# Patient Record
Sex: Female | Born: 1970 | Race: White | Hispanic: No | Marital: Married | Smoking: Current every day smoker
Health system: Southern US, Community
[De-identification: ages and names within clinical notes are randomized; demographics above are authoritative.]

## PROBLEM LIST (undated history)

## (undated) DIAGNOSIS — R569 Unspecified convulsions: Secondary | ICD-10-CM

## (undated) DIAGNOSIS — B019 Varicella without complication: Secondary | ICD-10-CM

## (undated) DIAGNOSIS — F32A Depression, unspecified: Secondary | ICD-10-CM

## (undated) DIAGNOSIS — I2699 Other pulmonary embolism without acute cor pulmonale: Secondary | ICD-10-CM

## (undated) DIAGNOSIS — F419 Anxiety disorder, unspecified: Secondary | ICD-10-CM

## (undated) DIAGNOSIS — I209 Angina pectoris, unspecified: Secondary | ICD-10-CM

## (undated) DIAGNOSIS — I10 Essential (primary) hypertension: Secondary | ICD-10-CM

## (undated) DIAGNOSIS — R197 Diarrhea, unspecified: Secondary | ICD-10-CM

## (undated) DIAGNOSIS — R0600 Dyspnea, unspecified: Secondary | ICD-10-CM

## (undated) DIAGNOSIS — R519 Headache, unspecified: Secondary | ICD-10-CM

## (undated) DIAGNOSIS — K760 Fatty (change of) liver, not elsewhere classified: Secondary | ICD-10-CM

## (undated) DIAGNOSIS — C801 Malignant (primary) neoplasm, unspecified: Secondary | ICD-10-CM

## (undated) DIAGNOSIS — E042 Nontoxic multinodular goiter: Secondary | ICD-10-CM

## (undated) DIAGNOSIS — R51 Headache: Secondary | ICD-10-CM

## (undated) DIAGNOSIS — F329 Major depressive disorder, single episode, unspecified: Secondary | ICD-10-CM

## (undated) DIAGNOSIS — K8012 Calculus of gallbladder with acute and chronic cholecystitis without obstruction: Secondary | ICD-10-CM

## (undated) DIAGNOSIS — K589 Irritable bowel syndrome without diarrhea: Secondary | ICD-10-CM

## (undated) HISTORY — DX: Depression, unspecified: F32.A

## (undated) HISTORY — DX: Headache: R51

## (undated) HISTORY — DX: Varicella without complication: B01.9

## (undated) HISTORY — DX: Headache, unspecified: R51.9

## (undated) HISTORY — DX: Major depressive disorder, single episode, unspecified: F32.9

## (undated) HISTORY — DX: Essential (primary) hypertension: I10

## (undated) HISTORY — PX: WISDOM TOOTH EXTRACTION: SHX21

## (undated) HISTORY — PX: MASTECTOMY: SHX3

---

## 2010-12-11 DIAGNOSIS — R202 Paresthesia of skin: Secondary | ICD-10-CM | POA: Insufficient documentation

## 2011-04-24 DIAGNOSIS — F419 Anxiety disorder, unspecified: Secondary | ICD-10-CM | POA: Insufficient documentation

## 2013-05-10 DIAGNOSIS — F410 Panic disorder [episodic paroxysmal anxiety] without agoraphobia: Secondary | ICD-10-CM | POA: Insufficient documentation

## 2013-05-10 LAB — CBC AND DIFFERENTIAL
HCT: 39 % (ref 36–46)
HCT: 39 % (ref 36–46)
HEMATOCRIT: 39 % (ref 36–46)
Hemoglobin: 13.3 g/dL (ref 12.0–16.0)
Hemoglobin: 13.3 g/dL (ref 12.0–16.0)
Hemoglobin: 13.3 g/dL (ref 12.0–16.0)
PLATELETS: 213 10*3/uL (ref 150–399)
Platelets: 213 10*3/uL (ref 150–399)
Platelets: 213 10*3/uL (ref 150–399)

## 2013-05-10 LAB — BASIC METABOLIC PANEL
BUN: 14 mg/dL (ref 4–21)
Creatinine: 0.8 mg/dL (ref 0.5–1.1)
Glucose: 74 mg/dL
Potassium: 4.5 mmol/L (ref 3.4–5.3)
Sodium: 142 mmol/L (ref 137–147)

## 2013-05-10 LAB — LIPID PANEL
LDl/HDL Ratio: 8.3
LDl/HDL Ratio: 8.3

## 2013-05-10 LAB — TSH: TSH: 3.89 u[IU]/mL (ref 0.41–5.90)

## 2013-05-10 LAB — HEPATIC FUNCTION PANEL
ALK PHOS: 49 U/L (ref 25–125)
ALT: 9 U/L (ref 7–35)
AST: 12 U/L — AB (ref 13–35)
Bilirubin, Total: 0.2 mg/dL

## 2013-05-11 DIAGNOSIS — Z8742 Personal history of other diseases of the female genital tract: Secondary | ICD-10-CM | POA: Insufficient documentation

## 2013-05-11 DIAGNOSIS — Z87898 Personal history of other specified conditions: Secondary | ICD-10-CM | POA: Insufficient documentation

## 2013-05-31 LAB — HM PAP SMEAR: HM PAP: NORMAL

## 2013-05-31 LAB — HM MAMMOGRAPHY: HM Mammogram: NORMAL

## 2013-06-29 DIAGNOSIS — N939 Abnormal uterine and vaginal bleeding, unspecified: Secondary | ICD-10-CM | POA: Insufficient documentation

## 2013-08-03 LAB — CBC AND DIFFERENTIAL
HEMATOCRIT: 46 % (ref 36–46)
Hemoglobin: 14.9 g/dL (ref 12.0–16.0)
PLATELETS: 236 10*3/uL (ref 150–399)
WBC: 9.4 10^3/mL

## 2013-08-03 LAB — BASIC METABOLIC PANEL
BUN: 11 mg/dL (ref 4–21)
Glucose: 63 mg/dL
POTASSIUM: 4.5 mmol/L (ref 3.4–5.3)
Sodium: 140 mmol/L (ref 137–147)

## 2013-08-03 LAB — HEPATIC FUNCTION PANEL
ALT: 50 U/L — AB (ref 7–35)
AST: 32 U/L (ref 13–35)
Alkaline Phosphatase: 57 U/L (ref 25–125)
Bilirubin, Total: 0.6 mg/dL

## 2013-08-03 LAB — LIPID PANEL
CHOLESTEROL: 168 mg/dL (ref 0–200)
HDL: 45 mg/dL (ref 35–70)
LDL CALC: 110 mg/dL
TRIGLYCERIDES: 67 mg/dL (ref 40–160)

## 2013-10-16 ENCOUNTER — Emergency Department: Payer: Self-pay | Admitting: Emergency Medicine

## 2014-06-01 ENCOUNTER — Telehealth: Payer: Self-pay | Admitting: Nurse Practitioner

## 2014-06-01 ENCOUNTER — Ambulatory Visit (INDEPENDENT_AMBULATORY_CARE_PROVIDER_SITE_OTHER): Payer: BLUE CROSS/BLUE SHIELD | Admitting: Nurse Practitioner

## 2014-06-01 ENCOUNTER — Encounter: Payer: Self-pay | Admitting: Nurse Practitioner

## 2014-06-01 ENCOUNTER — Encounter (INDEPENDENT_AMBULATORY_CARE_PROVIDER_SITE_OTHER): Payer: Self-pay

## 2014-06-01 VITALS — BP 142/90 | HR 61 | Temp 97.4°F | Resp 14 | Ht 68.0 in | Wt 209.1 lb

## 2014-06-01 DIAGNOSIS — I10 Essential (primary) hypertension: Secondary | ICD-10-CM | POA: Diagnosis not present

## 2014-06-01 DIAGNOSIS — Z720 Tobacco use: Secondary | ICD-10-CM | POA: Diagnosis not present

## 2014-06-01 DIAGNOSIS — F329 Major depressive disorder, single episode, unspecified: Secondary | ICD-10-CM | POA: Diagnosis not present

## 2014-06-01 DIAGNOSIS — F32A Depression, unspecified: Secondary | ICD-10-CM

## 2014-06-01 DIAGNOSIS — F17200 Nicotine dependence, unspecified, uncomplicated: Secondary | ICD-10-CM

## 2014-06-01 LAB — TSH: TSH: 1.89 u[IU]/mL (ref 0.35–4.50)

## 2014-06-01 LAB — COMPREHENSIVE METABOLIC PANEL
ALBUMIN: 4.3 g/dL (ref 3.5–5.2)
ALT: 15 U/L (ref 0–35)
AST: 15 U/L (ref 0–37)
Alkaline Phosphatase: 47 U/L (ref 39–117)
BUN: 7 mg/dL (ref 6–23)
CO2: 26 mEq/L (ref 19–32)
Calcium: 9.6 mg/dL (ref 8.4–10.5)
Chloride: 105 mEq/L (ref 96–112)
Creatinine, Ser: 0.83 mg/dL (ref 0.40–1.20)
GFR: 79.8 mL/min (ref >60.00)
Glucose, Bld: 81 mg/dL (ref 70–99)
Potassium: 4.1 mEq/L (ref 3.5–5.1)
Sodium: 136 mEq/L (ref 135–145)
TOTAL PROTEIN: 7 g/dL (ref 6.0–8.3)
Total Bilirubin: 0.4 mg/dL (ref 0.2–1.2)

## 2014-06-01 LAB — CBC WITH DIFFERENTIAL/PLATELET
BASOS PCT: 0.3 % (ref 0.0–3.0)
Basophils Absolute: 0 10*3/uL (ref 0.0–0.1)
EOS ABS: 0.1 10*3/uL (ref 0.0–0.7)
EOS PCT: 1.3 % (ref 0.0–5.0)
HCT: 40.8 % (ref 36.0–46.0)
HEMOGLOBIN: 13.9 g/dL (ref 12.0–15.0)
LYMPHS PCT: 27.4 % (ref 12.0–46.0)
Lymphs Abs: 2.2 10*3/uL (ref 0.7–4.0)
MCHC: 34.1 g/dL (ref 30.0–36.0)
MCV: 86.7 fl (ref 78.0–100.0)
MONO ABS: 0.5 10*3/uL (ref 0.1–1.0)
Monocytes Relative: 5.9 % (ref 3.0–12.0)
NEUTROS ABS: 5.1 10*3/uL (ref 1.4–7.7)
NEUTROS PCT: 65.1 % (ref 43.0–77.0)
Platelets: 228 10*3/uL (ref 150.0–400.0)
RBC: 4.7 Mil/uL (ref 3.87–5.11)
RDW: 14.3 % (ref 11.5–15.5)
WBC: 7.9 10*3/uL (ref 4.0–10.5)

## 2014-06-01 LAB — HEMOGLOBIN A1C: Hgb A1c MFr Bld: 5.3 % (ref 4.6–6.5)

## 2014-06-01 MED ORDER — NICOTINE POLACRILEX 4 MG MT GUM: 4.0000 mg | CHEWING_GUM | OROMUCOSAL | Status: DC | PRN

## 2014-06-01 MED ORDER — HYDROCHLOROTHIAZIDE 12.5 MG PO TABS: 12.5000 mg | ORAL_TABLET | Freq: Every day | ORAL | Status: DC

## 2014-06-01 NOTE — Progress Notes (Signed)
Pre visit review using our clinic review tool, if applicable. No additional management support is needed unless otherwise documented below in the visit note. 

## 2014-06-01 NOTE — Patient Instructions (Signed)
Nice to meet you. Welcome to [facility]!   We will follow up in 1 month.  Let me know if they give you a hard time about the gum to stop smoking.   Please visit the lab before leaving today.

## 2014-06-01 NOTE — Progress Notes (Signed)
Subjective:    Patient ID: Melissa Shepherd, female    DOB: May 30, 1970, 44 y.o.   MRN: [id number]  HPI  Melissa Shepherd is a 44 yo female establishing care and CC of HTN.   1) Health Maintenance-   Diet- Eats at home and out, drinking more water   Exercise- No formal   Immunizations- UTD  Mammogram- Last year pt reports, Keeping an eye on a cyst    MGM- Breast cancer  Paternal Aunt- breast cancer   Sister- Cervical cancer   Pap- 2015   Eye Exam- Not UTD  Dental Exam- UTD  2) Chronic Problems-  Depression- Psychiatrist- Latuda, Zoloft- depression, Clonazepam. Sees psych in [place], every 3 months.   Tobacco Use- Stopped during pregnancy, tried slowing down, not a candidate for   3) Acute Problems-  HTN- [facility]- 158/98, never had problems previously or been on medications.   Review of Systems  Constitutional: Negative for fever, chills, diaphoresis and fatigue.  HENT: Negative for tinnitus and trouble swallowing.   Eyes: Negative for visual disturbance.  Respiratory: Negative for chest tightness, shortness of breath and wheezing.   Cardiovascular: Negative for chest pain, palpitations and leg swelling.  Gastrointestinal: Negative for nausea, vomiting and diarrhea.  Genitourinary: Negative for difficulty urinating.  Musculoskeletal: Negative for back pain and neck pain.  Skin: Negative for rash.  Neurological: Negative for dizziness, weakness, numbness and headaches.  Psychiatric/Behavioral: Negative for suicidal ideas and sleep disturbance. The patient is not nervous/anxious.    Past Medical History  Diagnosis Date  . Depression   . Chicken pox   . Frequent headaches   . Hypertension   . Migraine     History   Social History  . Marital Status: Single    Spouse Name: N/A  . Number of Children: N/A  . Years of Education: N/A   Occupational History  . Not on file.   Social History Main Topics  . Smoking status: Current Every Day Smoker -- 1.00 packs/day    Types: Cigarettes  . Smokeless tobacco: Never Used  . Alcohol Use: No  . Drug Use: No  . Sexual Activity: Not Currently   Other Topics Concern  . 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 Cesarean section  1991. 2003.    2    Family History  Problem Relation Age of Onset  . Hypertension Mother   . Diabetes Son   . Cancer Maternal Grandmother     Breast  . Mental retardation Maternal Grandmother     No Known Allergies  No current outpatient prescriptions on file prior to visit.   No current facility-administered medications on file prior to visit.      Objective:   Physical Exam  Constitutional: She is oriented to person, place, and time. She appears well-developed and well-nourished. No distress.  BP 142/90 mmHg  Pulse 61  Temp(Src) 97.4 F (36.3 C) (Oral)  Resp 14  Ht 5\' 8"  (1.727 m)  Wt 209 lb 1.9 oz (94.856 kg)  BMI 31.80 kg/m2  SpO2 97%  LMP 05/27/2014 (Approximate)   HENT:  Head: Normocephalic and atraumatic.  Right Ear: External ear normal.  Left Ear: External ear normal.  Cardiovascular: Normal rate, regular rhythm, normal heart sounds and intact distal pulses.  Exam reveals no gallop and no friction rub.   No murmur heard. Pulmonary/Chest: Effort normal and breath sounds normal. No respiratory distress. She has no wheezes. She has no rales. She exhibits no tenderness.  Neurological: She is alert and oriented to person, place, and time. No cranial nerve deficit. She exhibits normal muscle tone. Coordination normal.  Skin: Skin is warm and dry. No rash noted. She is not diaphoretic.  Psychiatric: She has a normal mood and affect. Her behavior is normal. Judgment and thought content normal.        Assessment & Plan:

## 2014-06-01 NOTE — Telephone Encounter (Signed)
emmi mailed  °

## 2014-06-02 ENCOUNTER — Telehealth: Payer: Self-pay | Admitting: Nurse Practitioner

## 2014-06-02 NOTE — Telephone Encounter (Signed)
emmi emailed °

## 2014-06-11 DIAGNOSIS — I10 Essential (primary) hypertension: Secondary | ICD-10-CM | POA: Insufficient documentation

## 2014-06-11 DIAGNOSIS — F17200 Nicotine dependence, unspecified, uncomplicated: Secondary | ICD-10-CM | POA: Insufficient documentation

## 2014-06-11 DIAGNOSIS — F329 Major depressive disorder, single episode, unspecified: Secondary | ICD-10-CM | POA: Insufficient documentation

## 2014-06-11 DIAGNOSIS — F32A Depression, unspecified: Secondary | ICD-10-CM | POA: Insufficient documentation

## 2014-06-11 NOTE — Assessment & Plan Note (Addendum)
Will start on hctz 12.5 mg daily. FU in 1 month. Will obtain CMET baseline. Since she is a new pt will obtain cbc w/ diff, a1c and tsh.

## 2014-06-11 NOTE — Assessment & Plan Note (Signed)
Continue on Latuda, Klonopin, and zoloft. Stable and seeing psychiatry in [facility]

## 2014-06-11 NOTE — Assessment & Plan Note (Signed)
Pt wants to try the gum. I will send it in and asked her if she has any issues to call. Unsure if insurance will cover.

## 2014-06-14 ENCOUNTER — Encounter: Payer: Self-pay | Admitting: Nurse Practitioner

## 2014-06-29 ENCOUNTER — Telehealth: Payer: Self-pay | Admitting: *Deleted

## 2014-06-29 ENCOUNTER — Ambulatory Visit: Payer: BLUE CROSS/BLUE SHIELD | Admitting: Nurse Practitioner

## 2014-06-29 DIAGNOSIS — Z0289 Encounter for other administrative examinations: Secondary | ICD-10-CM

## 2014-06-29 NOTE — Telephone Encounter (Signed)
Okay to schedule pt tomorrow

## 2014-06-29 NOTE — Telephone Encounter (Signed)
Pt scheduled and notified

## 2014-06-30 ENCOUNTER — Ambulatory Visit (INDEPENDENT_AMBULATORY_CARE_PROVIDER_SITE_OTHER): Payer: BLUE CROSS/BLUE SHIELD | Admitting: Nurse Practitioner

## 2014-06-30 ENCOUNTER — Encounter: Payer: Self-pay | Admitting: Nurse Practitioner

## 2014-06-30 VITALS — BP 122/78 | HR 67 | Temp 97.7°F | Resp 14 | Ht 68.0 in | Wt 211.4 lb

## 2014-06-30 DIAGNOSIS — F329 Major depressive disorder, single episode, unspecified: Secondary | ICD-10-CM | POA: Diagnosis not present

## 2014-06-30 DIAGNOSIS — R079 Chest pain, unspecified: Secondary | ICD-10-CM | POA: Diagnosis not present

## 2014-06-30 DIAGNOSIS — I10 Essential (primary) hypertension: Secondary | ICD-10-CM

## 2014-06-30 DIAGNOSIS — M7989 Other specified soft tissue disorders: Secondary | ICD-10-CM | POA: Diagnosis not present

## 2014-06-30 DIAGNOSIS — F32A Depression, unspecified: Secondary | ICD-10-CM

## 2014-06-30 MED ORDER — FUROSEMIDE 20 MG PO TABS: 20.0000 mg | ORAL_TABLET | Freq: Every day | ORAL | Status: DC

## 2014-06-30 MED ORDER — NICOTINE 21 MG/24HR TD PT24: 21.0000 mg | MEDICATED_PATCH | Freq: Every day | TRANSDERMAL | Status: DC

## 2014-06-30 NOTE — Patient Instructions (Signed)
Follow up in 3 months. Take lasix every 3rd day (Thurs, skip Fri and Sat, take Sun, skip mon and tue take wed ect.Marland KitchenMarland Kitchen

## 2014-06-30 NOTE — Progress Notes (Signed)
   Subjective:    Patient ID: Melissa Shepherd, female    DOB: Jul 25, 1970, 44 y.o.   MRN: [id number]  HPI  Melissa Shepherd is a 44 yo female with a follow up for HTN, depression, tobacco use, and SOB.   1) HTN- good highest 147/88 at home 2) Depression- doing well  3) Tobacco use  Chest pain- felt like a band of pain across ribs Friday and Saturday camping and 1 episode end of March First time 15 min, Friday 30 min, Sat 30 min  Review of Systems  Constitutional: Negative for fever, chills, diaphoresis and fatigue.  Respiratory: Negative for chest tightness, shortness of breath and wheezing.   Cardiovascular: Positive for chest pain. Negative for palpitations and leg swelling.       Intermittent 2 episodes  Gastrointestinal: Negative for nausea, vomiting and diarrhea.  Skin: Negative for rash.  Neurological: Negative for dizziness, weakness, numbness and headaches.  Psychiatric/Behavioral: The patient is not nervous/anxious.        Objective:   Physical Exam  Constitutional: She is oriented to person, place, and time. She appears well-developed and well-nourished. No distress.  BP 122/78 mmHg  Pulse 67  Temp(Src) 97.7 F (36.5 C) (Oral)  Resp 14  Ht 5\' 8"  (1.727 m)  Wt 211 lb 6.4 oz (95.89 kg)  BMI 32.15 kg/m2  SpO2 98%  LMP 05/27/2014 (Approximate)   HENT:  Head: Normocephalic and atraumatic.  Right Ear: External ear normal.  Left Ear: External ear normal.  Cardiovascular: Normal rate, regular rhythm, normal heart sounds and intact distal pulses.  Exam reveals no gallop and no friction rub.   No murmur heard. Pulmonary/Chest: Effort normal and breath sounds normal. No respiratory distress. She has no wheezes. She has no rales. She exhibits no tenderness.  Neurological: She is alert and oriented to person, place, and time. No cranial nerve deficit. She exhibits normal muscle tone. Coordination normal.  Skin: Skin is warm and dry. No rash noted. She is not diaphoretic.    Psychiatric: She has a normal mood and affect. Her behavior is normal. Judgment and thought content normal.      Assessment & Plan:

## 2014-06-30 NOTE — Progress Notes (Signed)
Pre visit review using our clinic review tool, if applicable. No additional management support is needed unless otherwise documented below in the visit note. 

## 2014-07-08 ENCOUNTER — Ambulatory Visit: Payer: BLUE CROSS/BLUE SHIELD | Admitting: Nurse Practitioner

## 2014-07-09 DIAGNOSIS — R079 Chest pain, unspecified: Secondary | ICD-10-CM | POA: Insufficient documentation

## 2014-07-09 DIAGNOSIS — M7989 Other specified soft tissue disorders: Secondary | ICD-10-CM | POA: Insufficient documentation

## 2014-07-09 NOTE — Assessment & Plan Note (Signed)
Stable. Continue medication regimen.

## 2014-07-09 NOTE — Assessment & Plan Note (Signed)
Stable. Continue regimen. 

## 2014-07-09 NOTE — Assessment & Plan Note (Signed)
Try Lasix 20 mg Tuesdays and Thursdays. Pt did not want to take every 3rd day.

## 2014-07-09 NOTE — Assessment & Plan Note (Signed)
Referring to Cardiology for further work up.

## 2014-07-27 ENCOUNTER — Other Ambulatory Visit: Payer: Self-pay | Admitting: Nurse Practitioner

## 2014-08-02 ENCOUNTER — Ambulatory Visit: Payer: BLUE CROSS/BLUE SHIELD | Admitting: Cardiovascular Disease

## 2014-09-02 ENCOUNTER — Encounter (INDEPENDENT_AMBULATORY_CARE_PROVIDER_SITE_OTHER): Payer: Self-pay

## 2014-09-02 ENCOUNTER — Ambulatory Visit (INDEPENDENT_AMBULATORY_CARE_PROVIDER_SITE_OTHER): Payer: BLUE CROSS/BLUE SHIELD | Admitting: Cardiovascular Disease

## 2014-09-02 ENCOUNTER — Encounter: Payer: Self-pay | Admitting: Cardiovascular Disease

## 2014-09-02 VITALS — BP 118/88 | HR 68 | Ht 67.0 in | Wt 211.0 lb

## 2014-09-02 DIAGNOSIS — R0602 Shortness of breath: Secondary | ICD-10-CM | POA: Diagnosis not present

## 2014-09-02 DIAGNOSIS — R079 Chest pain, unspecified: Secondary | ICD-10-CM

## 2014-09-02 DIAGNOSIS — I10 Essential (primary) hypertension: Secondary | ICD-10-CM

## 2014-09-02 NOTE — Progress Notes (Signed)
HPI  This is a 44 year old Caucasian female who was referred for evaluation of chest pain. She has no previous cardiac history. She has known history of recurrent chest pain. She underwent previous cardiac evaluation at [facility]. She had a stress echocardiogram in 2012 which was normal. He continued to have chest pain and underwent CT angiogram of the coronary arteries in 2013 which showed normal coronary arteries with a calcium score of 0. She has chronic medical conditions that include hypertension which required treatment recently with hydrochlorothiazide, tobacco use since the age of 56, GERD, anxiety and previous panic attacks. She experienced 5 episodes of chest pain recently. All of them happened at rest. It felt like a heavy band around her breasts. It was associated with shortness of breath. There was no radiation. Most of these episodes last anywhere from 10-30 minutes. She feels very anxious during these episodes. There is no family history of premature coronary artery disease.  No Known Allergies   Current Outpatient Prescriptions on File Prior to Visit  Medication Sig Dispense Refill  . clonazePAM (KLONOPIN) 0.5 MG tablet Take 0.5 mg by mouth. As needed.  No more than 4 times daily.  1  . furosemide (LASIX) 20 MG tablet Take 1 tablet (20 mg total) by mouth daily. (Patient taking differently: Take 20 mg by mouth 2 (two) times a week. ) 30 tablet 0  . hydrochlorothiazide (HYDRODIURIL) 12.5 MG tablet TAKE 1 TABLET (12.5 MG TOTAL) BY MOUTH DAILY. 30 tablet 5  . sertraline (ZOLOFT) 50 MG tablet Take 50 mg by mouth daily.     No current facility-administered medications on file prior to visit.     Past Medical History  Diagnosis Date  . Depression   . Chicken pox   . Frequent headaches   . Hypertension   . Migraine      Past Surgical History  Procedure Laterality Date  . Cesarean section  1991. 2003.    2     Family History  Problem Relation Age of Onset    . Hypertension Mother   . Diabetes Son   . Cancer Maternal Grandmother     Breast  . Mental retardation Maternal Grandmother      History   Social History  . Marital Status: Single    Spouse Name: N/A  . Number of Children: N/A  . Years of Education: N/A   Occupational History  . Not on file.   Social History Main Topics  . Smoking status: Current Every Day Smoker -- 1.00 packs/day    Types: Cigarettes  . Smokeless tobacco: Never Used  . Alcohol Use: 0.0 oz/week    0 Standard drinks or equivalent per week  . Drug Use: No  . Sexual Activity: Not Currently   Other Topics Concern  . 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 It is negative other than that mentioned in the history of present illness.    PHYSICAL EXAM   BP 118/88 mmHg  Pulse 68  Ht 5\' 7"  (1.702 m)  Wt 211 lb (95.709 kg)  BMI 33.04 kg/m2 Constitutional: She is oriented to person, place,  and time. She appears well-developed and well-nourished. No distress.  HENT: No nasal discharge.  Head: Normocephalic and atraumatic.  Eyes: Pupils are equal and round. No discharge.  Neck: Normal range of motion. Neck supple. No JVD present. No thyromegaly present.  Cardiovascular: Normal rate, regular rhythm, normal heart sounds. Exam reveals no gallop and no friction rub. No murmur heard.  Pulmonary/Chest: Effort normal and breath sounds normal. No stridor. No respiratory distress. She has no wheezes. She has no rales. She exhibits no tenderness.  Abdominal: Soft. Bowel sounds are normal. She exhibits no distension. There is no tenderness. There is no rebound and no guarding.  Musculoskeletal: Normal range of motion. She exhibits no edema and no tenderness.  Neurological: She is  alert and oriented to person, place, and time. Coordination normal.  Skin: Skin is warm and dry. No rash noted. She is not diaphoretic. No erythema. No pallor.  Psychiatric: She has a normal mood and affect. Her behavior is normal. Judgment and thought content normal.     EKG: Sinus  Rhythm  Low voltage in precordial leads.   ABNORMAL    ASSESSMENT AND PLAN

## 2014-09-02 NOTE — Assessment & Plan Note (Signed)
Blood pressure is controlled on hydrochlorothiazide. 

## 2014-09-02 NOTE — Assessment & Plan Note (Signed)
The chest pain is overall atypical and likely due to stress and anxiety. She had previous cardiac workup most recently in 2013 at [place] as outlined above. Thus, the chance of progression in his is very low. Nonetheless, she has risk factors that include hypertension and tobacco use. Thus, I requested a treadmill stress test for evaluation. If this comes back unremarkable, no further cardiac workup is recommended.

## 2014-09-02 NOTE — Patient Instructions (Signed)
Medication Instructions:  Your physician recommends that you continue on your current medications as directed. Please refer to the Current Medication list given to you today.   Labwork: none  Testing/Procedures: Your physician has requested that you have an exercise tolerance test.     Follow-Up: Your physician recommends that you schedule a follow-up appointment with Dr. Fletcher Anon as needed.    Any Other Special Instructions Will Be Listed Below (If Applicable).  Exercise Stress Electrocardiogram An exercise stress electrocardiogram is a test to check how blood flows to your heart. It is done to find areas of poor blood flow. You will need to walk on a treadmill for this test. The electrocardiogram will record your heartbeat when you are at rest and when you are exercising. BEFORE THE PROCEDURE  Do not have drinks with caffeine or foods with caffeine for 24 hours before the test, or as told by your doctor. This includes coffee, tea (even decaf tea), sodas, chocolate, and cocoa.  Follow your doctor's instructions about eating and drinking before the test.  Ask your doctor what medicines you should or should not take before the test. Take your medicines with water unless told by your doctor not to.  If you use an inhaler, bring it with you to the test.  Bring a snack to eat after the test.  Do not  smoke for 4 hours before the test.  Do not put lotions, powders, creams, or oils on your chest before the test.  Wear comfortable shoes and clothing. PROCEDURE  You will have patches put on your chest. Small areas of your chest may need to be shaved. Wires will be connected to the patches.  Your heart rate will be watched while you are resting and while you are exercising.  You will walk on the treadmill. The treadmill will slowly get faster to raise your heart rate.  The test will take about 1-2 hours. AFTER THE PROCEDURE  Your heart rate and blood pressure will be watched after  the test.  You may return to your normal diet, activities, and medicines or as told by your doctor. Document Released: 07/31/2007 Document Revised: 06/28/2013 Document Reviewed: 10/19/2012 [facility] Patient Information 2015 [place], [place]. This information is not intended to replace advice given to you by your health care provider. Make sure you discuss any questions you have with your health care provider.

## 2014-09-08 ENCOUNTER — Ambulatory Visit (INDEPENDENT_AMBULATORY_CARE_PROVIDER_SITE_OTHER): Payer: BLUE CROSS/BLUE SHIELD

## 2014-09-08 DIAGNOSIS — R0602 Shortness of breath: Secondary | ICD-10-CM | POA: Diagnosis not present

## 2014-09-08 NOTE — Patient Instructions (Signed)
Your stress was normal  Call or return to clinic prn if these symptoms worsen or fail to improve as anticipated.

## 2014-09-09 LAB — EXERCISE TOLERANCE TEST
Estimated workload: 7.2 METS
Exercise duration (min): 6 min
Exercise duration (sec): 9 s
Peak HR: 146 {beats}/min
Percent HR: 82 %
Rest HR: 88 {beats}/min

## 2014-09-30 ENCOUNTER — Telehealth: Payer: Self-pay | Admitting: Nurse Practitioner

## 2014-09-30 ENCOUNTER — Ambulatory Visit (INDEPENDENT_AMBULATORY_CARE_PROVIDER_SITE_OTHER): Payer: BLUE CROSS/BLUE SHIELD | Admitting: Nurse Practitioner

## 2014-09-30 VITALS — BP 122/80 | HR 61 | Temp 98.4°F | Resp 14 | Ht 67.0 in | Wt 213.2 lb

## 2014-09-30 DIAGNOSIS — M545 Low back pain: Secondary | ICD-10-CM

## 2014-09-30 DIAGNOSIS — I10 Essential (primary) hypertension: Secondary | ICD-10-CM | POA: Diagnosis not present

## 2014-09-30 DIAGNOSIS — M7989 Other specified soft tissue disorders: Secondary | ICD-10-CM | POA: Diagnosis not present

## 2014-09-30 DIAGNOSIS — M546 Pain in thoracic spine: Secondary | ICD-10-CM | POA: Diagnosis not present

## 2014-09-30 MED ORDER — FUROSEMIDE 20 MG PO TABS: 20.0000 mg | ORAL_TABLET | ORAL | Status: DC

## 2014-09-30 NOTE — Progress Notes (Signed)
Pre visit review using our clinic review tool, if applicable. No additional management support is needed unless otherwise documented below in the visit note. 

## 2014-09-30 NOTE — Telephone Encounter (Signed)
Pt called to cancel appt for today. Pt forgot about appt and is currently out of town. Please advise to cancel appt/msn

## 2014-09-30 NOTE — Telephone Encounter (Signed)
Pt ended up coming in for her appt

## 2014-09-30 NOTE — Progress Notes (Signed)
'Patient ID: Melissa Shepherd, female    DOB: [birth year]  Age: 44 y.o. MRN: [id number]  CC: Follow-up   HPI Andria Head presents for a follow up of HTN and back pain.   1) Not watching salt intake, not taking lasix recently, pt reports the medication is working for her. She reports that   2) Pt quickly mentioned back pain before finishing our visit. She reports she has mid to low back pain, bilaterally, denies sciatica.   History Meriem has a past medical history of Depression; Chicken pox; Frequent headaches; Hypertension; and Migraine.   She has past surgical history that includes Cesarean section (1991. 2003.).   Her family history includes Cancer in her maternal grandmother; Diabetes in her son; Hypertension in her mother; Mental retardation in her maternal grandmother.She reports that she has been smoking Cigarettes.  She has been smoking about 1.00 pack per day. She has never used smokeless tobacco. She reports that she drinks alcohol. She reports that she does not use illicit drugs.  Outpatient Prescriptions Prior to Visit  Medication Sig Dispense Refill  . clonazePAM (KLONOPIN) 0.5 MG tablet Take 0.5 mg by mouth. As needed.  No more than 4 times daily.  1  . hydrochlorothiazide (HYDRODIURIL) 12.5 MG tablet TAKE 1 TABLET (12.5 MG TOTAL) BY MOUTH DAILY. 30 tablet 5  . Lurasidone HCl (LATUDA) 60 MG TABS Take by mouth daily.    . sertraline (ZOLOFT) 50 MG tablet Take 50 mg by mouth daily.    . furosemide (LASIX) 20 MG tablet Take 1 tablet (20 mg total) by mouth daily. (Patient taking differently: Take 20 mg by mouth 2 (two) times a week. ) 30 tablet 0   No facility-administered medications prior to visit.    ROS Review of Systems  Constitutional: Negative for fever, chills, diaphoresis and fatigue.  Respiratory: Negative for chest tightness, shortness of breath and wheezing.   Cardiovascular: Negative for chest pain, palpitations and leg swelling.  Gastrointestinal: Negative for  nausea, vomiting and diarrhea.  Musculoskeletal: Positive for back pain.  Skin: Negative for rash.  Neurological: Negative for dizziness, weakness, numbness and headaches.       Denies losing bowel/bladder function, saddle anesthesia, or weakness of extremities.   Psychiatric/Behavioral: The patient is not nervous/anxious.     Objective:  BP 122/80 mmHg  Pulse 61  Temp(Src) 98.4 F (36.9 C)  Resp 14  Ht 5\' 7"  (1.702 m)  Wt 213 lb 3.2 oz (96.707 kg)  BMI 33.38 kg/m2  SpO2 97%  Physical Exam  Constitutional: She is oriented to person, place, and time. She appears well-developed and well-nourished. No distress.  HENT:  Head: Normocephalic and atraumatic.  Right Ear: External ear normal.  Left Ear: External ear normal.  Cardiovascular: Normal rate, regular rhythm and normal heart sounds.  Exam reveals no gallop and no friction rub.   No murmur heard. Pulmonary/Chest: Effort normal and breath sounds normal. No respiratory distress. She has no wheezes. She has no rales. She exhibits no tenderness.  Musculoskeletal: Normal range of motion. She exhibits tenderness. She exhibits no edema.  Tender mid to low back paraspinal muscles. Right worse than left in tightness  Neurological: She is alert and oriented to person, place, and time. No cranial nerve deficit. She exhibits normal muscle tone. Coordination normal.  Skin: Skin is warm and dry. No rash noted. She is not diaphoretic.  Psychiatric: She has a normal mood and affect. Her behavior is normal. Judgment and thought content normal.  Assessment & Plan:   Nel was seen today for follow-up.  Diagnoses and all orders for this visit:  Bilateral low back pain, with sciatica presence unspecified -     DG Lumbar Spine 2-3 Views; Future  Bilateral thoracic back pain -     DG Thoracic Spine 2 View; Future  Other orders -     furosemide (LASIX) 20 MG tablet; Take 1 tablet (20 mg total) by mouth 2 (two) times a week.   I have  changed Ms. Avetisyan's furosemide. I am also having her maintain her sertraline, clonazePAM, hydrochlorothiazide, and Lurasidone HCl.  Meds ordered this encounter  Medications  . furosemide (LASIX) 20 MG tablet    Sig: Take 1 tablet (20 mg total) by mouth 2 (two) times a week.    Dispense:  30 tablet    Refill:  0    Order Specific Question:  Supervising Provider    Answer:  Crecencio Mc [[id number]]     Follow-up: Return in about 4 weeks (around 10/28/2014) for Back pain .

## 2014-09-30 NOTE — Patient Instructions (Signed)
Your Lasix was refilled.   Your back x-rays were ordered for [facility].   [place] at [facility] Address:  Address: [address], [place], [place] [address]  [phone] X-rays until 4 pm. You can go as your schedule allows.   Back Pain, Adult Low back pain is very common. About 1 in 5 people have back pain.The cause of low back pain is rarely dangerous. The pain often gets better over time.About half of people with a sudden onset of back pain feel better in just 2 weeks. About 8 in 10 people feel better by 6 weeks.  CAUSES Some common causes of back pain include:  Strain of the muscles or ligaments supporting the spine.  Wear and tear (degeneration) of the spinal discs.  Arthritis.  Direct injury to the back. DIAGNOSIS Most of the time, the direct cause of low back pain is not known.However, back pain can be treated effectively even when the exact cause of the pain is unknown.Answering your caregiver's questions about your overall health and symptoms is one of the most accurate ways to make sure the cause of your pain is not dangerous. If your caregiver needs more information, he or she may order lab work or imaging tests (X-rays or MRIs).However, even if imaging tests show changes in your back, this usually does not require surgery. HOME CARE INSTRUCTIONS For many people, back pain returns.Since low back pain is rarely dangerous, it is often a condition that people can learn to [facility] their own.   Remain active. It is stressful on the back to sit or stand in one place. Do not sit, drive, or stand in one place for more than 30 minutes at a time. Take short walks on level surfaces as soon as pain allows.Try to increase the length of time you walk each day.  Do not stay in bed.Resting more than 1 or 2 days can delay your recovery.  Do not avoid exercise or work.Your body is made to move.It is not dangerous to be active, even though your  back may hurt.Your back will likely heal faster if you return to being active before your pain is gone.  Pay attention to your body when you bend and lift. Many people have less discomfortwhen lifting if they bend their knees, keep the load close to their bodies,and avoid twisting. Often, the most comfortable positions are those that put less stress on your recovering back.  Find a comfortable position to sleep. Use a firm mattress and lie on your side with your knees slightly bent. If you lie on your back, put a pillow under your knees.  Only take over-the-counter or prescription medicines as directed by your caregiver. Over-the-counter medicines to reduce pain and inflammation are often the most helpful.Your caregiver may prescribe muscle relaxant drugs.These medicines help dull your pain so you can more quickly return to your normal activities and healthy exercise.  Put ice on the injured area.  Put ice in a plastic bag.  Place a towel between your skin and the bag.  Leave the ice on for 15-20 minutes, 03-04 times a day for the first 2 to 3 days. After that, ice and heat may be alternated to reduce pain and spasms.  Ask your caregiver about trying back exercises and gentle massage. This may be of some benefit.  Avoid feeling anxious or stressed.Stress increases muscle tension and can worsen back pain.It is important to recognize when you are anxious or stressed  and learn ways to manage it.Exercise is a great option. SEEK MEDICAL CARE IF:  You have pain that is not relieved with rest or medicine.  You have pain that does not improve in 1 week.  You have new symptoms.  You are generally not feeling well. SEEK IMMEDIATE MEDICAL CARE IF:   You have pain that radiates from your back into your legs.  You develop new bowel or bladder control problems.  You have unusual weakness or numbness in your arms or legs.  You develop nausea or vomiting.  You develop abdominal  pain.  You feel faint. Document Released: 02/11/2005 Document Revised: 08/13/2011 Document Reviewed: 06/15/2013 [facility] Patient Information 2015 [place], [place]. This information is not intended to replace advice given to you by your health care provider. Make sure you discuss any questions you have with your health care provider.

## 2014-10-06 ENCOUNTER — Encounter: Payer: Self-pay | Admitting: Nurse Practitioner

## 2014-10-06 DIAGNOSIS — M545 Low back pain, unspecified: Secondary | ICD-10-CM | POA: Insufficient documentation

## 2014-10-06 DIAGNOSIS — M546 Pain in thoracic spine: Secondary | ICD-10-CM | POA: Insufficient documentation

## 2014-10-06 NOTE — Assessment & Plan Note (Signed)
Pt would like refill of lasix. Taking twice weekly if she remembers.

## 2014-10-06 NOTE — Assessment & Plan Note (Addendum)
Pt was very adamant about getting x-rays. Pt reports her pain goes up to her bra strap approx. She was tender to palpation, no decrease in ROM. Time constraints limited our exam time today. Will follow up in 4 weeks or after x-rays.

## 2014-10-06 NOTE — Assessment & Plan Note (Signed)
BP Readings from Last 3 Encounters:  09/30/14 122/80  09/02/14 118/88  06/30/14 122/78   Controlled. Sees Dr. Fletcher Anon.

## 2014-10-06 NOTE — Assessment & Plan Note (Signed)
Pt very adamant about getting x-rays. She reports this has been a big issue in the past with her back. She reports bulging discs in the past. Will obtain thoracic and lumbar x-rays. Will follow.

## 2014-10-24 ENCOUNTER — Ambulatory Visit (INDEPENDENT_AMBULATORY_CARE_PROVIDER_SITE_OTHER)
Admission: RE | Admit: 2014-10-24 | Discharge: 2014-10-24 | Disposition: A | Discharge status: Home / Self Care | Payer: BLUE CROSS/BLUE SHIELD | Source: Ambulatory Visit | Attending: Nurse Practitioner | Admitting: Nurse Practitioner

## 2014-10-24 DIAGNOSIS — M545 Low back pain: Secondary | ICD-10-CM | POA: Diagnosis not present

## 2014-10-24 DIAGNOSIS — M546 Pain in thoracic spine: Secondary | ICD-10-CM

## 2014-10-26 ENCOUNTER — Other Ambulatory Visit: Payer: Self-pay | Admitting: Nurse Practitioner

## 2014-10-26 DIAGNOSIS — M546 Pain in thoracic spine: Secondary | ICD-10-CM

## 2014-11-03 ENCOUNTER — Other Ambulatory Visit: Payer: BLUE CROSS/BLUE SHIELD

## 2014-11-04 ENCOUNTER — Ambulatory Visit: Admission: RE | Admit: 2014-11-04 | Payer: BLUE CROSS/BLUE SHIELD | Source: Ambulatory Visit

## 2015-01-24 ENCOUNTER — Other Ambulatory Visit: Payer: Self-pay | Admitting: Nurse Practitioner

## 2015-04-26 ENCOUNTER — Encounter: Payer: Self-pay | Admitting: Nurse Practitioner

## 2015-04-26 ENCOUNTER — Ambulatory Visit (INDEPENDENT_AMBULATORY_CARE_PROVIDER_SITE_OTHER): Payer: BLUE CROSS/BLUE SHIELD | Admitting: Nurse Practitioner

## 2015-04-26 VITALS — BP 122/80 | HR 56 | Temp 98.0°F | Ht 67.0 in | Wt 226.8 lb

## 2015-04-26 DIAGNOSIS — I10 Essential (primary) hypertension: Secondary | ICD-10-CM | POA: Diagnosis not present

## 2015-04-26 DIAGNOSIS — M7989 Other specified soft tissue disorders: Secondary | ICD-10-CM

## 2015-04-26 DIAGNOSIS — K589 Irritable bowel syndrome without diarrhea: Secondary | ICD-10-CM

## 2015-04-26 LAB — CBC WITH DIFFERENTIAL/PLATELET
Basophils Absolute: 0.1 10*3/uL (ref 0.0–0.1)
Basophils Relative: 1.4 % (ref 0.0–3.0)
Eosinophils Absolute: 0.1 10*3/uL (ref 0.0–0.7)
Eosinophils Relative: 1.3 % (ref 0.0–5.0)
HEMATOCRIT: 38.9 % (ref 36.0–46.0)
HEMOGLOBIN: 13.3 g/dL (ref 12.0–15.0)
LYMPHS ABS: 2.2 10*3/uL (ref 0.7–4.0)
Lymphocytes Relative: 32.1 % (ref 12.0–46.0)
MCHC: 34.3 g/dL (ref 30.0–36.0)
MCV: 89.1 fl (ref 78.0–100.0)
Monocytes Absolute: 0.5 10*3/uL (ref 0.1–1.0)
Monocytes Relative: 7.2 % (ref 3.0–12.0)
Neutro Abs: 4 10*3/uL (ref 1.4–7.7)
Neutrophils Relative %: 58 % (ref 43.0–77.0)
Platelets: 217 10*3/uL (ref 150.0–400.0)
RBC: 4.37 Mil/uL (ref 3.87–5.11)
RDW: 15.9 % — AB (ref 11.5–15.5)
WBC: 6.9 10*3/uL (ref 4.0–10.5)

## 2015-04-26 LAB — COMPREHENSIVE METABOLIC PANEL
ALK PHOS: 44 U/L (ref 39–117)
ALT: 24 U/L (ref 0–35)
AST: 20 U/L (ref 0–37)
Albumin: 4.5 g/dL (ref 3.5–5.2)
BUN: 7 mg/dL (ref 6–23)
CO2: 26 mEq/L (ref 19–32)
Calcium: 9.3 mg/dL (ref 8.4–10.5)
Chloride: 105 mEq/L (ref 96–112)
Creatinine, Ser: 0.77 mg/dL (ref 0.40–1.20)
GFR: 86.65 mL/min (ref >60.00)
Glucose, Bld: 83 mg/dL (ref 70–99)
POTASSIUM: 3.6 meq/L (ref 3.5–5.1)
SODIUM: 139 meq/L (ref 135–145)
TOTAL PROTEIN: 6.9 g/dL (ref 6.0–8.3)
Total Bilirubin: 0.3 mg/dL (ref 0.2–1.2)

## 2015-04-26 LAB — LIPID PANEL
CHOLESTEROL: 227 mg/dL — AB (ref 0–200)
HDL: 51.6 mg/dL (ref >39.00)
NONHDL: 174.93
TRIGLYCERIDES: 206 mg/dL — AB (ref 0.0–149.0)
Total CHOL/HDL Ratio: 4
VLDL: 41.2 mg/dL — ABNORMAL HIGH (ref 0.0–40.0)

## 2015-04-26 LAB — HEMOGLOBIN A1C: HEMOGLOBIN A1C: 5.4 % (ref 4.6–6.5)

## 2015-04-26 LAB — LDL CHOLESTEROL, DIRECT: Direct LDL: 147 mg/dL

## 2015-04-26 NOTE — Patient Instructions (Addendum)
Please follow up with the nutritionist when you get scheduled.   Work on diet modification  Get Zantac 150 mg daily over the counter and take once in the mornings with the imodium AD.

## 2015-04-26 NOTE — Progress Notes (Signed)
Patient ID: Roger Vitorino, female    DOB: 19-Oct-1970  Age: 45 y.o. MRN: [id number]  CC: Acute Visit   HPI Shuntia Copas presents for CC of BP issues, weight concern, and IBS.   1) IBS- Self diagnosed  Diarrhea- since she was young  Has go to suddenly and sometimes doesn't make it  1 month worsening  5 x this month sudden onset of diarrhea, loose Maternal Aunt- has same issues   Does not wake up out of sleep to do this  Caffeine- coffee in the morning, 1 soda daily  Imodium- has not used   2) BP- issues 149/88 only when going to drug stores  Home BP cuff broke  Takes BP medication in the morning   Alarm symptoms: Wt loss, rectal bleeding, anemia, family h/o celiac disease, and nocturnal symptoms DENIES ALL  1) 7 mos 13 lbs up  Pt is drinking mountain dew  Eating baked ziti, chex mix, and eats usually once daily with snacking in between.  Pt feels like her legs swell bilaterally from knee down  History Noleen has a past medical history of Depression; Chicken pox; Frequent headaches; Hypertension; and Migraine.   She has past surgical history that includes Cesarean section (1991. 2003.).   Her family history includes Cancer in her maternal grandmother; Diabetes in her son; Hypertension in her mother; Mental retardation in her maternal grandmother.She reports that she has been smoking Cigarettes.  She has been smoking about 1.00 pack per day. She has never used smokeless tobacco. She reports that she drinks alcohol. She reports that she does not use illicit drugs.  Outpatient Prescriptions Prior to Visit  Medication Sig Dispense Refill  . clonazePAM (KLONOPIN) 0.5 MG tablet Take 0.5 mg by mouth. As needed.  No more than 4 times daily.  1  . hydrochlorothiazide (HYDRODIURIL) 12.5 MG tablet TAKE 1 TABLET (12.5 MG TOTAL) BY MOUTH DAILY. 30 tablet 5  . Lurasidone HCl (LATUDA) 60 MG TABS Take by mouth daily.    . sertraline (ZOLOFT) 50 MG tablet Take 50 mg by mouth daily.    . furosemide  (LASIX) 20 MG tablet Take 1 tablet (20 mg total) by mouth 2 (two) times a week. 30 tablet 0   No facility-administered medications prior to visit.    ROS Review of Systems  Constitutional: Negative for fever, chills, diaphoresis and fatigue.  Respiratory: Negative for chest tightness, shortness of breath and wheezing.   Cardiovascular: Negative for chest pain, palpitations and leg swelling.  Gastrointestinal: Positive for diarrhea. Negative for nausea, vomiting, abdominal pain and abdominal distention.  Skin: Negative for rash.  Neurological: Negative for dizziness, weakness, numbness and headaches.  Psychiatric/Behavioral: Negative for suicidal ideas and sleep disturbance. The patient is not nervous/anxious.     Objective:  BP 122/80 mmHg  Pulse 56  Temp(Src) 98 F (36.7 C) (Oral)  Ht 5\' 7"  (1.702 m)  Wt 226 lb 12 oz (102.853 kg)  BMI 35.51 kg/m2  SpO2 97%  Physical Exam  Constitutional: She is oriented to person, place, and time. She appears well-developed and well-nourished. No distress.  HENT:  Head: Normocephalic and atraumatic.  Right Ear: External ear normal.  Left Ear: External ear normal.  Eyes: Right eye exhibits no discharge. Left eye exhibits no discharge. No scleral icterus.  Cardiovascular: Normal rate, regular rhythm and normal heart sounds.  Exam reveals no gallop and no friction rub.   No murmur heard. Pulmonary/Chest: Effort normal and breath sounds normal. No respiratory distress. She has  no wheezes. She has no rales. She exhibits no tenderness.  Abdominal: Soft. Bowel sounds are normal. She exhibits no distension and no mass. There is no tenderness. There is no rebound and no guarding.  Obese abdomen  Musculoskeletal: Normal range of motion. She exhibits no edema or tenderness.  No edema noticed in bilateral LE   Neurological: She is alert and oriented to person, place, and time. No cranial nerve deficit. She exhibits normal muscle tone. Coordination  normal.  Skin: Skin is warm and dry. No rash noted. She is not diaphoretic.  Psychiatric: Her behavior is normal. Judgment and thought content normal.  Flat affect    Assessment & Plan:   Kaytelyn was seen today for acute visit.  Diagnoses and all orders for this visit:  IBS (irritable bowel syndrome) -     Amb ref to Medical Nutrition Therapy-MNT -     Comprehensive metabolic panel -     Hemoglobin A1c -     CBC w/Diff -     Vitamin D (25 hydroxy) -     Lipid panel  Essential hypertension -     Comprehensive metabolic panel -     Hemoglobin A1c -     CBC w/Diff -     Vitamin D (25 hydroxy) -     Lipid panel  Leg swelling -     Comprehensive metabolic panel -     Hemoglobin A1c -     CBC w/Diff -     Vitamin D (25 hydroxy) -     Lipid panel  Other orders -     LDL cholesterol, direct   I have discontinued Ms. Narvaez's furosemide. I am also having her maintain her sertraline, clonazePAM, Lurasidone HCl, and hydrochlorothiazide.  No orders of the defined types were placed in this encounter.     Follow-up: Return in about 3 months (around 07/27/2015) for Follow up .

## 2015-04-27 LAB — VITAMIN D 25 HYDROXY (VIT D DEFICIENCY, FRACTURES): VITD: 18.98 ng/mL — ABNORMAL LOW (ref 30.00–100.00)

## 2015-05-01 DIAGNOSIS — K589 Irritable bowel syndrome without diarrhea: Secondary | ICD-10-CM | POA: Insufficient documentation

## 2015-05-01 NOTE — Assessment & Plan Note (Addendum)
Checking labs today New problem to me No edema at this time Advised increase in water intake

## 2015-05-01 NOTE — Assessment & Plan Note (Addendum)
Gave FODMAPS diet handout  New problem to me Obtain labs Advised to try Zantac and Imodium OTC Referral to nutrition therapy

## 2015-05-01 NOTE — Assessment & Plan Note (Signed)
BP Readings from Last 3 Encounters:  04/26/15 122/80  09/30/14 122/80  09/02/14 118/88   Stable. Continue current regimen- buy a cuff

## 2015-06-05 ENCOUNTER — Ambulatory Visit: Payer: BLUE CROSS/BLUE SHIELD | Admitting: Dietician

## 2015-07-31 ENCOUNTER — Ambulatory Visit: Payer: BLUE CROSS/BLUE SHIELD | Admitting: Nurse Practitioner

## 2015-08-18 ENCOUNTER — Encounter: Payer: Self-pay | Admitting: Family Medicine

## 2015-08-18 ENCOUNTER — Ambulatory Visit (INDEPENDENT_AMBULATORY_CARE_PROVIDER_SITE_OTHER): Payer: BLUE CROSS/BLUE SHIELD | Admitting: Family Medicine

## 2015-08-18 VITALS — BP 112/84 | HR 71 | Temp 97.9°F | Ht 67.0 in | Wt 223.8 lb

## 2015-08-18 DIAGNOSIS — F419 Anxiety disorder, unspecified: Secondary | ICD-10-CM

## 2015-08-18 DIAGNOSIS — I10 Essential (primary) hypertension: Secondary | ICD-10-CM

## 2015-08-18 DIAGNOSIS — E559 Vitamin D deficiency, unspecified: Secondary | ICD-10-CM | POA: Diagnosis not present

## 2015-08-18 DIAGNOSIS — E785 Hyperlipidemia, unspecified: Secondary | ICD-10-CM

## 2015-08-18 DIAGNOSIS — R079 Chest pain, unspecified: Secondary | ICD-10-CM | POA: Diagnosis not present

## 2015-08-18 DIAGNOSIS — E782 Mixed hyperlipidemia: Secondary | ICD-10-CM | POA: Insufficient documentation

## 2015-08-18 LAB — COMPREHENSIVE METABOLIC PANEL
ALT: 25 U/L (ref 0–35)
AST: 19 U/L (ref 0–37)
Albumin: 4.4 g/dL (ref 3.5–5.2)
Alkaline Phosphatase: 45 U/L (ref 39–117)
BUN: 10 mg/dL (ref 6–23)
CHLORIDE: 103 meq/L (ref 96–112)
CO2: 30 mEq/L (ref 19–32)
Calcium: 9.6 mg/dL (ref 8.4–10.5)
Creatinine, Ser: 0.7 mg/dL (ref 0.40–1.20)
GFR: 96.59 mL/min (ref >60.00)
GLUCOSE: 93 mg/dL (ref 70–99)
POTASSIUM: 3.5 meq/L (ref 3.5–5.1)
Sodium: 140 mEq/L (ref 135–145)
Total Bilirubin: 0.4 mg/dL (ref 0.2–1.2)
Total Protein: 6.7 g/dL (ref 6.0–8.3)

## 2015-08-18 NOTE — Assessment & Plan Note (Signed)
Recheck vitamin D level 

## 2015-08-18 NOTE — Progress Notes (Signed)
Patient ID: Melissa Shepherd, female   DOB: [birth year], 45 y.o.   MRN: [id number]  [name], MD Phone: [phone]  Melissa Shepherd is a 45 y.o. female who presents today for follow-up.  HYPERTENSION Disease Monitoring Home BP Monitoring checking at home, over the last week reports range 146-158/102-114 though at the end of the visit she does report oftentimes in the 140s over 90s at home over the last week. Chest pain- yes, see below    Dyspnea- yes, see below Medications Compliance-  taking HCTZ.  Edema- see below  Patient reports for a long time she has felt as though she needs take a deep breath and as though her breath is going to go away though this occurs infrequently currently. Lasts for about 3 seconds and goes away on its own. She has other unrelated episodes of chest discomfort that feels like somebody is sitting on her chest. Just hurts. No associated diaphoresis or shortness of breath or exertional component. She had a stress test July of last year that was negative. She underwent significant evaluation at [facility] that was unremarkable as well for cardiac cause. Further review of the cardiology noted was felt to be likely related to anxiety. Patient is followed by psychiatrist who did write a note wondering if the patient would benefit from a beta blocker given her anxiety.  Hyperlipidemia: High last time it was checked. She's currently on latuda. Not currently on cholesterol medication. Not in a risk-benefit category per ASCVD guidelines.  Has been taking vitamin D supplements thousand units for the last 3 months.  PMH: Smoker   ROS see history of present illness  Objective  Physical Exam Filed Vitals:   08/18/15 1328  BP: 112/84  Pulse: 71  Temp: 97.9 F (36.6 C)    BP Readings from Last 3 Encounters:  08/18/15 112/84  04/26/15 122/80  09/30/14 122/80   Wt Readings from Last 3 Encounters:  08/18/15 223 lb 12.8 oz (101.515 kg)  04/26/15 226 lb 12 oz (102.853  kg)  09/30/14 213 lb 3.2 oz (96.707 kg)    Physical Exam  Constitutional: She is well-developed, well-nourished, and in no distress.  HENT:  Head: Normocephalic and atraumatic.  Right Ear: External ear normal.  Left Ear: External ear normal.  Eyes: Conjunctivae are normal. Pupils are equal, round, and reactive to light.  Cardiovascular: Normal rate, regular rhythm and normal heart sounds.   Pulmonary/Chest: Effort normal and breath sounds normal.  Neurological: She is alert. Gait normal.  Skin: Skin is warm and dry. She is not diaphoretic.   EKG: Sinus bradycardia, rate 59, T-wave inversion in lead 3, no other ST or T-wave changes  Assessment/Plan: Please see individual problem list.  Essential hypertension At goal in the office. Reported blood pressures at home are out of goal range. Blood pressures have been in normal range in the office when checked recently. Quite discordant blood pressures. Discussed continuing hydrochlorothiazide. Discussed monitoring blood pressures over the weekend at home. If greater than 180/110 she'll seek medical attention. She will follow-up on Monday for blood pressure check with nursing and bring her blood pressure cuff to compare. She was advised of symptoms to seek medical attention for. We'll check a CMP today.  Intermittent chest pain Patient with persistent intermittent chest pain throughout the years. Has had significant workup to cardiology at 2 facilities. Recent stress test unremarkable. EKG today reassuring. Suspect likely related to anxiety and panic attacks for which she is seeing psychiatry. She will continue to  monitor. She's given return precautions.  Anxiety Suspect patient's chest pain symptoms and breathing symptoms are likely related to anxiety. Has had negative cardiac workup in the last year. Patient's psychiatrist wanted to know if beta blocker would be an option to help with blood pressure and treat panic attacks. Given heart rate on  EKG I do not think this is a good option at this time. She will continue to follow with a psychiatrist. She is given return precautions.  Hyperlipidemia Cholesterol mildly elevated. Could be related to her Latuda. Not elevated enough to require medication. She will work on diet.  Vitamin D deficiency Recheck vitamin D level.    Orders Placed This Encounter  Procedures  . Vitamin D (25 hydroxy)  . Comp Met (CMET)  . EKG 12-Lead    [name], MD [place]

## 2015-08-18 NOTE — Assessment & Plan Note (Addendum)
At goal in the office. Reported blood pressures at home are out of goal range. Blood pressures have been in normal range in the office when checked recently. Quite discordant blood pressures. Discussed continuing hydrochlorothiazide. Discussed monitoring blood pressures over the weekend at home. If greater than 180/110 she'll seek medical attention. She will follow-up on Monday for blood pressure check with nursing and bring her blood pressure cuff to compare. She was advised of symptoms to seek medical attention for. We'll check a CMP today.

## 2015-08-18 NOTE — Progress Notes (Signed)
Pre visit review using our clinic review tool, if applicable. No additional management support is needed unless otherwise documented below in the visit note. 

## 2015-08-18 NOTE — Assessment & Plan Note (Signed)
Cholesterol mildly elevated. Could be related to her Latuda. Not elevated enough to require medication. She will work on diet.

## 2015-08-18 NOTE — Patient Instructions (Signed)
Nice to meet you. Please continue to monitor your blood pressure over the weekend. If it is greater than 180/110 please get evaluated. We will have you come back on Monday for a nurse visit to have blood pressure check please bring your blood pressure monitor to this visit. We will check some lab work today. If you develop chest pain, shortness of breath, swelling in her ankles, blurry vision, numbness, weakness, or any new or changing symptoms please seek medical attention.

## 2015-08-18 NOTE — Assessment & Plan Note (Signed)
Patient with persistent intermittent chest pain throughout the years. Has had significant workup to cardiology at 2 facilities. Recent stress test unremarkable. EKG today reassuring. Suspect likely related to anxiety and panic attacks for which she is seeing psychiatry. She will continue to monitor. She's given return precautions.

## 2015-08-18 NOTE — Assessment & Plan Note (Signed)
Suspect patient's chest pain symptoms and breathing symptoms are likely related to anxiety. Has had negative cardiac workup in the last year. Patient's psychiatrist wanted to know if beta blocker would be an option to help with blood pressure and treat panic attacks. Given heart rate on EKG I do not think this is a good option at this time. She will continue to follow with a psychiatrist. She is given return precautions.

## 2015-08-21 LAB — VITAMIN D 25 HYDROXY (VIT D DEFICIENCY, FRACTURES): VITD: 31.96 ng/mL (ref 30.00–100.00)

## 2015-08-22 ENCOUNTER — Encounter: Payer: Self-pay | Admitting: Family Medicine

## 2015-08-22 ENCOUNTER — Ambulatory Visit (INDEPENDENT_AMBULATORY_CARE_PROVIDER_SITE_OTHER): Payer: BLUE CROSS/BLUE SHIELD

## 2015-08-22 VITALS — BP 116/80 | HR 70 | Resp 18

## 2015-08-22 DIAGNOSIS — I10 Essential (primary) hypertension: Secondary | ICD-10-CM | POA: Diagnosis not present

## 2015-08-22 NOTE — Progress Notes (Addendum)
Patient came in for BP check.  See vitals for details.  One week check.    Please advise in Dr. Ellen Henri absence. Thanks  Patient dropped off BP reading, put on her desk for receive. thanks

## 2015-08-22 NOTE — Progress Notes (Signed)
BP well controlled. Management per primary.

## 2015-08-25 ENCOUNTER — Telehealth: Payer: Self-pay | Admitting: Family Medicine

## 2015-08-25 ENCOUNTER — Other Ambulatory Visit: Payer: Self-pay | Admitting: Nurse Practitioner

## 2015-08-25 NOTE — Progress Notes (Signed)
Returned patient call.  No answer.  Left message to call the office back.

## 2015-08-25 NOTE — Telephone Encounter (Signed)
Error

## 2015-08-25 NOTE — Progress Notes (Signed)
Patient called needing a reply regarding her BP readings and BP check on 6.27.17. [phone]

## 2015-08-30 ENCOUNTER — Encounter: Payer: Self-pay | Admitting: Family Medicine

## 2015-08-30 ENCOUNTER — Ambulatory Visit (INDEPENDENT_AMBULATORY_CARE_PROVIDER_SITE_OTHER): Payer: BLUE CROSS/BLUE SHIELD | Admitting: Family Medicine

## 2015-08-30 VITALS — BP 118/72 | HR 89 | Temp 98.0°F | Ht 67.0 in | Wt 219.6 lb

## 2015-08-30 DIAGNOSIS — F32A Depression, unspecified: Secondary | ICD-10-CM

## 2015-08-30 DIAGNOSIS — F329 Major depressive disorder, single episode, unspecified: Secondary | ICD-10-CM

## 2015-08-30 DIAGNOSIS — F419 Anxiety disorder, unspecified: Secondary | ICD-10-CM

## 2015-08-30 DIAGNOSIS — I10 Essential (primary) hypertension: Secondary | ICD-10-CM

## 2015-08-30 NOTE — Assessment & Plan Note (Signed)
Some recent depression. No SI. Following with psychiatry.

## 2015-08-30 NOTE — Assessment & Plan Note (Signed)
Stable. Followed by psychiatry. She'll continue her current medications and follow-up with them. Doubt medication changes recently have contributed to her blood pressure issues.

## 2015-08-30 NOTE — Patient Instructions (Signed)
Nice to see you. Your blood pressure is normal today. I am going to work on trying to get you to see somebody that can do 24-hour blood pressure monitoring to evaluate your blood pressures. You should continue to see her psychiatrist and continue your current medications. If you do not hear something from [place] in the next week regarding blood pressure please let us know. If you develop chest pain, shortness of breath, worsening anxiety, worsening depression, or thoughts of hurting herself please seek medical attention.

## 2015-08-30 NOTE — Progress Notes (Signed)
Pre visit review using our clinic review tool, if applicable. No additional management support is needed unless otherwise documented below in the visit note. 

## 2015-08-30 NOTE — Progress Notes (Signed)
Patient ID: Melissa Shepherd, female   DOB: October 18, 1970, 45 y.o.   MRN: [id number]  [name], MD Phone: [phone]  Melissa Shepherd is a 45 y.o. female who presents today for follow-up.  Hypertension: Patient notes she's been checking at home and blood pressure has been widely variable over the last week. Anywhere from 126/83-161/114. No symptoms when her blood pressure is elevated. No chest pain or shortness of breath. On check today it is in the normal range with my check in the CMA check and then with the patient's blood pressure cuff it is quite different immediately after my check. It is 133/95 with her blood pressure machine and 108/78 with my check.  She does have anxiety and is followed by psychiatrist. Take Xanax now. Was on Klonopin. Wonders if her blood blood pressure is related to this change. Also on latuda and Zoloft. Some depression. No SI.  PMH: Smoker   ROS see history of present illness  Objective  Physical Exam Filed Vitals:   08/30/15 1450  BP: 118/72  Pulse: 89  Temp: 98 F (36.7 C)    BP Readings from Last 3 Encounters:  08/30/15 118/72  08/22/15 116/80  08/18/15 112/84   Wt Readings from Last 3 Encounters:  08/30/15 219 lb 9.6 oz (99.61 kg)  08/18/15 223 lb 12.8 oz (101.515 kg)  04/26/15 226 lb 12 oz (102.853 kg)    Physical Exam  Constitutional: She is well-developed, well-nourished, and in no distress.  HENT:  Head: Normocephalic and atraumatic.  Right Ear: External ear normal.  Left Ear: External ear normal.  Cardiovascular: Normal rate, regular rhythm and normal heart sounds.   Pulmonary/Chest: Effort normal and breath sounds normal.  Musculoskeletal: She exhibits no edema.  Neurological: She is alert. Gait normal.  Skin: Skin is warm and dry. She is not diaphoretic.  Psychiatric:  Mood anxious, affect anxious     Assessment/Plan: Please see individual problem list.  Anxiety Stable. Followed by psychiatry. She'll continue her current  medications and follow-up with them. Doubt medication changes recently have contributed to her blood pressure issues.  Depression Some recent depression. No SI. Following with psychiatry.  Essential hypertension Blood pressure remains at goal today in office on our checks. Pressures checked manually by myself and CMA and noted to be in the normal range. Checked immediately after this with patient's blood pressure monitor from home and quite a difference was noted. Suspect blood pressure monitor at home is not accurate. Advised to look at the instructions to try to calibrate or reset this. We'll also attempt to get patient in for 24-hour blood pressure monitoring if possible to relieve some of her anxiety regarding this. She is advised that if in the she does not hear anything regarding this next week she is to call the office. She is given return precautions.     [name], MD [place]

## 2015-08-30 NOTE — Assessment & Plan Note (Signed)
Blood pressure remains at goal today in office on our checks. Pressures checked manually by myself and CMA and noted to be in the normal range. Checked immediately after this with patient's blood pressure monitor from home and quite a difference was noted. Suspect blood pressure monitor at home is not accurate. Advised to look at the instructions to try to calibrate or reset this. We'll also attempt to get patient in for 24-hour blood pressure monitoring if possible to relieve some of her anxiety regarding this. She is advised that if in the she does not hear anything regarding this next week she is to call the office. She is given return precautions.

## 2015-11-03 ENCOUNTER — Telehealth: Payer: Self-pay | Admitting: Family Medicine

## 2015-11-03 NOTE — Telephone Encounter (Signed)
Patient Name: JAIYA ACEVES  DOB: [birth year]    Initial Comment Caller states been having dizzy spells, vision got blurry   Nurse Assessment  Nurse: Mallie Mussel, RN, Alveta Heimlich Date/Time (Eastern Time): 11/03/2015 2:25:44 PM  Confirm and document reason for call. If symptomatic, describe symptoms. You must click the next button to save text entered. ---Caller states that she has dizzy spells for the past 3 days. They only last a few seconds. She sometimes she leans to the left. She states that when she has that, the room is spinning. Her vision got blurry last night while she was driving and she was dizzy at the time. She denies head injury in the last 3 days. She denies headache.  Has the patient traveled out of the country within the last 30 days? ---No  Does the patient have any new or worsening symptoms? ---Yes  Will a triage be completed? ---Yes  Related visit to physician within the last 2 weeks? ---No  Does the PT have any chronic conditions? (i.e. diabetes, asthma, etc.) ---Yes  List chronic conditions. ---HTN  Is the patient pregnant or possibly pregnant? (Ask all females between the ages of 54-55) ---No  Is this a behavioral health or substance abuse call? ---No     Guidelines    Guideline Title Affirmed Question Affirmed Notes  Dizziness - Lightheadedness [1] MODERATE dizziness (e.g., interferes with normal activities) AND [2] has NOT been evaluated by physician for this (Exception: dizziness caused by heat exposure, sudden standing, or poor fluid intake)    Final Disposition User   See Physician within 24 Hours Mallie Mussel, RN, Alveta Heimlich    Comments  No appointments available at [facility]. I offered to check at one of the [facility], but she declined. She will go to an UC to be seen. Does not know the name of it.   Referrals  REFERRED TO PCP OFFICE   Disagree/Comply: Comply

## 2015-11-06 NOTE — Telephone Encounter (Signed)
FYI

## 2015-12-04 ENCOUNTER — Ambulatory Visit: Payer: BLUE CROSS/BLUE SHIELD | Admitting: Family Medicine

## 2016-03-15 ENCOUNTER — Other Ambulatory Visit: Payer: Self-pay | Admitting: Family Medicine

## 2016-04-03 ENCOUNTER — Telehealth: Payer: Self-pay | Admitting: Family Medicine

## 2016-04-03 ENCOUNTER — Encounter: Payer: Self-pay | Admitting: Emergency Medicine

## 2016-04-03 ENCOUNTER — Emergency Department
Admission: EM | Admit: 2016-04-03 | Discharge: 2016-04-04 | Disposition: A | Discharge status: Home / Self Care | Payer: BLUE CROSS/BLUE SHIELD | Attending: Emergency Medicine | Admitting: Emergency Medicine

## 2016-04-03 ENCOUNTER — Emergency Department: Payer: BLUE CROSS/BLUE SHIELD

## 2016-04-03 DIAGNOSIS — I10 Essential (primary) hypertension: Secondary | ICD-10-CM | POA: Insufficient documentation

## 2016-04-03 DIAGNOSIS — Z79899 Other long term (current) drug therapy: Secondary | ICD-10-CM | POA: Diagnosis not present

## 2016-04-03 DIAGNOSIS — F17210 Nicotine dependence, cigarettes, uncomplicated: Secondary | ICD-10-CM | POA: Insufficient documentation

## 2016-04-03 DIAGNOSIS — M7989 Other specified soft tissue disorders: Secondary | ICD-10-CM | POA: Diagnosis present

## 2016-04-03 DIAGNOSIS — R0789 Other chest pain: Secondary | ICD-10-CM | POA: Diagnosis not present

## 2016-04-03 DIAGNOSIS — R609 Edema, unspecified: Secondary | ICD-10-CM | POA: Diagnosis not present

## 2016-04-03 DIAGNOSIS — R0602 Shortness of breath: Secondary | ICD-10-CM | POA: Insufficient documentation

## 2016-04-03 DIAGNOSIS — R079 Chest pain, unspecified: Secondary | ICD-10-CM

## 2016-04-03 LAB — BASIC METABOLIC PANEL
ANION GAP: 6 (ref 5–15)
BUN: 8 mg/dL (ref 6–20)
CALCIUM: 9.2 mg/dL (ref 8.9–10.3)
CO2: 28 mmol/L (ref 22–32)
CREATININE: 0.8 mg/dL (ref 0.44–1.00)
Chloride: 103 mmol/L (ref 101–111)
GFR calc Af Amer: >60 mL/min (ref >60)
GFR calc non Af Amer: >60 mL/min (ref >60)
GLUCOSE: 93 mg/dL (ref 65–99)
Potassium: 3.2 mmol/L — ABNORMAL LOW (ref 3.5–5.1)
Sodium: 137 mmol/L (ref 135–145)

## 2016-04-03 LAB — CBC
HCT: 36.8 % (ref 35.0–47.0)
HEMOGLOBIN: 12.8 g/dL (ref 12.0–16.0)
MCH: 30.6 pg (ref 26.0–34.0)
MCHC: 34.8 g/dL (ref 32.0–36.0)
MCV: 88.1 fL (ref 80.0–100.0)
Platelets: 249 10*3/uL (ref 150–440)
RBC: 4.17 MIL/uL (ref 3.80–5.20)
RDW: 14.2 % (ref 11.5–14.5)
WBC: 7.4 10*3/uL (ref 3.6–11.0)

## 2016-04-03 LAB — BRAIN NATRIURETIC PEPTIDE: B Natriuretic Peptide: 57 pg/mL (ref 0.0–100.0)

## 2016-04-03 LAB — TROPONIN I

## 2016-04-03 NOTE — Telephone Encounter (Signed)
fyi

## 2016-04-03 NOTE — Telephone Encounter (Signed)
Please contact patient and see if she is planning on being evaluated today. I would suggest urgent care or walking clinic. Thanks.

## 2016-04-03 NOTE — ED Triage Notes (Signed)
Pt with bilateral lower leg swelling with pitting edema. Pt taking lasix at this time but has never been dx with CHF.

## 2016-04-03 NOTE — Discharge Instructions (Signed)
You have been seen in the emergency department today for chest pain. Your workup has shown normal results. As we discussed please follow-up with your primary care physician in the next 1-2 days for recheck. Return to the emergency department for any further chest pain, trouble breathing, or any other symptom personally concerning to yourself.  Please call the number provided for cardiology tomorrow morning to arrange a follow-up appointment as soon as possible.

## 2016-04-03 NOTE — Telephone Encounter (Signed)
[facility]  Patient Name: Melissa Shepherd  DOB: [birth year]    Initial Comment Caller states her legs are really swollen.    Nurse Assessment  Nurse: Harlow Mares, RN, Suanne Marker Date/Time (Eastern Time): 04/03/2016 3:47:23 PM  Confirm and document reason for call. If symptomatic, describe symptoms. ---Caller states her legs are really swollen. Both legs, have been swollen for a couple of weeks. Stomach is bloated. Had some problems breathing. Had to close work and take a break due to chest pain. No chest pain or SOB today.  Does the patient have any new or worsening symptoms? ---Yes  Will a triage be completed? ---Yes  Related visit to physician within the last 2 weeks? ---No  Does the PT have any chronic conditions? (i.e. diabetes, asthma, etc.) ---Yes  List chronic conditions. ---anxiety; HTN; prn Lasix;  Is the patient pregnant or possibly pregnant? (Ask all females between the ages of 29-55) ---No  Is this a behavioral health or substance abuse call? ---No     Guidelines    Guideline Title Affirmed Question Affirmed Notes  Leg Swelling and Edema SEVERE leg swelling (e.g., swelling extends above knee, entire leg is swollen, weeping fluid)    Final Disposition User   See Physician within 4 Hours (or PCP triage) Harlow Mares, RN, Rhonda    Referrals  GO TO [place] UNDECIDED   Disagree/Comply: Comply

## 2016-04-03 NOTE — Telephone Encounter (Signed)
Patient states she is going to the ER

## 2016-04-03 NOTE — Telephone Encounter (Signed)
Pt called about having pressure in her chest yesterday. And both legs are swollen down to feet and stomach swollen. No nausea, Fatigue. Pt is going to be transferred to [facility]. Please advise?  Call pt @ [phone]. Thank you!

## 2016-04-03 NOTE — Telephone Encounter (Signed)
See [facility] triage note. Will follow.

## 2016-04-03 NOTE — ED Notes (Signed)
Pt reports swelling of lower legs and feet for several weeks.  Pt taking hctz and lasix prn.   Pt had episode of chest pain at work yesterday, no pain now   No sob. cig smoker.  No cough   No fever .  Pt alert speech clear   Family with pt.   Skin warm anddry   nsr on monitor.

## 2016-04-03 NOTE — ED Notes (Signed)
Report off to iris rn  

## 2016-04-03 NOTE — ED Provider Notes (Signed)
[facility] Emergency Department Provider Note  Time seen: 11:06 PM  I have reviewed the triage vital signs and the nursing notes.   HISTORY  Chief Complaint Leg Swelling    HPI Melissa Shepherd is a 46 y.o. female with a past medical history of peripheral edema, hypertension, presents the emergency department with chest pain and leg swelling. According to the patient for the past several years she has had mild lower extremity swelling for which she intermittently takes Lasix. She states over the past one year she has now had 3 episodes of chest pain which she describes as sudden sharp pain in the center of her chest. The last episode occurred yesterday lasted for about 30-45 minutes before resolving very quickly. Denies nausea or diaphoresis does state some shortness of breath. She also states during the episode she was trying to make herself belched to feel better she had to have an episode of diarrhea and then felt better shortly afterwards. Patient states he feels like her legs are more swollen over the past one month. She states she has had chest pain before and she has had a stress test approximately one year ago. She also states she has had a CT of her coronaries performed in the past and was told that her heart looked very healthy. Currently the patient denies any symptoms at this time. States she told a coworker about her episode yesterday which is what caused her to come to the ER today.  Past Medical History:  Diagnosis Date  . Chicken pox   . Depression   . Frequent headaches   . Hypertension   . Migraine     Patient Active Problem List   Diagnosis Date Noted  . Anxiety 08/18/2015  . Hyperlipidemia 08/18/2015  . Vitamin D deficiency 08/18/2015  . IBS (irritable bowel syndrome) 05/01/2015  . Lumbago 10/06/2014  . Bilateral thoracic back pain 10/06/2014  . Intermittent chest pain 07/09/2014  . Leg swelling 07/09/2014  . Essential hypertension 06/11/2014   . Depression 06/11/2014  . Tobacco use disorder 06/11/2014    Past Surgical History:  Procedure Laterality Date  . [place]. 2003.   2    Prior to Admission medications   Medication Sig Start Date End Date Taking? Authorizing Provider  ALPRAZolam Duanne Moron) 1 MG tablet  08/14/15   Historical Provider, MD  clonazePAM (KLONOPIN) 0.5 MG tablet Take 0.5 mg by mouth. Reported on 08/18/2015 05/18/14   Historical Provider, MD  hydrochlorothiazide (HYDRODIURIL) 12.5 MG tablet TAKE 1 TABLET (12.5 MG TOTAL) BY MOUTH DAILY. 03/15/16   Leone Haven, MD  Lurasidone HCl (LATUDA) 60 MG TABS Take 20 mg by mouth daily.     Historical Provider, MD  sertraline (ZOLOFT) 50 MG tablet Take 100 mg by mouth daily.     Historical Provider, MD    No Known Allergies  Family History  Problem Relation Age of Onset  . Hypertension Mother   . Diabetes Son   . Cancer Maternal Grandmother     Breast  . Mental retardation Maternal Grandmother     Social History Social History  Substance Use Topics  . Smoking status: Current Every Day Smoker    Packs/day: 1.00    Types: Cigarettes  . Smokeless tobacco: Never Used  . Alcohol use 0.0 oz/week    Review of Systems Constitutional: Negative for fever. Cardiovascular: Intermittent chest pain over the past 1 year, 3 episodes. Respiratory: Mild shortness breath with the chest pain, gone.  Gastrointestinal: Negative for abdominal pain. One episode of diarrhea yesterday. Neurological: Negative for headache 10-point ROS otherwise negative.  ____________________________________________   PHYSICAL EXAM:  VITAL SIGNS: ED Triage Vitals  Enc Vitals Group     BP 04/03/16 1738 (!) 146/94     Pulse Rate 04/03/16 1738 93     Resp 04/03/16 1738 18     Temp 04/03/16 1738 98.3 F (36.8 C)     Temp Source 04/03/16 1738 Oral     SpO2 04/03/16 1738 98 %     Weight 04/03/16 1741 230 lb (104.3 kg)     Height --      Head Circumference --      Peak  Flow --      Pain Score --      Pain Loc --      Pain Edu? --      Excl. in [place]? --     Constitutional: Alert and oriented. Well appearing and in no distress. Eyes: Normal exam ENT   Head: Normocephalic and atraumatic.   Mouth/Throat: Mucous membranes are moist. Cardiovascular: Normal rate, regular rhythm. No murmur Respiratory: Normal respiratory effort without tachypnea nor retractions. Breath sounds are clear  Gastrointestinal: Soft and nontender. No distention.   Musculoskeletal: 1+ lower extremity edema, equal bilaterally. 2+ DP pulses, neurovascular intact. Neurologic:  Normal speech and language. No gross focal neurologic deficits Skin:  Skin is warm, dry and intact.  Psychiatric: Mood and affect are normal.  ____________________________________________    EKG  EKG reviewed and interpreted by my social is normal sinus rhythm at 85 bpm, narrow QRS, normal axis, normal intervals, no concerning ST changes. Normal EKG.  ____________________________________________    RADIOLOGY  Chest x-ray shows a mild amount of vascular congestion.  ____________________________________________   INITIAL IMPRESSION / ASSESSMENT AND PLAN / ED COURSE  Pertinent labs & imaging results that were available during my care of the patient were reviewed by me and considered in my medical decision making (see chart for details).  Patient presents the emergency department with intermittent chest discomfort over the past 1 year, 3 episodes in total nail. Patient also states mild shortness breath with the event. She states fairly chronic peripheral edema but worse over the past one month. Patient takes Lasix at home intermittently. I discussed with the patient the need to follow-up with a cardiologist for an echocardiogram. Patient states she had a stress test last year as well as a recent CT scan of her coronaries. She states she does not have a regular cardiologist that she sees. Patient is  somewhat hypertensive in the emergency department 173/108. Patient is taking her medications, this time I do not believe we should change the patient's medications until she can be evaluated by cardiology. Patient's workup in the emergency department is largely negative besides mild vascular congestion on the chest x-ray. Patient's BNP is normal, troponin is negative, labs are normal, EKG is normal. Patient is symptom-free.  ____________________________________________   FINAL CLINICAL IMPRESSION(S) / ED DIAGNOSES  Peripheral edema Chest pain    Harvest Dark, MD 04/03/16 2312

## 2016-04-04 NOTE — ED Notes (Signed)
Pt discharged to home.  Family member driving.  Discharge instructions reviewed.  Verbalized understanding.  No questions or concerns at this time.  Teach back verified.  Pt in NAD.  No items left in ED.   

## 2016-04-05 ENCOUNTER — Telehealth: Payer: Self-pay | Admitting: Family Medicine

## 2016-04-05 NOTE — Telephone Encounter (Signed)
Please find out how often she has been taking Lasix as it does not appear is on her medication list anymore. Thanks.

## 2016-04-05 NOTE — Telephone Encounter (Signed)
Left message to notify

## 2016-04-05 NOTE — Telephone Encounter (Signed)
She can continue taking this on an as-needed basis for lower external swelling though we will need to discuss this at her visit next week.

## 2016-04-05 NOTE — Telephone Encounter (Signed)
Please advise 

## 2016-04-05 NOTE — Telephone Encounter (Signed)
Pt wanted to know if she should take lasix 20mg  more or everyday? Please advise? Thank you!  Call pt @ [phone].

## 2016-04-05 NOTE — Telephone Encounter (Signed)
Patient states she only takes it once a month because she takes hydrochlorothiazide as well. She took one tab tue and one today.

## 2016-04-08 ENCOUNTER — Ambulatory Visit (INDEPENDENT_AMBULATORY_CARE_PROVIDER_SITE_OTHER): Payer: BLUE CROSS/BLUE SHIELD | Admitting: Family Medicine

## 2016-04-08 ENCOUNTER — Ambulatory Visit: Payer: BLUE CROSS/BLUE SHIELD | Admitting: Family Medicine

## 2016-04-08 ENCOUNTER — Encounter: Payer: Self-pay | Admitting: Family Medicine

## 2016-04-08 VITALS — BP 132/74 | HR 98 | Temp 97.6°F | Wt 242.2 lb

## 2016-04-08 DIAGNOSIS — R079 Chest pain, unspecified: Secondary | ICD-10-CM

## 2016-04-08 DIAGNOSIS — I10 Essential (primary) hypertension: Secondary | ICD-10-CM

## 2016-04-08 MED ORDER — METOPROLOL SUCCINATE ER 25 MG PO TB24: 25.0000 mg | ORAL_TABLET | Freq: Every day | ORAL | 3 refills | Status: DC

## 2016-04-08 MED ORDER — FUROSEMIDE 20 MG PO TABS: 20.0000 mg | ORAL_TABLET | Freq: Every day | ORAL | 3 refills | Status: DC | PRN

## 2016-04-08 NOTE — Progress Notes (Signed)
Pre visit review using our clinic review tool, if applicable. No additional management support is needed unless otherwise documented below in the visit note. 

## 2016-04-08 NOTE — Progress Notes (Signed)
[name], MD Phone: [phone]  Melissa Shepherd is a 46 y.o. female who presents today for emergency room follow-up.  Patient notes she went to the emergency room for chest pain. The day prior to going to the emergency room she had a 45 minute episode where it felt like something was sitting on her chest. She was short of breath with it. It occurred when she was cutting hair. She notes it went away on its own with no alleviating factors. She was not diaphoretic with it. She was not anxious. She notes no reflux symptoms. She does note edema in her bilateral ankles to her knees. No orthopnea or PND. Notes her blood pressure has been running high at home over the last several months though she has only checked it 3 times. It has been 150-162/92-94. Notes prior evaluation with stress test that was normal and coronary CT scan that was normal. These have both been greater than a year ago. Notes a maternal grandfather with an MI at 22 and a maternal with a MI at 29. Workup in the ED was unremarkable for ischemia. EKG was normal. BNP in the normal range as well. Troponin negative. Chest x-ray with possible vascular congestion versus bronchitis  PMH: Smoker   ROS see history of present illness  Objective  Physical Exam Vitals:   04/08/16 1138  BP: 132/74  Pulse: 98  Temp: 97.6 F (36.4 C)    BP Readings from Last 3 Encounters:  04/08/16 132/74  04/03/16 (!) 150/93  08/30/15 118/72   Wt Readings from Last 3 Encounters:  04/08/16 242 lb 3.2 oz (109.9 kg)  04/03/16 230 lb (104.3 kg)  08/30/15 219 lb 9.6 oz (99.6 kg)    Physical Exam  Constitutional: No distress.  Cardiovascular: Normal rate, regular rhythm and normal heart sounds.   Pulmonary/Chest: Effort normal and breath sounds normal.  Musculoskeletal: She exhibits edema (trace lower extremity edema to mid shin bilaterally).  Neurological: She is alert. Gait normal.  Skin: Skin is warm and dry. She is not diaphoretic.      Assessment/Plan: Please see individual problem list.  Essential hypertension BP at goal here though not at goal at home. Discussed increasing hydrochlorothiazide versus adding an additional agent. Patient opted for an additional agent and we will add metoprolol. She'll monitor her blood pressure and let us know if it is not improving.  Chest pain Somewhat typical and atypical features. Workup in the ED was unremarkable for cardiac cause. Given swelling this could be related to volume overload particularly given edema in her legs and weight gain over the last several months. Could also be bronchitis related given there were possible bronchitic changes on chest x-ray. Potentially could be anxiety related as well. We will refer to cardiology for further evaluation. We'll try Lasix when necessary to see if this will help with swelling. If she takes this frequently we will recheck lab work in a week. We will start her on metoprolol for her blood pressure as this would additionally benefit chest pain and potential heart failure as she is not in acute heart failure. She will continue to monitor. She's given return precautions.   Orders Placed This Encounter  Procedures  . Ambulatory referral to Cardiology    Referral Priority:   Routine    Referral Type:   Consultation    Referral Reason:   Specialty Services Required    Requested Specialty:   Cardiology    Number of Visits Requested:   1  [name], MD [place]

## 2016-04-08 NOTE — Patient Instructions (Signed)
Nice to see you. We will start you on metoprolol in addition to your hydrochlorothiazide for your blood pressure. We will refer you to cardiology for further evaluation. You may take the Lasix daily as needed for swelling. If you develop recurrent chest pain, shortness of breath, or any new or changing symptoms please seek medical attention immediately.

## 2016-04-08 NOTE — Assessment & Plan Note (Signed)
BP at goal here though not at goal at home. Discussed increasing hydrochlorothiazide versus adding an additional agent. Patient opted for an additional agent and we will add metoprolol. She'll monitor her blood pressure and let us know if it is not improving.

## 2016-04-08 NOTE — Assessment & Plan Note (Signed)
Somewhat typical and atypical features. Workup in the ED was unremarkable for cardiac cause. Given swelling this could be related to volume overload particularly given edema in her legs and weight gain over the last several months. Could also be bronchitis related given there were possible bronchitic changes on chest x-ray. Potentially could be anxiety related as well. We will refer to cardiology for further evaluation. We'll try Lasix when necessary to see if this will help with swelling. If she takes this frequently we will recheck lab work in a week. We will start her on metoprolol for her blood pressure as this would additionally benefit chest pain and potential heart failure as she is not in acute heart failure. She will continue to monitor. She's given return precautions.

## 2016-04-11 ENCOUNTER — Encounter: Payer: Self-pay | Admitting: Family Medicine

## 2016-04-11 NOTE — Telephone Encounter (Signed)
Please advise 

## 2016-04-12 ENCOUNTER — Other Ambulatory Visit (INDEPENDENT_AMBULATORY_CARE_PROVIDER_SITE_OTHER): Payer: BLUE CROSS/BLUE SHIELD

## 2016-04-12 ENCOUNTER — Ambulatory Visit: Payer: BLUE CROSS/BLUE SHIELD | Admitting: Family Medicine

## 2016-04-12 ENCOUNTER — Telehealth: Payer: Self-pay | Admitting: Radiology

## 2016-04-12 DIAGNOSIS — Z5181 Encounter for therapeutic drug level monitoring: Secondary | ICD-10-CM

## 2016-04-12 LAB — BASIC METABOLIC PANEL
BUN: 9 mg/dL (ref 6–23)
CHLORIDE: 104 meq/L (ref 96–112)
CO2: 29 mEq/L (ref 19–32)
Calcium: 9.6 mg/dL (ref 8.4–10.5)
Creatinine, Ser: 0.7 mg/dL (ref 0.40–1.20)
GFR: 96.3 mL/min (ref >60.00)
GLUCOSE: 143 mg/dL — AB (ref 70–99)
POTASSIUM: 3.5 meq/L (ref 3.5–5.1)
Sodium: 138 mEq/L (ref 135–145)

## 2016-04-12 NOTE — Addendum Note (Signed)
Addended by: Caryl Bis Shedrick Sarli G on: 04/12/2016 11:54 AM   Modules accepted: Orders

## 2016-04-12 NOTE — Telephone Encounter (Signed)
Orders placed.

## 2016-04-12 NOTE — Telephone Encounter (Signed)
Pt added onto lab schedule today, please place orders. Thank you .

## 2016-04-12 NOTE — Telephone Encounter (Signed)
Patient is scheduled for lab today please place order

## 2016-04-12 NOTE — Telephone Encounter (Signed)
Pt came in for labs, please place orders. Thank you.

## 2016-04-12 NOTE — Addendum Note (Signed)
Addended by: Arby Barrette on: 04/12/2016 12:42 PM   Modules accepted: Orders

## 2016-04-15 ENCOUNTER — Ambulatory Visit (INDEPENDENT_AMBULATORY_CARE_PROVIDER_SITE_OTHER): Payer: BLUE CROSS/BLUE SHIELD | Admitting: Cardiovascular Disease

## 2016-04-16 ENCOUNTER — Encounter: Payer: Self-pay | Admitting: Cardiovascular Disease

## 2016-04-16 ENCOUNTER — Ambulatory Visit (INDEPENDENT_AMBULATORY_CARE_PROVIDER_SITE_OTHER): Payer: BLUE CROSS/BLUE SHIELD | Admitting: Cardiovascular Disease

## 2016-04-16 ENCOUNTER — Encounter: Payer: Self-pay | Admitting: Family Medicine

## 2016-04-16 VITALS — BP 120/78 | HR 74 | Ht 67.0 in | Wt 239.8 lb

## 2016-04-16 DIAGNOSIS — R600 Localized edema: Secondary | ICD-10-CM

## 2016-04-16 DIAGNOSIS — R0600 Dyspnea, unspecified: Secondary | ICD-10-CM | POA: Diagnosis not present

## 2016-04-16 DIAGNOSIS — R0789 Other chest pain: Secondary | ICD-10-CM

## 2016-04-16 MED ORDER — FUROSEMIDE 20 MG PO TABS: 20.0000 mg | ORAL_TABLET | Freq: Every day | ORAL | 5 refills | Status: DC

## 2016-04-16 NOTE — Patient Instructions (Addendum)
Medication Instructions:  Your physician has recommended you make the following change in your medication:  DECREASE lasix to 20mg  once daily  Labwork: none  Testing/Procedures: Your physician has requested that you have an echocardiogram. Echocardiography is a painless test that uses sound waves to create images of your heart. It provides your doctor with information about the size and shape of your heart and how well your heart's chambers and valves are working. This procedure takes approximately one hour. There are no restrictions for this procedure.    Follow-Up: Your physician recommends that you schedule a follow-up appointment as needed with Dr. Fletcher Anon.     Any Other Special Instructions Will Be Listed Below (If Applicable). Please purchase knee -high, closed toe support stockings, 25mmHg. Wear during the day and remove at night.      If you need a refill on your cardiac medications before your next appointment, please call your pharmacy.  Echocardiogram An echocardiogram, or echocardiography, uses sound waves (ultrasound) to produce an image of your heart. The echocardiogram is simple, painless, obtained within a short period of time, and offers valuable information to your health care provider. The images from an echocardiogram can provide information such as:  Evidence of coronary artery disease (CAD).  Heart size.  Heart muscle function.  Heart valve function.  Aneurysm detection.  Evidence of a past heart attack.  Fluid buildup around the heart.  Heart muscle thickening.  Assess heart valve function. Tell a health care provider about:  Any allergies you have.  All medicines you are taking, including vitamins, herbs, eye drops, creams, and over-the-counter medicines.  Any problems you or family members have had with anesthetic medicines.  Any blood disorders you have.  Any surgeries you have had.  Any medical conditions you have.  Whether you are  pregnant or may be pregnant. What happens before the procedure? No special preparation is needed. Eat and drink normally. What happens during the procedure?  In order to produce an image of your heart, gel will be applied to your chest and a wand-like tool (transducer) will be moved over your chest. The gel will help transmit the sound waves from the transducer. The sound waves will harmlessly bounce off your heart to allow the heart images to be captured in real-time motion. These images will then be recorded.  You may need an IV to receive a medicine that improves the quality of the pictures. What happens after the procedure? You may return to your normal schedule including diet, activities, and medicines, unless your health care provider tells you otherwise. This information is not intended to replace advice given to you by your health care provider. Make sure you discuss any questions you have with your health care provider. Document Released: 02/09/2000 Document Revised: 09/30/2015 Document Reviewed: 10/19/2012 Elsevier Interactive Patient Education  2017 [employer].

## 2016-04-16 NOTE — Progress Notes (Signed)
Cardiology Office Note   Date:  04/16/2016   ID:  Melissa Shepherd, DOB November 14, 1970, MRN [id number]  PCP:  [name], MD  Cardiologist:   Kathlyn Sacramento, MD   Chief Complaint  Patient presents with  . other    LS 2016. Pt c/o dizziness. Reviewed meds with pt verbally.      History of Present Illness: Melissa Shepherd is a 46 y.o. female who was referred by Dr. Caryl Bis for evaluation of chest pain, shortness of breath and leg edema . She had previous CT coronary angiogram in 2013 at [facility] which showed normal coronary arteries with 0 calcium score. She was seen by me in 2016 for atypical chest pain. Treadmill stress test was normal. She has experienced recent leg edema, 10 pound weight gain and had one episode of chest pain. She works as a Theme park manager. She went to the emergency room on February 7 with these symptoms. She was noted to be hypertensive with blood pressure of 173/108. She had negative basic workup including normal BNP, troponin and routine labs. Chest x-ray showed mild pulmonary vascular congestion. She has been on small dose hydrochlorothiazide for many years for essential hypertension. Metoprolol was recently started in addition to furosemide. She is currently taking 40 mg once daily. In spite of that, she has not noticed improvement in leg edema that has not been much change in her weight. She had no recurrent chest pain.     Past Medical History:  Diagnosis Date  . Chicken pox   . Depression   . Frequent headaches   . Hypertension   . Migraine     Past Surgical History:  Procedure Laterality Date  . CESAREAN SECTION  1991. 2003.   2     Current Outpatient Prescriptions  Medication Sig Dispense Refill  . furosemide (LASIX) 20 MG tablet Take 40 mg by mouth daily as needed.    . furosemide (LASIX) 40 MG tablet Take 40 mg by mouth daily.     . hydrochlorothiazide (HYDRODIURIL) 12.5 MG tablet TAKE 1 TABLET (12.5 MG TOTAL) BY MOUTH DAILY. 30  tablet 6  . loperamide (IMODIUM) 2 MG capsule Take by mouth.    . metoprolol succinate (TOPROL-XL) 25 MG 24 hr tablet Take 1 tablet (25 mg total) by mouth daily. 90 tablet 3  . Multiple Vitamin (MULTIVITAMIN) tablet Take 1 tablet by mouth daily.    Marland Kitchen REXULTI 1 MG TABS   2  . sertraline (ZOLOFT) 50 MG tablet Take 100 mg by mouth daily.      No current facility-administered medications for this visit.     Allergies:   Patient has no known allergies.    Social History:  The patient  reports that she has been smoking Cigarettes.  She has been smoking about 0.50 packs per day. She has never used smokeless tobacco. She reports that she drinks alcohol. She reports that she does not use drugs.   Family History:  The patient's family history includes Cancer in her maternal grandmother and sister; Hypertension in her mother.    ROS:  Please see the history of present illness.   Otherwise, review of systems are positive for none.   All other systems are reviewed and negative.    PHYSICAL EXAM: VS:  BP 120/78 (BP Location: Left Arm, Patient Position: Sitting, Cuff Size: Normal)   Pulse 74   Ht 5\' 7"  (1.702 m)   Wt 239 lb 12 oz (108.7 kg)   BMI 37.55  kg/m  , BMI Body mass index is 37.55 kg/m. GEN: Well nourished, well developed, in no acute distress  HEENT: normal  Neck: no JVD, carotid bruits, or masses Cardiac: RRR; no murmurs, rubs, or gallops,mild edema  Respiratory:  clear to auscultation bilaterally, normal work of breathing GI: soft, nontender, nondistended, + BS MS: no deformity or atrophy  Skin: warm and dry, no rash Neuro:  Strength and sensation are intact Psych: euthymic mood, full affect   EKG:  EKG is ordered today. The ekg ordered today demonstrates  normal sinus rhythm with no significant changes.    Recent Labs: 08/18/2015: ALT 25 04/03/2016: B Natriuretic Peptide 57.0; Hemoglobin 12.8; Platelets 249 04/12/2016: BUN 9; Creatinine, Ser 0.70; Potassium 3.5; Sodium 138     Lipid Panel    Component Value Date/Time   CHOL 227 (H) 04/26/2015 1409   TRIG 206.0 (H) 04/26/2015 1409   HDL 51.60 04/26/2015 1409   CHOLHDL 4 04/26/2015 1409   VLDL 41.2 (H) 04/26/2015 1409   LDLCALC 110 08/03/2013   LDLDIRECT 147.0 04/26/2015 1409      Wt Readings from Last 3 Encounters:  04/16/16 239 lb 12 oz (108.7 kg)  04/08/16 242 lb 3.2 oz (109.9 kg)  04/03/16 230 lb (104.3 kg)       No flowsheet data found.    ASSESSMENT AND PLAN:  1.  Atypical chest pain: Likely noncardiac. No recurrent symptoms. CTA of the coronary arteries in 2013 was normal and treadmill stress test in 2016 was also normal. Thus, I do not recommend further stress testing at the present time.    2. 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 I don't see convincing evidence of congestive heart failure by physical exam. Recent BNP was also normal. I am going to check an echocardiogram for evaluation. I advised her to use knee-high support stockings during the day. She reports no significant improvement with furosemide and thus I decreased the dose to 20 mg daily to avoid volume depletion and electrolyte abnormalities especially that she is on hydrochlorothiazide as well.  3. Weight gain: Unclear etiology. I recommend checking thyroid function if that has not been already done.   Disposition:   FU with me as needed.   Signed,  Kathlyn Sacramento, MD  04/16/2016 4:47 PM    [place]

## 2016-04-17 ENCOUNTER — Encounter: Payer: Self-pay | Admitting: Family Medicine

## 2016-04-17 ENCOUNTER — Other Ambulatory Visit: Payer: Self-pay | Admitting: Family Medicine

## 2016-04-17 DIAGNOSIS — R635 Abnormal weight gain: Secondary | ICD-10-CM

## 2016-04-18 ENCOUNTER — Other Ambulatory Visit (INDEPENDENT_AMBULATORY_CARE_PROVIDER_SITE_OTHER): Payer: BLUE CROSS/BLUE SHIELD

## 2016-04-18 DIAGNOSIS — R635 Abnormal weight gain: Secondary | ICD-10-CM

## 2016-04-18 LAB — TSH: TSH: 0.76 u[IU]/mL (ref 0.35–4.50)

## 2016-05-01 ENCOUNTER — Ambulatory Visit (INDEPENDENT_AMBULATORY_CARE_PROVIDER_SITE_OTHER): Payer: BLUE CROSS/BLUE SHIELD

## 2016-05-01 ENCOUNTER — Other Ambulatory Visit: Payer: Self-pay

## 2016-05-01 DIAGNOSIS — R0600 Dyspnea, unspecified: Secondary | ICD-10-CM | POA: Diagnosis not present

## 2016-05-01 DIAGNOSIS — R600 Localized edema: Secondary | ICD-10-CM | POA: Diagnosis not present

## 2016-05-13 ENCOUNTER — Other Ambulatory Visit: Payer: BLUE CROSS/BLUE SHIELD

## 2016-06-04 ENCOUNTER — Ambulatory Visit: Payer: BLUE CROSS/BLUE SHIELD | Admitting: Nurse Practitioner

## 2016-06-05 DIAGNOSIS — I2089 Other forms of angina pectoris: Secondary | ICD-10-CM | POA: Insufficient documentation

## 2016-09-24 ENCOUNTER — Other Ambulatory Visit (HOSPITAL_COMMUNITY): Payer: Self-pay | Admitting: General Surgery

## 2016-09-24 DIAGNOSIS — N946 Dysmenorrhea, unspecified: Secondary | ICD-10-CM | POA: Insufficient documentation

## 2016-09-24 DIAGNOSIS — K8020 Calculus of gallbladder without cholecystitis without obstruction: Secondary | ICD-10-CM

## 2016-10-04 ENCOUNTER — Ambulatory Visit (HOSPITAL_COMMUNITY)
Admission: RE | Admit: 2016-10-04 | Discharge: 2016-10-04 | Disposition: A | Discharge status: Home / Self Care | Payer: BLUE CROSS/BLUE SHIELD | Source: Ambulatory Visit | Attending: General Surgery | Admitting: General Surgery

## 2016-10-04 DIAGNOSIS — K8020 Calculus of gallbladder without cholecystitis without obstruction: Secondary | ICD-10-CM

## 2016-10-04 DIAGNOSIS — K828 Other specified diseases of gallbladder: Secondary | ICD-10-CM | POA: Diagnosis not present

## 2016-10-04 MED ORDER — TECHNETIUM TC 99M MEBROFENIN IV KIT
5.4000 | PACK | Freq: Once | INTRAVENOUS | Status: AC | PRN
  Administered 2016-10-04: 5.4 via INTRAVENOUS

## 2016-11-07 ENCOUNTER — Other Ambulatory Visit: Payer: Self-pay | Admitting: General Surgery

## 2016-11-15 ENCOUNTER — Other Ambulatory Visit: Payer: Self-pay | Admitting: Family Medicine

## 2016-11-22 ENCOUNTER — Other Ambulatory Visit: Payer: Self-pay | Admitting: General Surgery

## 2016-11-22 ENCOUNTER — Encounter (HOSPITAL_COMMUNITY)
Admission: RE | Admit: 2016-11-22 | Discharge: 2016-11-22 | Disposition: A | Discharge status: Home / Self Care | Payer: BLUE CROSS/BLUE SHIELD | Source: Ambulatory Visit | Attending: General Surgery | Admitting: General Surgery

## 2016-11-22 ENCOUNTER — Encounter (HOSPITAL_COMMUNITY): Payer: Self-pay

## 2016-11-22 DIAGNOSIS — K8020 Calculus of gallbladder without cholecystitis without obstruction: Secondary | ICD-10-CM | POA: Diagnosis not present

## 2016-11-22 DIAGNOSIS — Z01810 Encounter for preprocedural cardiovascular examination: Secondary | ICD-10-CM | POA: Diagnosis not present

## 2016-11-22 DIAGNOSIS — R079 Chest pain, unspecified: Secondary | ICD-10-CM | POA: Insufficient documentation

## 2016-11-22 DIAGNOSIS — I10 Essential (primary) hypertension: Secondary | ICD-10-CM | POA: Diagnosis not present

## 2016-11-22 HISTORY — DX: Angina pectoris, unspecified: I20.9

## 2016-11-22 HISTORY — DX: Nontoxic multinodular goiter: E04.2

## 2016-11-22 HISTORY — DX: Diarrhea, unspecified: R19.7

## 2016-11-22 HISTORY — DX: Irritable bowel syndrome, unspecified: K58.9

## 2016-11-22 HISTORY — DX: Fatty (change of) liver, not elsewhere classified: K76.0

## 2016-11-22 HISTORY — DX: Unspecified convulsions: R56.9

## 2016-11-22 HISTORY — DX: Dyspnea, unspecified: R06.00

## 2016-11-22 HISTORY — DX: Anxiety disorder, unspecified: F41.9

## 2016-11-22 LAB — CBC WITH DIFFERENTIAL/PLATELET
BASOS ABS: 0 10*3/uL (ref 0.0–0.1)
BASOS PCT: 0 %
Eosinophils Absolute: 0.1 10*3/uL (ref 0.0–0.7)
Eosinophils Relative: 2 %
HCT: 42 % (ref 36.0–46.0)
Hemoglobin: 14 g/dL (ref 12.0–15.0)
Lymphocytes Relative: 32 %
Lymphs Abs: 2.2 10*3/uL (ref 0.7–4.0)
MCH: 29.9 pg (ref 26.0–34.0)
MCHC: 33.3 g/dL (ref 30.0–36.0)
MCV: 89.7 fL (ref 78.0–100.0)
MONO ABS: 0.4 10*3/uL (ref 0.1–1.0)
MONOS PCT: 6 %
NEUTROS ABS: 4.3 10*3/uL (ref 1.7–7.7)
Neutrophils Relative %: 60 %
PLATELETS: 229 10*3/uL (ref 150–400)
RBC: 4.68 MIL/uL (ref 3.87–5.11)
RDW: 14.4 % (ref 11.5–15.5)
WBC: 7.1 10*3/uL (ref 4.0–10.5)

## 2016-11-22 LAB — COMPREHENSIVE METABOLIC PANEL
ALBUMIN: 4.3 g/dL (ref 3.5–5.0)
ALT: 20 U/L (ref 14–54)
ANION GAP: 8 (ref 5–15)
AST: 21 U/L (ref 15–41)
Alkaline Phosphatase: 53 U/L (ref 38–126)
BILIRUBIN TOTAL: 0.4 mg/dL (ref 0.3–1.2)
BUN: 5 mg/dL — AB (ref 6–20)
CHLORIDE: 107 mmol/L (ref 101–111)
CO2: 24 mmol/L (ref 22–32)
Calcium: 9.4 mg/dL (ref 8.9–10.3)
Creatinine, Ser: 0.7 mg/dL (ref 0.44–1.00)
GFR calc Af Amer: >60 mL/min (ref >60)
GFR calc non Af Amer: >60 mL/min (ref >60)
GLUCOSE: 123 mg/dL — AB (ref 65–99)
POTASSIUM: 3.7 mmol/L (ref 3.5–5.1)
SODIUM: 139 mmol/L (ref 135–145)
TOTAL PROTEIN: 6.9 g/dL (ref 6.5–8.1)

## 2016-11-22 LAB — HCG, SERUM, QUALITATIVE: Preg, Serum: NEGATIVE

## 2016-11-22 NOTE — Pre-Procedure Instructions (Addendum)
Melissa Shepherd  11/22/2016      Your procedure is scheduled on Tuesday, October 2.  Report to [facility] Admitting at 11:30 AM                 Your surgery or procedure is scheduled for 1:30 PM   Call this number if you have problems the morning of surgery: [phone]- pre op desk   Remember:  Do not eat food or drink liquids after midnight Monday with exception, drink the carton of Breeze you were given at PAT appointment, drink by 9:30am.  Take these medicines the morning of surgery with A SIP OF WATER : metoprolol succinate (TOPROL-XL),    sertraline (ZOLOFT).                     Take if needed: ALPRAZolam Duanne Moron).              STOP taking Aspirin, Aspirin Products (Goody Powder, Excedrin Migraine), Ibuprofen (Advil), Naproxen (Aleve), Vitamins and Herbal Products (ie Fish Oil) Special instructions:   [place]- Preparing For Surgery  Before surgery, you can play an important role. Because skin is not sterile, your skin needs to be as free of germs as possible. You can reduce the number of germs on your skin by washing with CHG (chlorahexidine gluconate) Soap before surgery.  CHG is an antiseptic cleaner which kills germs and bonds with the skin to continue killing germs even after washing.  Please do not use if you have an allergy to CHG or antibacterial soaps. If your skin becomes reddened/irritated stop using the CHG.  Do not shave (including legs and underarms) for at least 48 hours prior to first CHG shower. It is OK to shave your face.  Please follow these instructions carefully.   1. Shower the NIGHT BEFORE SURGERY and the MORNING OF SURGERY with CHG.   2. If you chose to wash your hair, wash your hair first as usual with your normal shampoo.  3. After you shampoo, rinse your hair and body thoroughly to remove the shampoo.               Wash your face and private area with the soap you use at home, then rinse.  4. Use CHG as you would any other liquid  soap. You can apply CHG directly to the skin and wash gently with a scrungie or a clean washcloth.   5. Apply the CHG Soap to your body ONLY FROM THE NECK DOWN.  Do not use on open wounds or open sores. Avoid contact with your eyes, ears, mouth and genitals (private parts). Wash genitals (private parts) with your normal soap.  6. Wash thoroughly, paying special attention to the area where your surgery will be performed.  7. Thoroughly rinse your body with warm water from the neck down.  8. DO NOT shower/wash with your normal soap after using and rinsing off the CHG Soap.  9. Pat yourself dry with a CLEAN TOWEL.   10. Wear CLEAN PAJAMAS   Place CLEAN SHEETS on your bed the night of your first shower and DO NOT SLEEP WITH PETS. Do not apply any deodorants/lotions, powders or colognes. Please wear clean clothes to the hospital/surgery center.    Do not wear jewelry, make-up or nail polish.  Do not shave 48 hours prior to surgery.  Men may shave face and neck.  Do not bring valuables to the hospital.  [facility] is  not responsible for any belongings or valuables.  Contacts, dentures or bridgework may not be worn into surgery.  Leave your suitcase in the car.  After surgery it may be brought to your room.  For patients admitted to the hospital, discharge time will be determined by your treatment team.  Patients discharged the day of surgery will not be allowed to drive home.   Please read over the following fact sheets that you were given: Pain Booklet, Coughing and Deep Breathing, Surgical Site Infections.

## 2016-11-22 NOTE — Progress Notes (Signed)
Mrs Moye reports that she continues to experience chest pain.  Last time patient had chest pain was last night, 11/21/16.  Patient describes chest pain as shrap , mid sternum. Patient reports that she feels a bit short of breath, patient states that she used her husband's oxygen for a few minutes and the pain went away in 15 minutes.  Patient reports that chest pain starts at rest or with activity.  Patient has experienced chest pain for several years and has been seen by cardiologist.  Patient has seen 2 cardiologist in 2018. Patient saw Dr Nehemiah Massed in April of this year, she had a stress test.  Dr Alveria Apley progess note reported that patient should follow up in August.  Patient did not follow up.  I faxed a request to [place] office for stress test.  Chart give for anesthesia NP, Willeen Cass.

## 2016-11-23 NOTE — H&P (Signed)
Melissa Shepherd Location: [facility] Surgery Patient #: [id number] DOB: June 05, 1970 Married / Language: English / Race: White Female        History of Present Illness       The patient is a 46 year old female who presents for evaluation of gall stones. This is a 46 year old female who returns with her husband to discuss cholecystectomy. She was initially seen on September 23, 2016. She has chronic irritable  bowel syndrome and diarrhea but has been having some new symptoms more lately with bloating, lower substernal chest pain, and now more recently some nausea and vomiting. She's had a big cardiac workup with Dr. Fletcher Anon which was completely negative. Her ultrasound in [place] showed gallstones and borderline splenomegaly but a normal CBD. We went ahead with about a biliary scan which shows abnormal ejection fraction of 15%, normal being greater than 33%. No symptoms following ingestion of Ensure. She continues to have intermittent symptoms       I told her that most likely she was having gallbladder symptoms but there may be superimposed irritable bowel syndrome. We know she doesn't have cardiac disease. I offered to proceed with cholecystectomy as she would like to do that. She will be scheduled for laparoscopic cholecystectomy with cholangiogram, possible open cholecystectomy. I discussed the indications, details, techniques, and numerous risk with her and her husband. She is aware the risk of bleeding, infection, conversion to open laparotomy, postop bile leak requiring readmission, injury to adjacent organs requiring major reconstructive surgery, exacerbation of diarrhea, pancreatitis and other unforeseen problems. She understands all these issues well. All of her questions were answered. She agrees with this plan.     Comorbidities include C-section 2 through Pfannenstiel incision. Possible bipolar disorder on medication. Hypertension. Obesity. Borderline COPD. Endometriosis.  Leg edema. She is married with 2 children and works as a Theme park manager. Daily tobacco use. Quit alcohol 3 months ago.   Medication History  Imodium A-D (Oral) Specific strength unknown - Active. Metoprolol Succinate ER (25MG  Tablet ER 24HR, Oral) Active. HydroCHLOROthiazide (12.5MG  Tablet, Oral) Active. Sertraline HCl (100MG  Tablet, Oral) Active. Vitamin D3 (5000UNIT Capsule, Oral) Active. Lasix (20MG  Tablet, Oral) Active. Lopid (600MG  Tablet, Oral) Active. Xanax (1MG  Tablet, Oral) Active. Medications Reconciled  Vitals  Weight: 227.13 lb Height: 67in Body Surface Area: 2.13 m Body Mass Index: 35.57 kg/m  Pulse: 73 (Regular)  P.OX: 97% (Room air) BP: 118/84 (Sitting, Left Arm, Standard)       Physical Exam  General Mental Status-Alert. General Appearance-Not in acute distress. Build & Nutrition-Well nourished. Posture-Normal posture. Gait-Normal. Note: Pleasant and cooperative. BMI 35. Very talkative. Admits to anxiety about general anesthesia.   Head and Neck Head-normocephalic, atraumatic with no lesions or palpable masses. Trachea-midline. Thyroid Gland Characteristics - normal size and consistency and no palpable nodules.  Chest and Lung Exam Chest and lung exam reveals -on auscultation, normal breath sounds, no adventitious sounds and normal vocal resonance.  Cardiovascular Cardiovascular examination reveals -normal heart sounds, regular rate and rhythm with no murmurs and femoral artery auscultation bilaterally reveals normal pulses, no bruits, no thrills.  Abdomen Inspection Inspection of the abdomen reveals - No Hernias. Palpation/Percussion Palpation and Percussion of the abdomen reveal - Soft, Non Tender, No Rigidity (guarding), No hepatosplenomegaly and No Palpable abdominal masses. Note: Jewelry at the umbilicus. No tenderness or distention or mass. Pfannenstiel incision well healed.   Neurologic Neurologic  evaluation reveals -alert and oriented x 3 with no impairment of recent or remote memory, normal attention span  and ability to concentrate, normal sensation and normal coordination.  Musculoskeletal Normal Exam - Bilateral-Upper Extremity Strength Normal and Lower Extremity Strength Normal.    Assessment & Plan  GALLSTONES (K80.20)   Your gallbladder ultrasound shows gallstones. That study was done in March in [place] hepatobiliary scan shows abnormal gallbladder function, delayed ejection fraction of 15% where normal is greater than 33% Your symptoms of bloating, chest pain, nausea and vomiting are likely due to your gallbladder The diarrhea may be due to other causes such as irritable bowel syndrome and may persist after gallbladder surgery  Because of your symptoms and the 2 abnormal x-rays, cholecystectomy is recommended and you agree  you will be scheduled for laparoscopic cholecystectomy with cholangiogram, possible open cholecystectomy We have discussed the indications, techniques, and risk of the surgery in detail Please read over the patient information booklet one more time.  HYPERTENSION, ESSENTIAL (I10) HISTORY OF C-SECTION (G90.211) BMI 35.0-35.9,ADULT (Z68.35) BIPOLAR DISORDER (F31.9)    Edsel Petrin. Dalbert Batman, M.D., [facility] General and Minimally invasive Surgery Breast and Colorectal Surgery Office:   [phone] Pager:   [phone]

## 2016-11-25 MED ORDER — GABAPENTIN 300 MG PO CAPS
300.0000 mg | ORAL_CAPSULE | ORAL | Status: AC
  Administered 2016-11-26: 300 mg via ORAL
  Filled 2016-11-25: qty 1

## 2016-11-25 MED ORDER — CEFAZOLIN SODIUM-DEXTROSE 2-4 GM/100ML-% IV SOLN
2.0000 g | INTRAVENOUS | Status: AC
  Administered 2016-11-26: 2 g via INTRAVENOUS
  Filled 2016-11-25: qty 100

## 2016-11-25 MED ORDER — ACETAMINOPHEN 500 MG PO TABS
1000.0000 mg | ORAL_TABLET | ORAL | Status: AC
  Administered 2016-11-26: 1000 mg via ORAL
  Filled 2016-11-25: qty 2

## 2016-11-25 MED ORDER — CELECOXIB 200 MG PO CAPS
200.0000 mg | ORAL_CAPSULE | ORAL | Status: AC
  Administered 2016-11-26: 200 mg via ORAL
  Filled 2016-11-25: qty 1

## 2016-11-25 NOTE — Progress Notes (Signed)
Anesthesia Chart Review:   Pt is a 46 year old female scheduled for laparoscopic cholecystectomy with intraoperative cholangiogram, possible open on 11/26/2016 with Fanny Skates, MD  - PCP is Threasa Alpha, NP  PMH includes:  HTN, thyroid nodules, fatty liver.  Current smoker. BMI 36  - Pt reported at PAT she frequently has chest pain and that this has been on ongoing issue for years.  Pt was evaluated twice by cardiologists in 2018 for this problem without concerning findings.  Pt reports at PAT her sx have not changed in years and are the same sx she saw cardiology for.  Saw Kathlyn Sacramento, MD 04/16/16 (echo ordered, results below) and Serafina Royals, MD in 05/2016 (stress test ordered, results below) (notes in care everywhere).  Medications include: lasix, gemfibrozil, hctz, metoprolol  BP 132/83   Pulse 85   Temp 36.7 C   Resp 20   Ht 5\' 7"  (1.702 m)   Wt 228 lb 8 oz (103.6 kg)   LMP 11/07/2016   SpO2 100%   BMI 35.79 kg/m   Preoperative labs reviewed.    CXR 04/03/16: Mildly prominent bronchovascular markings secondary to vascular congestion or bronchitis/reactive airway disease.  EKG 11/22/16: NSR  Exercise stress test 06/17/16 [facility]):  - normal treadmill ECG without evidence of ischemia or arrhythmia  Echo 05/01/16:  - Left ventricle: The cavity size was normal. Wall thickness was increased in a pattern of mild LVH. Systolic function was vigorous. The estimated ejection fraction was in the range of 65% to 70%. Wall motion was normal; there were no regional wall motion abnormalities. Left ventricular diastolic function parameters were normal. - Right ventricle: The cavity size was normal. Systolic function was normal.  If no changes, I anticipate pt can proceed with surgery as scheduled.   Willeen Cass, FNP-BC [facility] Short Stay Surgical Center/Anesthesiology Phone: [phone] 11/25/2016 2:50 PM

## 2016-11-26 ENCOUNTER — Encounter (HOSPITAL_COMMUNITY): Payer: Self-pay | Admitting: Certified Registered"

## 2016-11-26 ENCOUNTER — Encounter (HOSPITAL_COMMUNITY)
Admission: RE | Disposition: A | Discharge status: Home / Self Care | Payer: Self-pay | Source: Ambulatory Visit | Attending: General Surgery

## 2016-11-26 ENCOUNTER — Ambulatory Visit (HOSPITAL_COMMUNITY)
Admission: RE | Admit: 2016-11-26 | Discharge: 2016-11-26 | Disposition: A | Discharge status: Home / Self Care | Payer: BLUE CROSS/BLUE SHIELD | Source: Ambulatory Visit | Attending: General Surgery | Admitting: General Surgery

## 2016-11-26 ENCOUNTER — Ambulatory Visit (HOSPITAL_COMMUNITY): Payer: BLUE CROSS/BLUE SHIELD | Admitting: Emergency Medicine

## 2016-11-26 ENCOUNTER — Ambulatory Visit (HOSPITAL_COMMUNITY): Payer: BLUE CROSS/BLUE SHIELD | Admitting: Certified Registered"

## 2016-11-26 ENCOUNTER — Ambulatory Visit (HOSPITAL_COMMUNITY): Payer: BLUE CROSS/BLUE SHIELD

## 2016-11-26 DIAGNOSIS — K8010 Calculus of gallbladder with chronic cholecystitis without obstruction: Secondary | ICD-10-CM | POA: Insufficient documentation

## 2016-11-26 DIAGNOSIS — Z6835 Body mass index (BMI) 35.0-35.9, adult: Secondary | ICD-10-CM | POA: Insufficient documentation

## 2016-11-26 DIAGNOSIS — K8012 Calculus of gallbladder with acute and chronic cholecystitis without obstruction: Secondary | ICD-10-CM | POA: Diagnosis present

## 2016-11-26 DIAGNOSIS — F17210 Nicotine dependence, cigarettes, uncomplicated: Secondary | ICD-10-CM | POA: Insufficient documentation

## 2016-11-26 DIAGNOSIS — I10 Essential (primary) hypertension: Secondary | ICD-10-CM | POA: Diagnosis not present

## 2016-11-26 DIAGNOSIS — Z419 Encounter for procedure for purposes other than remedying health state, unspecified: Secondary | ICD-10-CM

## 2016-11-26 DIAGNOSIS — K580 Irritable bowel syndrome with diarrhea: Secondary | ICD-10-CM | POA: Diagnosis not present

## 2016-11-26 DIAGNOSIS — Z79899 Other long term (current) drug therapy: Secondary | ICD-10-CM | POA: Insufficient documentation

## 2016-11-26 DIAGNOSIS — E669 Obesity, unspecified: Secondary | ICD-10-CM | POA: Diagnosis not present

## 2016-11-26 HISTORY — DX: Calculus of gallbladder with acute and chronic cholecystitis without obstruction: K80.12

## 2016-11-26 HISTORY — PX: CHOLECYSTECTOMY: SHX55

## 2016-11-26 SURGERY — LAPAROSCOPIC CHOLECYSTECTOMY WITH INTRAOPERATIVE CHOLANGIOGRAM
Anesthesia: General | Site: Abdomen

## 2016-11-26 MED ORDER — HYDROMORPHONE HCL 1 MG/ML IJ SOLN
INTRAMUSCULAR | Status: AC
  Filled 2016-11-26: qty 1

## 2016-11-26 MED ORDER — ONDANSETRON HCL 4 MG/2ML IJ SOLN
INTRAMUSCULAR | Status: AC
  Filled 2016-11-26: qty 2

## 2016-11-26 MED ORDER — CHLORHEXIDINE GLUCONATE CLOTH 2 % EX PADS: 6.0000 | MEDICATED_PAD | Freq: Once | CUTANEOUS | Status: DC

## 2016-11-26 MED ORDER — LACTATED RINGERS IV SOLN
INTRAVENOUS | Status: DC | PRN
  Administered 2016-11-26: 13:00:00 via INTRAVENOUS

## 2016-11-26 MED ORDER — LIDOCAINE 2% (20 MG/ML) 5 ML SYRINGE
INTRAMUSCULAR | Status: DC | PRN
  Administered 2016-11-26: 100 mg via INTRAVENOUS

## 2016-11-26 MED ORDER — ROCURONIUM BROMIDE 10 MG/ML (PF) SYRINGE
PREFILLED_SYRINGE | INTRAVENOUS | Status: AC
  Filled 2016-11-26: qty 5

## 2016-11-26 MED ORDER — BUPIVACAINE-EPINEPHRINE 0.5% -1:200000 IJ SOLN
INTRAMUSCULAR | Status: DC | PRN
  Administered 2016-11-26: 11 mL

## 2016-11-26 MED ORDER — OXYCODONE HCL 5 MG PO TABS: 5.0000 mg | ORAL_TABLET | ORAL | Status: DC | PRN

## 2016-11-26 MED ORDER — MIDAZOLAM HCL 5 MG/5ML IJ SOLN
INTRAMUSCULAR | Status: DC | PRN
  Administered 2016-11-26 (×2): 2 mg via INTRAVENOUS

## 2016-11-26 MED ORDER — MEPERIDINE HCL 25 MG/ML IJ SOLN: 6.2500 mg | INTRAMUSCULAR | Status: DC | PRN

## 2016-11-26 MED ORDER — HEMOSTATIC AGENTS (NO CHARGE) OPTIME
TOPICAL | Status: DC | PRN
  Administered 2016-11-26: 1

## 2016-11-26 MED ORDER — DEXAMETHASONE SODIUM PHOSPHATE 10 MG/ML IJ SOLN
INTRAMUSCULAR | Status: AC
  Filled 2016-11-26: qty 1

## 2016-11-26 MED ORDER — 0.9 % SODIUM CHLORIDE (POUR BTL) OPTIME
TOPICAL | Status: DC | PRN
  Administered 2016-11-26: 1000 mL

## 2016-11-26 MED ORDER — SUGAMMADEX SODIUM 200 MG/2ML IV SOLN
INTRAVENOUS | Status: DC | PRN
  Administered 2016-11-26: 200 mg via INTRAVENOUS

## 2016-11-26 MED ORDER — OXYCODONE HCL 5 MG/5ML PO SOLN: 5.0000 mg | Freq: Once | ORAL | Status: AC | PRN

## 2016-11-26 MED ORDER — ONDANSETRON HCL 4 MG/2ML IJ SOLN
INTRAMUSCULAR | Status: DC | PRN
  Administered 2016-11-26: 4 mg via INTRAVENOUS

## 2016-11-26 MED ORDER — HYDROMORPHONE HCL 1 MG/ML IJ SOLN
INTRAMUSCULAR | Status: AC
  Administered 2016-11-26: 0.5 mg via INTRAVENOUS
  Filled 2016-11-26: qty 1

## 2016-11-26 MED ORDER — HYDROMORPHONE HCL 1 MG/ML IJ SOLN
0.2500 mg | INTRAMUSCULAR | Status: DC | PRN
  Administered 2016-11-26 (×4): 0.5 mg via INTRAVENOUS

## 2016-11-26 MED ORDER — EPHEDRINE SULFATE 50 MG/ML IJ SOLN
INTRAMUSCULAR | Status: DC | PRN
  Administered 2016-11-26: 5 mg via INTRAVENOUS

## 2016-11-26 MED ORDER — SUGAMMADEX SODIUM 200 MG/2ML IV SOLN
INTRAVENOUS | Status: AC
  Filled 2016-11-26: qty 2

## 2016-11-26 MED ORDER — BUPIVACAINE-EPINEPHRINE (PF) 0.5% -1:200000 IJ SOLN
INTRAMUSCULAR | Status: AC
  Filled 2016-11-26: qty 30

## 2016-11-26 MED ORDER — LACTATED RINGERS IV SOLN: INTRAVENOUS | Status: DC

## 2016-11-26 MED ORDER — OXYCODONE HCL 5 MG PO TABS
ORAL_TABLET | ORAL | Status: AC
  Filled 2016-11-26: qty 1

## 2016-11-26 MED ORDER — LIDOCAINE 2% (20 MG/ML) 5 ML SYRINGE
INTRAMUSCULAR | Status: AC
  Filled 2016-11-26: qty 5

## 2016-11-26 MED ORDER — FENTANYL CITRATE (PF) 250 MCG/5ML IJ SOLN
INTRAMUSCULAR | Status: AC
  Filled 2016-11-26: qty 5

## 2016-11-26 MED ORDER — OXYCODONE HCL 5 MG PO TABS
5.0000 mg | ORAL_TABLET | Freq: Once | ORAL | Status: AC | PRN
  Administered 2016-11-26: 5 mg via ORAL

## 2016-11-26 MED ORDER — SODIUM CHLORIDE 0.9 % IV SOLN
INTRAVENOUS | Status: DC | PRN
  Administered 2016-11-26: 18 mL

## 2016-11-26 MED ORDER — FENTANYL CITRATE (PF) 100 MCG/2ML IJ SOLN
INTRAMUSCULAR | Status: DC | PRN
  Administered 2016-11-26: 100 ug via INTRAVENOUS
  Administered 2016-11-26 (×3): 50 ug via INTRAVENOUS

## 2016-11-26 MED ORDER — ROCURONIUM BROMIDE 100 MG/10ML IV SOLN
INTRAVENOUS | Status: DC | PRN
  Administered 2016-11-26: 50 mg via INTRAVENOUS
  Administered 2016-11-26: 10 mg via INTRAVENOUS

## 2016-11-26 MED ORDER — SODIUM CHLORIDE 0.9 % IR SOLN
Status: DC | PRN
  Administered 2016-11-26: 1000 mL

## 2016-11-26 MED ORDER — PROMETHAZINE HCL 25 MG/ML IJ SOLN
6.2500 mg | INTRAMUSCULAR | Status: DC | PRN
  Administered 2016-11-26: 12.5 mg via INTRAVENOUS

## 2016-11-26 MED ORDER — IOPAMIDOL (ISOVUE-300) INJECTION 61%
INTRAVENOUS | Status: AC
  Filled 2016-11-26: qty 50

## 2016-11-26 MED ORDER — PROPOFOL 10 MG/ML IV BOLUS
INTRAVENOUS | Status: AC
  Filled 2016-11-26: qty 20

## 2016-11-26 MED ORDER — MIDAZOLAM HCL 2 MG/2ML IJ SOLN
INTRAMUSCULAR | Status: AC
  Filled 2016-11-26: qty 2

## 2016-11-26 MED ORDER — PROMETHAZINE HCL 25 MG/ML IJ SOLN
INTRAMUSCULAR | Status: AC
  Filled 2016-11-26: qty 1

## 2016-11-26 MED ORDER — DEXAMETHASONE SODIUM PHOSPHATE 4 MG/ML IJ SOLN
INTRAMUSCULAR | Status: DC | PRN
  Administered 2016-11-26: 8 mg via INTRAVENOUS

## 2016-11-26 MED ORDER — PROPOFOL 10 MG/ML IV BOLUS
INTRAVENOUS | Status: DC | PRN
  Administered 2016-11-26: 170 mg via INTRAVENOUS

## 2016-11-26 MED ORDER — HYDROCODONE-ACETAMINOPHEN 5-325 MG PO TABS: 1.0000 | ORAL_TABLET | Freq: Four times a day (QID) | ORAL | 0 refills | Status: DC | PRN

## 2016-11-26 SURGICAL SUPPLY — 46 items
APPLICATOR ARISTA FLEXITIP XL (MISCELLANEOUS) ×2 IMPLANT
APPLIER CLIP ROT 10 11.4 M/L (STAPLE) ×2
CANISTER SUCT 3000ML PPV (MISCELLANEOUS) ×2 IMPLANT
CHLORAPREP W/TINT 26ML (MISCELLANEOUS) ×2 IMPLANT
CLIP APPLIE ROT 10 11.4 M/L (STAPLE) ×1 IMPLANT
COVER MAYO STAND STRL (DRAPES) ×2 IMPLANT
COVER SURGICAL LIGHT HANDLE (MISCELLANEOUS) ×2 IMPLANT
DERMABOND ADVANCED (GAUZE/BANDAGES/DRESSINGS) ×1
DERMABOND ADVANCED .7 DNX12 (GAUZE/BANDAGES/DRESSINGS) ×1 IMPLANT
DRAPE C-ARM 42X72 X-RAY (DRAPES) ×2 IMPLANT
ELECT REM PT RETURN 9FT ADLT (ELECTROSURGICAL) ×2
ELECTRODE REM PT RTRN 9FT ADLT (ELECTROSURGICAL) ×1 IMPLANT
GLOVE BIO SURGEON STRL SZ7 (GLOVE) ×2 IMPLANT
GLOVE BIOGEL PI IND STRL 7.0 (GLOVE) ×2 IMPLANT
GLOVE BIOGEL PI IND STRL 7.5 (GLOVE) ×2 IMPLANT
GLOVE BIOGEL PI INDICATOR 7.0 (GLOVE) ×2
GLOVE BIOGEL PI INDICATOR 7.5 (GLOVE) ×2
GLOVE ECLIPSE 6.5 STRL STRAW (GLOVE) ×2 IMPLANT
GLOVE ECLIPSE 7.5 STRL STRAW (GLOVE) ×4 IMPLANT
GLOVE EUDERMIC 7 POWDERFREE (GLOVE) ×2 IMPLANT
GLOVE SURG SIGNA 7.5 PF LTX (GLOVE) ×2 IMPLANT
GOWN STRL REUS W/ TWL LRG LVL3 (GOWN DISPOSABLE) ×4 IMPLANT
GOWN STRL REUS W/ TWL XL LVL3 (GOWN DISPOSABLE) ×2 IMPLANT
GOWN STRL REUS W/TWL 2XL LVL3 (GOWN DISPOSABLE) ×2 IMPLANT
GOWN STRL REUS W/TWL LRG LVL3 (GOWN DISPOSABLE) ×4
GOWN STRL REUS W/TWL XL LVL3 (GOWN DISPOSABLE) ×2
HEMOSTAT ARISTA ABSORB 3G PWDR (MISCELLANEOUS) ×2 IMPLANT
KIT BASIN OR (CUSTOM PROCEDURE TRAY) ×2 IMPLANT
KIT ROOM TURNOVER OR (KITS) ×2 IMPLANT
NS IRRIG 1000ML POUR BTL (IV SOLUTION) ×2 IMPLANT
PAD ARMBOARD 7.5X6 YLW CONV (MISCELLANEOUS) ×2 IMPLANT
POUCH SPECIMEN RETRIEVAL 10MM (ENDOMECHANICALS) ×2 IMPLANT
SCISSORS LAP 5X35 DISP (ENDOMECHANICALS) ×2 IMPLANT
SET CHOLANGIOGRAPH 5 50 .035 (SET/KITS/TRAYS/PACK) ×2 IMPLANT
SET IRRIG TUBING LAPAROSCOPIC (IRRIGATION / IRRIGATOR) ×2 IMPLANT
SLEEVE ENDOPATH XCEL 5M (ENDOMECHANICALS) ×2 IMPLANT
SPECIMEN JAR SMALL (MISCELLANEOUS) ×2 IMPLANT
SUT MNCRL AB 4-0 PS2 18 (SUTURE) ×4 IMPLANT
SUT VICRYL 0 UR6 27IN ABS (SUTURE) ×2 IMPLANT
TOWEL OR 17X24 6PK STRL BLUE (TOWEL DISPOSABLE) ×2 IMPLANT
TOWEL OR 17X26 10 PK STRL BLUE (TOWEL DISPOSABLE) ×2 IMPLANT
TRAY LAPAROSCOPIC MC (CUSTOM PROCEDURE TRAY) ×2 IMPLANT
TROCAR XCEL BLUNT TIP 100MML (ENDOMECHANICALS) ×2 IMPLANT
TROCAR XCEL NON-BLD 11X100MML (ENDOMECHANICALS) ×2 IMPLANT
TROCAR XCEL NON-BLD 5MMX100MML (ENDOMECHANICALS) ×2 IMPLANT
TUBING INSUFFLATION (TUBING) ×2 IMPLANT

## 2016-11-26 NOTE — Anesthesia Preprocedure Evaluation (Signed)
Anesthesia Evaluation  Patient identified by MRN, date of birth, ID band Patient awake    Reviewed: Allergy & Precautions, NPO status , Patient's Chart, lab work & pertinent test results, reviewed documented beta blocker date and time   Airway Mallampati: II  TM Distance: >3 FB Neck ROM: Full    Dental no notable dental hx.    Pulmonary neg pulmonary ROS, Current Smoker,    Pulmonary exam normal breath sounds clear to auscultation       Cardiovascular hypertension, Pt. on medications and Pt. on home beta blockers negative cardio ROS Normal cardiovascular exam Rhythm:Regular Rate:Normal     Neuro/Psych Anxiety Depression negative neurological ROS  negative psych ROS   GI/Hepatic negative GI ROS, Neg liver ROS,   Endo/Other  negative endocrine ROS  Renal/GU negative Renal ROS  negative genitourinary   Musculoskeletal negative musculoskeletal ROS (+)   Abdominal   Peds negative pediatric ROS (+)  Hematology negative hematology ROS (+)   Anesthesia Other Findings   Reproductive/Obstetrics negative OB ROS                             Anesthesia Physical Anesthesia Plan  ASA: II  Anesthesia Plan: General   Post-op Pain Management:    Induction: Intravenous  PONV Risk Score and Plan: 2 and Ondansetron and Midazolam  Airway Management Planned: Oral ETT  Additional Equipment:   Intra-op Plan:   Post-operative Plan: Extubation in OR  Informed Consent: I have reviewed the patients History and Physical, chart, labs and discussed the procedure including the risks, benefits and alternatives for the proposed anesthesia with the patient or authorized representative who has indicated his/her understanding and acceptance.   Dental advisory given  Plan Discussed with: CRNA  Anesthesia Plan Comments:         Anesthesia Quick Evaluation

## 2016-11-26 NOTE — Anesthesia Postprocedure Evaluation (Signed)
Anesthesia Post Note  Patient: Melissa Shepherd  Procedure(s) Performed: LAPAROSCOPIC CHOLECYSTECTOMY WITH INTRAOPERATIVE CHOLANGIOGRAM (N/A Abdomen)     Patient location during evaluation: PACU Anesthesia Type: General Level of consciousness: awake and alert and oriented Pain management: pain level controlled Vital Signs Assessment: post-procedure vital signs reviewed and stable Respiratory status: spontaneous breathing, nonlabored ventilation and respiratory function stable Cardiovascular status: blood pressure returned to baseline and stable Postop Assessment: no apparent nausea or vomiting Anesthetic complications: no    Last Vitals:  Vitals:   11/26/16 1510 11/26/16 1527  BP: 108/70 (!) 141/88  Pulse: 69   Resp: 13   Temp:    SpO2: 92% 97%    Last Pain:  Vitals:   11/26/16 1527  TempSrc:   PainSc: 0-No pain                 Areesha Dehaven A.

## 2016-11-26 NOTE — Discharge Instructions (Signed)
[facility] ______[facility] °LAPAROSCOPIC SURGERY: POST OP INSTRUCTIONS °Always review your discharge instruction sheet given to you by the facility where your surgery was performed. °IF YOU HAVE DISABILITY OR FAMILY LEAVE FORMS, YOU MUST BRING THEM TO THE OFFICE FOR PROCESSING.   °DO NOT GIVE THEM TO YOUR DOCTOR. ° °1. A prescription for pain medication may be given to you upon discharge.  Take your pain medication as prescribed, if needed.  If narcotic pain medicine is not needed, then you may take acetaminophen (Tylenol) or ibuprofen (Advil) as needed. °2. Take your usually prescribed medications unless otherwise directed. °3. If you need a refill on your pain medication, please contact your pharmacy.  They will contact our office to request authorization. Prescriptions will not be filled after 5pm or on week-ends. °4. You should follow a light diet the first few days after arrival home, such as soup and crackers, etc.  Be sure to include lots of fluids daily. °5. Most patients will experience some swelling and bruising in the area of the incisions.  Ice packs will help.  Swelling and bruising can take several days to resolve.  °6. It is common to experience some constipation if taking pain medication after surgery.  Increasing fluid intake and taking a stool softener (such as Colace) will usually help or prevent this problem from occurring.  A mild laxative (Milk of Magnesia or Miralax) should be taken according to package instructions if there are no bowel movements after 48 hours. °7. Unless discharge instructions indicate otherwise, you may remove your bandages 24-48 hours after surgery, and you may shower at that time.  You may have steri-strips (small skin tapes) in place directly over the incision.  These strips should be left on the skin for 7-10 days.  If your surgeon used skin glue on the incision, you may shower in 24 hours.  The glue will flake off over the next 2-3 weeks.  Any sutures or  staples will be removed at the office during your follow-up visit. °8. ACTIVITIES:  You may resume regular (light) daily activities beginning the next day--such as daily self-care, walking, climbing stairs--gradually increasing activities as tolerated.  You may have sexual intercourse when it is comfortable.  Refrain from any heavy lifting or straining until approved by your doctor. °a. You may drive when you are no longer taking prescription pain medication, you can comfortably wear a seatbelt, and you can safely maneuver your car and apply brakes. °b. RETURN TO WORK:  __________________________________________________________ °9. You should see your doctor in the office for a follow-up appointment approximately 2-3 weeks after your surgery.  Make sure that you call for this appointment within a day or two after you arrive home to insure a convenient appointment time. °10. OTHER INSTRUCTIONS: __________________________________________________________________________________________________________________________ __________________________________________________________________________________________________________________________ °WHEN TO CALL YOUR DOCTOR: °1. Fever over 101.0 °2. Inability to urinate °3. Continued bleeding from incision. °4. Increased pain, redness, or drainage from the incision. °5. Increasing abdominal pain ° °The clinic staff is available to answer your questions during regular business hours.  Please don’t hesitate to call and ask to speak to one of the nurses for clinical concerns.  If you have a medical emergency, go to the nearest emergency room or call 911.  A surgeon from [facility] is always on call at the hospital. °[address], [address], [place], [place]  [address] ? [address] [address], [place], [place]   [address] °[phone] ? [phone] ? FAX [phone] °Web site:   [email or web address] °

## 2016-11-26 NOTE — Anesthesia Procedure Notes (Signed)
Procedure Name: Intubation Date/Time: 11/26/2016 1:30 PM Performed by: Orlie Dakin Pre-anesthesia Checklist: Emergency Drugs available, Patient identified, Suction available, Patient being monitored and Timeout performed Patient Re-evaluated:Patient Re-evaluated prior to induction Oxygen Delivery Method: Circle system utilized Preoxygenation: Pre-oxygenation with 100% oxygen Induction Type: IV induction Ventilation: Mask ventilation without difficulty Laryngoscope Size: Miller and 3 Grade View: Grade I Tube type: Oral Tube size: 7.0 mm Number of attempts: 1 Airway Equipment and Method: Stylet Placement Confirmation: positive ETCO2,  breath sounds checked- equal and bilateral and ETT inserted through vocal cords under direct vision Secured at: 23 cm Tube secured with: Tape Dental Injury: Teeth and Oropharynx as per pre-operative assessment  Comments: 4x4s bite block used.

## 2016-11-26 NOTE — Transfer of Care (Signed)
Immediate Anesthesia Transfer of Care Note  Patient: Melissa Shepherd  Procedure(s) Performed: LAPAROSCOPIC CHOLECYSTECTOMY WITH INTRAOPERATIVE CHOLANGIOGRAM (N/A Abdomen)  Patient Location: PACU  Anesthesia Type:General  Level of Consciousness: awake and patient cooperative  Airway & Oxygen Therapy: Patient Spontanous Breathing and Patient connected to nasal cannula oxygen  Post-op Assessment: Report given to RN and Post -op Vital signs reviewed and stable  Post vital signs: Reviewed and stable  Last Vitals:  Vitals:   11/26/16 1146 11/26/16 1455  BP: 123/70 (!) 145/72  Pulse: 60 77  Resp: 19   Temp: 36.6 C 36.4 C  SpO2: 97% 98%    Last Pain:  Vitals:   11/26/16 1455  TempSrc:   PainSc: 0-No pain      Patients Stated Pain Goal: 3 (21/11/73 5670)  Complications: No apparent anesthesia complications

## 2016-11-26 NOTE — Op Note (Signed)
Patient Name:           Melissa Shepherd   Date of Surgery:        11/26/2016  Pre op Diagnosis:      Chronic cholecystitis with cholelithiasis  Post op Diagnosis:    Same  Procedure:                 Laparoscopic cholecystectomy with cholangiogram  Surgeon:                     Edsel Petrin. Dalbert Batman, M.D., FACS  Assistant:                      Coralie Keens M.D.   Indication for Assistant: Complex adhesions, complex exposure, complex aberrant anatomy  Operative Indications:     The patient is a 46 year old female who presents for surgical management  of gall stones. She was initially seen on September 23, 2016. She has chronic irritable  bowel syndrome and diarrhea but has been having some new symptoms more lately with bloating, lower substernal chest pain, and now more recently some nausea and vomiting. She's had a big cardiac workup with Dr. Fletcher Anon which was completely negative. Her ultrasound in [place] showed gallstones and borderline splenomegaly but a normal CBD. We went ahead with about a biliary scan which shows abnormal ejection fraction of 15%, normal being greater than 33%. No symptoms following ingestion of Ensure. She continues to have intermittent symptoms       I told her that most likely she was having gallbladder symptoms but there may be superimposed irritable bowel syndrome. We know she doesn't have cardiac disease. I offered to proceed with cholecystectomy as she would like to do that. She will be scheduled for laparoscopic cholecystectomy with cholangiogram, possible open cholecystectomy. I discussed the indications, details, techniques, and numerous risk with her and her husband.  She agrees with this plan.       Operative Findings:       The gallbladder was chronically inflamed.  The gallbladder contained numerous bulky gallstones.  There were extensive severe adhesions from the midpoint of the fundus all the way down to the common duct.  We had to peel omental adhesions  in the colon off of the gallbladder.  We are able to pull the duodenum down fairly easily.  Dissection of the cystic duct and cystic artery were slow but uneventful.  The cholangiogram showed that the short  cystic duct came off of an accessory right hepatic duct but all of the intrahepatic ducts including the accessory right hepatic duct, main right hepatic duct and left hepatic duct common bile duct had normal anatomy.  There was no filling defect.  There was no obstruction with good flow of contrast into the duodenum.  The rest of the intra-abdominal viscera look normal.  Procedure in Detail:          Following the induction of general endotracheal anesthesia the patient's abdomen was prepped and draped in a sterile fashion.  Surgical timeout was performed.  Intravenous antibiotics  were given.  0.5% Marcaine with epinephrine was used as local infiltration anesthetic.     A vertical incision was made at the lower rim of the umbilicus.  The fascia was incised in the midline and the abdominal cavity entered under direct vision.  An 11 mm Hassan trocar was inserted and secured with the Purstring suture of 0 Vicryl.  Pneumoperitoneum was treated.  Video camera  was inserted.  A trochars placed in subxiphoid region and 2 trochars placed in the right upper quadrant.  We revealed elevated the fundus and slowly take the adhesions down.  We take this down slowly to the infundibulum and atraumatically dissected the colon and duodenum away from the infundibulum.  We then dissected out the cystic artery and cystic duct.  We did this very carefully draining a large window.  We control the cystic artery with multiple medical clips and divided.  I created an even larger window behind the cystic duct.  Cholangiogram was obtained using the C-arm.  The cholangiogram showed showed anatomy as described above.  The cholangiogram catheter was removed.  The cystic duct was secured with multiple metal clips and divided.  The  gallbladder was dissected from its bed with electrocautery placed in a specimen bag and removed.  The operative field was copiously irrigated and cauterized.  It was fairly raw.  After irrigation fluid looks pretty good I sprayed Arista hemostatic powder on the gallbladder bed and it remained completely hemostatic.  There is no green bile leaking.     The trochars were removed under direct vision.  There was no bleeding.  The fascia and down like this was closed with a figure-of-eight suture of 0 Vicryl in a Purstring suture of 0 Vicryl.  This was quite secure.  All the skin incisions were closed with subcuticular sutures of 4-0 Monocryl and Dermabond.  The patient tolerated the procedure well was taken to PACU in stable condition.  EBL 25 mL or less.  Counts correct.  Complications none     Sacoya Mcgourty M. Dalbert Batman, M.D., FACS General and Minimally Invasive Surgery Breast and Colorectal Surgery   Addendum: I logged on to the [facility]  website and reviewed her prescription medication history  11/26/2016 2:44 PM

## 2016-11-26 NOTE — Interval H&P Note (Signed)
History and Physical Interval Note:  11/26/2016 12:53 PM  Melissa Shepherd  has presented today for surgery, with the diagnosis of GALLSTONES   The various methods of treatment have been discussed with the patient and family. After consideration of risks, benefits and other options for treatment, the patient has consented to  Procedure(s) with comments: [facility] CHOLANGIOGRAM POSSIBLE OPEN (N/A) - POSSIBLE OPEN  as a surgical intervention .  The patient's history has been reviewed, patient examined, no change in status, stable for surgery.  I have reviewed the patient's chart and labs.  Questions were answered to the patient's satisfaction.     Adin Hector

## 2016-11-27 ENCOUNTER — Encounter (HOSPITAL_COMMUNITY): Payer: Self-pay | Admitting: General Surgery

## 2017-03-19 DIAGNOSIS — M179 Osteoarthritis of knee, unspecified: Secondary | ICD-10-CM | POA: Insufficient documentation

## 2017-05-07 ENCOUNTER — Ambulatory Visit
Admission: RE | Admit: 2017-05-07 | Discharge: 2017-05-07 | Disposition: A | Discharge status: Home / Self Care | Payer: BLUE CROSS/BLUE SHIELD | Source: Ambulatory Visit | Attending: Family Medicine | Admitting: Family Medicine

## 2017-05-07 ENCOUNTER — Other Ambulatory Visit: Payer: Self-pay | Admitting: Family Medicine

## 2017-05-07 DIAGNOSIS — S20219A Contusion of unspecified front wall of thorax, initial encounter: Secondary | ICD-10-CM | POA: Insufficient documentation

## 2017-05-07 DIAGNOSIS — X58XXXA Exposure to other specified factors, initial encounter: Secondary | ICD-10-CM | POA: Diagnosis not present

## 2017-05-10 ENCOUNTER — Other Ambulatory Visit: Payer: Self-pay

## 2017-05-10 ENCOUNTER — Emergency Department (HOSPITAL_BASED_OUTPATIENT_CLINIC_OR_DEPARTMENT_OTHER)
Admission: EM | Admit: 2017-05-10 | Discharge: 2017-05-10 | Disposition: A | Discharge status: Home / Self Care | Payer: BLUE CROSS/BLUE SHIELD | Attending: Emergency Medicine | Admitting: Emergency Medicine

## 2017-05-10 ENCOUNTER — Encounter (HOSPITAL_BASED_OUTPATIENT_CLINIC_OR_DEPARTMENT_OTHER): Payer: Self-pay | Admitting: *Deleted

## 2017-05-10 ENCOUNTER — Emergency Department (HOSPITAL_BASED_OUTPATIENT_CLINIC_OR_DEPARTMENT_OTHER): Payer: BLUE CROSS/BLUE SHIELD

## 2017-05-10 DIAGNOSIS — R0789 Other chest pain: Secondary | ICD-10-CM | POA: Diagnosis not present

## 2017-05-10 DIAGNOSIS — I10 Essential (primary) hypertension: Secondary | ICD-10-CM | POA: Diagnosis not present

## 2017-05-10 DIAGNOSIS — R1010 Upper abdominal pain, unspecified: Secondary | ICD-10-CM | POA: Diagnosis not present

## 2017-05-10 DIAGNOSIS — S301XXD Contusion of abdominal wall, subsequent encounter: Secondary | ICD-10-CM | POA: Diagnosis not present

## 2017-05-10 DIAGNOSIS — S20219D Contusion of unspecified front wall of thorax, subsequent encounter: Secondary | ICD-10-CM | POA: Diagnosis not present

## 2017-05-10 DIAGNOSIS — F17210 Nicotine dependence, cigarettes, uncomplicated: Secondary | ICD-10-CM | POA: Insufficient documentation

## 2017-05-10 DIAGNOSIS — Z9181 History of falling: Secondary | ICD-10-CM | POA: Insufficient documentation

## 2017-05-10 DIAGNOSIS — X58XXXD Exposure to other specified factors, subsequent encounter: Secondary | ICD-10-CM | POA: Insufficient documentation

## 2017-05-10 DIAGNOSIS — Z79899 Other long term (current) drug therapy: Secondary | ICD-10-CM | POA: Insufficient documentation

## 2017-05-10 LAB — COMPREHENSIVE METABOLIC PANEL
ALBUMIN: 4.2 g/dL (ref 3.5–5.0)
ALT: 17 U/L (ref 14–54)
AST: 19 U/L (ref 15–41)
Alkaline Phosphatase: 63 U/L (ref 38–126)
Anion gap: 11 (ref 5–15)
BUN: 7 mg/dL (ref 6–20)
CHLORIDE: 104 mmol/L (ref 101–111)
CO2: 26 mmol/L (ref 22–32)
Calcium: 9.4 mg/dL (ref 8.9–10.3)
Creatinine, Ser: 0.89 mg/dL (ref 0.44–1.00)
GFR calc Af Amer: >60 mL/min (ref >60)
GFR calc non Af Amer: >60 mL/min (ref >60)
GLUCOSE: 93 mg/dL (ref 65–99)
POTASSIUM: 3.6 mmol/L (ref 3.5–5.1)
Sodium: 141 mmol/L (ref 135–145)
Total Bilirubin: 0.3 mg/dL (ref 0.3–1.2)
Total Protein: 7.2 g/dL (ref 6.5–8.1)

## 2017-05-10 LAB — CBC WITH DIFFERENTIAL/PLATELET
BASOS ABS: 0 10*3/uL (ref 0.0–0.1)
BASOS PCT: 0 %
EOS PCT: 2 %
Eosinophils Absolute: 0.2 10*3/uL (ref 0.0–0.7)
HCT: 40.4 % (ref 36.0–46.0)
Hemoglobin: 13.8 g/dL (ref 12.0–15.0)
Lymphocytes Relative: 26 %
Lymphs Abs: 1.8 10*3/uL (ref 0.7–4.0)
MCH: 31.4 pg (ref 26.0–34.0)
MCHC: 34.2 g/dL (ref 30.0–36.0)
MCV: 92 fL (ref 78.0–100.0)
MONO ABS: 0.6 10*3/uL (ref 0.1–1.0)
Monocytes Relative: 9 %
Neutro Abs: 4.3 10*3/uL (ref 1.7–7.7)
Neutrophils Relative %: 63 %
PLATELETS: 241 10*3/uL (ref 150–400)
RBC: 4.39 MIL/uL (ref 3.87–5.11)
RDW: 14.6 % (ref 11.5–15.5)
WBC: 7 10*3/uL (ref 4.0–10.5)

## 2017-05-10 LAB — LIPASE, BLOOD: LIPASE: 30 U/L (ref 11–51)

## 2017-05-10 LAB — HCG, SERUM, QUALITATIVE: Preg, Serum: NEGATIVE

## 2017-05-10 MED ORDER — IBUPROFEN 800 MG PO TABS: 800.0000 mg | ORAL_TABLET | Freq: Three times a day (TID) | ORAL | 0 refills | Status: DC

## 2017-05-10 MED ORDER — ONDANSETRON HCL 4 MG/2ML IJ SOLN
4.0000 mg | Freq: Once | INTRAMUSCULAR | Status: AC
  Administered 2017-05-10: 4 mg via INTRAVENOUS
  Filled 2017-05-10: qty 2

## 2017-05-10 MED ORDER — HYDROCODONE-ACETAMINOPHEN 5-325 MG PO TABS: 1.0000 | ORAL_TABLET | ORAL | 0 refills | Status: DC | PRN

## 2017-05-10 MED ORDER — IOPAMIDOL (ISOVUE-300) INJECTION 61%
100.0000 mL | Freq: Once | INTRAVENOUS | Status: AC | PRN
  Administered 2017-05-10: 100 mL via INTRAVENOUS

## 2017-05-10 MED ORDER — MORPHINE SULFATE (PF) 4 MG/ML IV SOLN
4.0000 mg | Freq: Once | INTRAVENOUS | Status: AC
  Administered 2017-05-10: 4 mg via INTRAVENOUS
  Filled 2017-05-10: qty 1

## 2017-05-10 MED ORDER — SODIUM CHLORIDE 0.9 % IV BOLUS (SEPSIS)
1000.0000 mL | Freq: Once | INTRAVENOUS | Status: AC
  Administered 2017-05-10: 1000 mL via INTRAVENOUS

## 2017-05-10 NOTE — ED Provider Notes (Signed)
I received this patient in signout from Dr. Leonides Schanz.  We were awaiting CT scans to evaluate for traumatic injury after her fall several days ago.  CT was reassuring with no acute traumatic findings in chest, abdomen, or pelvis.  Patient well-appearing on reassessment.  Provided with incentive spirometer and pain medications to help her with breathing and ADLs at home. Controlled substance database reviewed prior to prescribing medications. She will follow-up with PCP in a few days.   Kieren Adkison, Wenda Overland, MD 05/10/17 [phone]

## 2017-05-10 NOTE — Discharge Instructions (Signed)
USE INCENTIVE SPIROMETER TO HELP WITH YOUR BREATHING. RETURN TO ER IF YOU HAVE SEVERE BREATHING PROBLEMS, FEVER, VOMITING, OR SEVERE WORSENING OF PAIN.  You have been prescribed narcotic medications to be used as needed for severe pain. Do not operate heavy machinery, drive, or work while taking these medications. These medications contain tylenol, so do not take tylenol while taking these medications.

## 2017-05-10 NOTE — ED Provider Notes (Signed)
TIME SEEN: 6:18 AM  CHIEF COMPLAINT: fall  HPI: Patient is a 47 year old female with history of hypertension, hyperlipidemia who presents to the emergency department with complaints of lower chest and upper abdominal pain after she fell 4 days ago.  States she was standing on a small child's chair in her kitchen trying to move a plant that was on top of her cabinet.  States the chair slipped out from underneath her and she fell forward striking her lower chest and upper abdomen on a marble countertop.  No head injury or loss of consciousness.  States pain has progressively worsened and now she is feeling lightheaded.  States it is painful to move and also to take a deep breath.  She is not on any blood thinners.  No neck or back pain.  Was seen by her primary care physician this week and had a negative chest x-ray.  Was given IM Toradol with good pain relief.  ROS: See HPI Constitutional: no fever  Eyes: no drainage  ENT: no runny nose   Cardiovascular:  no chest pain  Resp: no SOB  GI: no vomiting GU: no dysuria Integumentary: no rash  Allergy: no hives  Musculoskeletal: no leg swelling  Neurological: no slurred speech ROS otherwise negative  PAST MEDICAL HISTORY/PAST SURGICAL HISTORY:  Past Medical History:  Diagnosis Date  . Anginal pain ([place])   . Anxiety    panic attacks  . Chicken pox   . Cholelithiasis with acute on chronic cholecystitis without biliary obstruction 11/26/2016  . Depression   . Diarrhea   . Dyspnea    with chest pain  . Fatty liver   . Frequent headaches   . Hypertension   . IBS (irritable bowel syndrome)   . Multiple thyroid nodules   . 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 Authorizing Provider  ALPRAZolam (XANAX XR) 0.5 MG 24 hr tablet Take 0.5-1 mg by mouth 2 (two) times daily as needed for anxiety.    [provider]  ALPRAZolam Duanne Moron) 1 MG  tablet Take 0.5-1 mg by mouth at bedtime as needed for anxiety.    [provider]  Cholecalciferol (VITAMIN D-3) 5000 units TABS Take 1 tablet by mouth daily.    [provider]  furosemide (LASIX) 20 MG tablet Take 1 tablet (20 mg total) by mouth daily. 04/16/16 11/20/16  Wellington Hampshire, MD  gemfibrozil (LOPID) 600 MG tablet Take 600 mg by mouth 2 (two) times daily before a meal.    [provider]  hydrochlorothiazide (HYDRODIURIL) 12.5 MG tablet TAKE 1 TABLET BY MOUTH DAILY. 11/18/16   Leone Haven, MD  HYDROcodone-acetaminophen (NORCO) 5-325 MG tablet Take 1-2 tablets by mouth every 6 (six) hours as needed for moderate pain or severe pain. 11/26/16   Fanny Skates, MD  loperamide (IMODIUM) 2 MG capsule Take 2 mg by mouth daily.     [provider]  metoprolol succinate (TOPROL-XL) 25 MG 24 hr tablet Take 1 tablet (25 mg total) by mouth daily. 04/08/16   Leone Haven, MD  mupirocin ointment (BACTROBAN) 2 % Apply 1 application topically daily. "cyst in panty line"    [provider]  sertraline (ZOLOFT) 100 MG tablet Take 100 mg by mouth daily.     [provider]    ALLERGIES:  No Known Allergies  SOCIAL HISTORY:  Social History  Tobacco Use  . Smoking status: Current Every Day Smoker    Packs/day: 1.00    Years: 31.00    Pack years: 31.00    Types: Cigarettes  . Smokeless tobacco: Never Used  Substance Use Topics  . Alcohol use: Yes    Alcohol/week: 1.2 oz    Types: 2 Cans of beer per week    FAMILY HISTORY: Family History  Problem Relation Age of Onset  . Hypertension Mother   . Cancer Maternal Grandmother        Breast  . Cancer Sister        cervical    EXAM: BP 110/79 (BP Location: Right Arm)   Pulse 65   Temp 98.9 F (37.2 C) (Oral)   Resp 16   Ht 5\' 7"  (1.702 m)   Wt 90.7 kg (200 lb)   SpO2 98%   BMI 31.32 kg/m  CONSTITUTIONAL: Alert and oriented and responds appropriately to questions.  Well-appearing; well-nourished; GCS 15 HEAD: Normocephalic; atraumatic EYES: Conjunctivae clear, PERRL, EOMI ENT: normal nose; no rhinorrhea; moist mucous membranes; pharynx without lesions noted; no dental injury; no septal hematoma NECK: Supple, no meningismus, no LAD; no midline spinal tenderness, step-off or deformity; trachea midline CARD: RRR; S1 and S2 appreciated; no murmurs, no clicks, no rubs, no gallops RESP: Normal chest excursion without splinting or tachypnea; breath sounds clear and equal bilaterally; no wheezes, no rhonchi, no rales; no hypoxia or respiratory distress CHEST:  chest wall stable, no crepitus or ecchymosis or deformity, patient is extremely tender to palpation over the anterior lower left and right chest wall; no flail chest ABD/GI: Normal bowel sounds; non-distended; soft, tender to palpation throughout the upper abdomen, no rebound, no guarding; no ecchymosis or other lesions noted PELVIS:  stable, nontender to palpation BACK:  The back appears normal and is non-tender to palpation, there is no CVA tenderness; no midline spinal tenderness, step-off or deformity EXT: Normal ROM in all joints; non-tender to palpation; no edema; normal capillary refill; no cyanosis, no bony tenderness or bony deformity of patient's extremities, no joint effusion, compartments are soft, extremities are warm and well-perfused, no ecchymosis SKIN: Normal color for age and race; warm NEURO: Moves all extremities equally PSYCH: The patient's mood and manner are appropriate. Grooming and personal hygiene are appropriate.  MEDICAL DECISION MAKING: Patient here with mechanical fall.  Having lower chest and upper abdominal pain.  Will obtain CT imaging to rule out any liver or splenic injury.  We will also obtain CT of the chest for further evaluation.  Recently had a negative chest x-ray.  Will give pain medication, nausea medicine.  ED PROGRESS: Patient's labs are unremarkable.  CT scans  pending.  Signed out to Dr. Rex Kras to follow-up on patient's imaging.  If unremarkable, anticipate discharge home with prescription for pain medication and incentive spirometer.   I reviewed all nursing notes, vitals, pertinent previous records, EKGs, lab and urine results, imaging (as available).      Ward, Delice Bison, DO 05/10/17 [phone]

## 2017-05-10 NOTE — ED Triage Notes (Signed)
EDP into room at time of triage. Reports fall against counter Tuesday 8pm (4d ago). C/o L&R lower rib/ diaphragm pain. Hurts to breathe and move. Guarding resps. States, "can't cough". Seen by PCP for same, given shot which helped and had a CXR. Some dizziness. Denies head neck or back pain.    Alert, NAD, calm, interactive, resps e/u, guarded and shallow, speaking in clear complete sentences, no dyspnea noted, skin W&D, VSS. Family at [facility].

## 2017-05-10 NOTE — ED Notes (Signed)
Alert, NAD, emotional, crying, no dyspnea, shallow guarded resps, VSS. Husband at [facility]. Pending lab results and CT.

## 2017-05-10 NOTE — ED Notes (Signed)
Assumed care of patient from [place], [place]. Pt resting quietly. No distress. Awaiting CT.

## 2017-05-17 ENCOUNTER — Other Ambulatory Visit: Payer: Self-pay

## 2017-05-17 ENCOUNTER — Emergency Department (HOSPITAL_BASED_OUTPATIENT_CLINIC_OR_DEPARTMENT_OTHER)
Admission: EM | Admit: 2017-05-17 | Discharge: 2017-05-17 | Disposition: A | Discharge status: Home / Self Care | Payer: BLUE CROSS/BLUE SHIELD | Attending: Emergency Medicine | Admitting: Emergency Medicine

## 2017-05-17 ENCOUNTER — Encounter (HOSPITAL_BASED_OUTPATIENT_CLINIC_OR_DEPARTMENT_OTHER): Payer: Self-pay | Admitting: Emergency Medicine

## 2017-05-17 ENCOUNTER — Emergency Department (HOSPITAL_BASED_OUTPATIENT_CLINIC_OR_DEPARTMENT_OTHER): Payer: BLUE CROSS/BLUE SHIELD

## 2017-05-17 DIAGNOSIS — R1013 Epigastric pain: Secondary | ICD-10-CM | POA: Insufficient documentation

## 2017-05-17 DIAGNOSIS — F17210 Nicotine dependence, cigarettes, uncomplicated: Secondary | ICD-10-CM | POA: Insufficient documentation

## 2017-05-17 DIAGNOSIS — R072 Precordial pain: Secondary | ICD-10-CM | POA: Diagnosis not present

## 2017-05-17 DIAGNOSIS — I10 Essential (primary) hypertension: Secondary | ICD-10-CM | POA: Diagnosis not present

## 2017-05-17 DIAGNOSIS — R079 Chest pain, unspecified: Secondary | ICD-10-CM | POA: Diagnosis present

## 2017-05-17 LAB — CBC WITH DIFFERENTIAL/PLATELET
BASOS PCT: 0 %
Basophils Absolute: 0 10*3/uL (ref 0.0–0.1)
Eosinophils Absolute: 0.1 10*3/uL (ref 0.0–0.7)
Eosinophils Relative: 2 %
HCT: 38.5 % (ref 36.0–46.0)
HEMOGLOBIN: 13 g/dL (ref 12.0–15.0)
Lymphocytes Relative: 30 %
Lymphs Abs: 2.7 10*3/uL (ref 0.7–4.0)
MCH: 30.7 pg (ref 26.0–34.0)
MCHC: 33.8 g/dL (ref 30.0–36.0)
MCV: 91 fL (ref 78.0–100.0)
MONOS PCT: 7 %
Monocytes Absolute: 0.6 10*3/uL (ref 0.1–1.0)
Neutro Abs: 5.4 10*3/uL (ref 1.7–7.7)
Neutrophils Relative %: 61 %
Platelets: 273 10*3/uL (ref 150–400)
RBC: 4.23 MIL/uL (ref 3.87–5.11)
RDW: 14.4 % (ref 11.5–15.5)
WBC: 8.9 10*3/uL (ref 4.0–10.5)

## 2017-05-17 LAB — URINALYSIS, ROUTINE W REFLEX MICROSCOPIC
BILIRUBIN URINE: NEGATIVE
Glucose, UA: NEGATIVE mg/dL
Hgb urine dipstick: NEGATIVE
Ketones, ur: NEGATIVE mg/dL
LEUKOCYTES UA: NEGATIVE
NITRITE: NEGATIVE
PH: 6 (ref 5.0–8.0)
Protein, ur: NEGATIVE mg/dL
SPECIFIC GRAVITY, URINE: 1.02 (ref 1.005–1.030)

## 2017-05-17 LAB — COMPREHENSIVE METABOLIC PANEL
ALK PHOS: 79 U/L (ref 38–126)
ALT: 19 U/L (ref 14–54)
ANION GAP: 11 (ref 5–15)
AST: 19 U/L (ref 15–41)
Albumin: 4.4 g/dL (ref 3.5–5.0)
BUN: 13 mg/dL (ref 6–20)
CALCIUM: 9.9 mg/dL (ref 8.9–10.3)
CHLORIDE: 107 mmol/L (ref 101–111)
CO2: 24 mmol/L (ref 22–32)
Creatinine, Ser: 0.81 mg/dL (ref 0.44–1.00)
GFR calc non Af Amer: >60 mL/min (ref >60)
Glucose, Bld: 75 mg/dL (ref 65–99)
Potassium: 3.1 mmol/L — ABNORMAL LOW (ref 3.5–5.1)
SODIUM: 142 mmol/L (ref 135–145)
Total Bilirubin: 0.4 mg/dL (ref 0.3–1.2)
Total Protein: 7.6 g/dL (ref 6.5–8.1)

## 2017-05-17 LAB — TROPONIN I: Troponin I: <0.03 ng/mL (ref <0.03)

## 2017-05-17 MED ORDER — METHOCARBAMOL 500 MG PO TABS: 500.0000 mg | ORAL_TABLET | Freq: Two times a day (BID) | ORAL | 0 refills | Status: DC

## 2017-05-17 MED ORDER — ONDANSETRON HCL 4 MG/2ML IJ SOLN
4.0000 mg | Freq: Once | INTRAMUSCULAR | Status: AC
  Administered 2017-05-17: 4 mg via INTRAVENOUS
  Filled 2017-05-17: qty 2

## 2017-05-17 MED ORDER — SODIUM CHLORIDE 0.9 % IV BOLUS (SEPSIS)
500.0000 mL | Freq: Once | INTRAVENOUS | Status: AC
  Administered 2017-05-17: 500 mL via INTRAVENOUS

## 2017-05-17 MED ORDER — IBUPROFEN 800 MG PO TABS: 800.0000 mg | ORAL_TABLET | Freq: Three times a day (TID) | ORAL | 0 refills | Status: DC | PRN

## 2017-05-17 MED ORDER — MORPHINE SULFATE (PF) 4 MG/ML IV SOLN
4.0000 mg | Freq: Once | INTRAVENOUS | Status: AC
  Administered 2017-05-17: 4 mg via INTRAVENOUS
  Filled 2017-05-17: qty 1

## 2017-05-17 NOTE — ED Provider Notes (Signed)
Emergency Department Provider Note   I have reviewed the triage vital signs and the nursing notes.   HISTORY  Chief Complaint recheck fall injuries   HPI Melissa Shepherd is a 47 y.o. female presents to the emergency department for evaluation of continued chest and abdomen pain after mechanical fall last week.  The patient fell onto her countertop striking her anterior chest and epigastric region.  She presented to the emergency department 3 days after injury where a CT scan of the chest and abdomen were performed showing no traumatic injury at that time.  She was discharged home with Vicodin which she has been taking along with Motrin.  She states that her pain is worse.  She denies new injury.  She is having some difficulty breathing but denies fevers or productive cough.  Pain is worse with movement.  She states she has had significant decrease mobility since the injuries.  She also states she is gained weight unintentionally since the fall and injury.    Past Medical History:  Diagnosis Date  . Anginal pain ([place])   . Anxiety    panic attacks  . Chicken pox   . Cholelithiasis with acute on chronic cholecystitis without biliary obstruction 11/26/2016  . Depression   . Diarrhea   . Dyspnea    with chest pain  . Fatty liver   . Frequent headaches   . Hypertension   . IBS (irritable bowel syndrome)   . Multiple thyroid nodules   . 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 Cholelithiasis with acute on chronic cholecystitis without biliary obstruction 11/26/2016  . Anxiety 08/18/2015  . Hyperlipidemia 08/18/2015  . Vitamin D deficiency 08/18/2015  . IBS (irritable bowel syndrome) 05/01/2015  . Lumbago 10/06/2014  . Bilateral thoracic back pain 10/06/2014  . Chest pain 07/09/2014  . Leg swelling 07/09/2014  . Essential hypertension 06/11/2014  . Depression 06/11/2014  . Tobacco use disorder 06/11/2014    Past  Surgical History:  Procedure Laterality Date  . CESAREAN SECTION  1991. 2003.   2  . CHOLECYSTECTOMY N/A 11/26/2016   Procedure: LAPAROSCOPIC CHOLECYSTECTOMY WITH INTRAOPERATIVE CHOLANGIOGRAM;  Surgeon: Fanny Skates, MD;  Location: [facility];  Service: General;  Laterality: N/A;  . WISDOM TOOTH EXTRACTION      Current Outpatient Rx  . Order #: [id number] Class: Historical Med  . Order #: [id number] Class: Historical Med  . Order #: [id number] Class: Historical Med  . Order #: [id number] Class: Historical Med  . Order #: [id number] Class: Normal  . Order #: [id number] Class: Historical Med  . Order #: [id number] Class: Normal  . Order #: [id number] Class: Print  . Order #: [id number] Class: Print  . Order #: [id number] Class: Historical Med  . Order #: [id number] Class: Print  . Order #: [id number] Class: Normal  . Order #: [id number] Class: Historical Med  . Order #: [id number] Class: Historical Med    Allergies Prednisone  Family History  Problem Relation Age of Onset  . Hypertension Mother   . Cancer Maternal Grandmother        Breast  . Cancer Sister        cervical    Social History Social History   Tobacco Use  . Smoking status: Current Every Day Smoker    Packs/day: 1.00    Years: 31.00    Pack years: 31.00    Types: Cigarettes  . Smokeless tobacco: Never Used  Substance Use Topics  . Alcohol use: Yes  Alcohol/week: 1.2 oz    Types: 2 Cans of beer per week  . Drug use: No    Review of Systems  Constitutional: No fever/chills Eyes: No visual changes. ENT: No sore throat. Cardiovascular: Positive chest pain. Respiratory: Positive shortness of breath. Gastrointestinal: Positive abdominal pain.  No nausea, no vomiting.  No diarrhea.  No constipation. Genitourinary: Negative for dysuria. Musculoskeletal: Negative for back pain. Skin: Negative for rash. Neurological: Negative for headaches, focal weakness or numbness.  10-point ROS otherwise  negative.  ____________________________________________   PHYSICAL EXAM:  VITAL SIGNS: ED Triage Vitals  Enc Vitals Group     BP 05/17/17 1824 130/85     Pulse Rate 05/17/17 1824 (!) 54     Resp 05/17/17 1824 18     Temp 05/17/17 1824 98.4 F (36.9 C)     Temp Source 05/17/17 1824 Oral     SpO2 05/17/17 1824 100 %     Pain Score 05/17/17 1830 9   Constitutional: Alert and oriented. Tearful at times during the exam.  Eyes: Conjunctivae are normal. Head: Atraumatic. Nose: No congestion/rhinnorhea. Mouth/Throat: Mucous membranes are moist.  Neck: No stridor.   Cardiovascular: Normal rate, regular rhythm. Good peripheral circulation. Grossly normal heart sounds.   Respiratory: Normal respiratory effort.  No retractions. Lungs CTAB. Gastrointestinal: Soft with mild diffuse tenderness. No distention.  Musculoskeletal: No lower extremity tenderness nor edema. No gross deformities of extremities. Tenderness over the anterior chest wall without bruising.  Neurologic:  Normal speech and language. No gross focal neurologic deficits are appreciated.  Skin:  Skin is warm, dry and intact. No rash noted.  ____________________________________________   LABS (all labs ordered are listed, but only abnormal results are displayed)  Labs Reviewed  COMPREHENSIVE METABOLIC PANEL - Abnormal; Notable for the following components:      Result Value   Potassium 3.1 (*)    All other components within normal limits  CBC WITH DIFFERENTIAL/PLATELET  URINALYSIS, ROUTINE W REFLEX MICROSCOPIC  TROPONIN I   ____________________________________________  EKG   EKG Interpretation  Date/Time:  Saturday May 17 2017 19:38:40 EDT Ventricular Rate:  46 PR Interval:    QRS Duration: 90 QT Interval:  414 QTC Calculation: 362 R Axis:   79 Text Interpretation:  Sinus bradycardia No STEMI.  Confirmed by Nanda Quinton [phone]) on 05/17/2017 8:29:23 PM Also confirmed by Nanda Quinton [phone]), editor Philomena Doheny [phone])  on 05/18/2017 8:27:33 AM       ____________________________________________  RADIOLOGY  Dg Abdomen Acute W/chest  Result Date: 05/17/2017 CLINICAL DATA:  Persistent chest and back pain following fall 1 week ago, initial encounter EXAM: DG ABDOMEN ACUTE W/ 1V CHEST COMPARISON:  05/10/2017 CT of the chest abdomen and pelvis FINDINGS: Cardiac shadow is within normal limits. Mild left basilar atelectatic changes are noted similar to that seen on the prior exam. No new infiltrate is seen. Scattered large and small bowel gas is noted. Mild retained fecal material is seen. Changes of prior cholecystectomy are noted. No acute bony abnormality is seen. No free air is noted. IMPRESSION: Stable left basilar atelectatic changes. No acute abnormality is noted. Electronically Signed   By: Inez Catalina M.D.   On: 05/17/2017 19:26    ____________________________________________   PROCEDURES  Procedure(s) performed:   Procedures  None ____________________________________________   INITIAL IMPRESSION / ASSESSMENT AND PLAN / ED COURSE  Pertinent labs & imaging results that were available during my care of the patient were reviewed by me and considered in my  medical decision making (see chart for details).  Patient presents to the emergency department for reevaluation of chest and epigastric abdominal pain after fall and injury over a week ago.  She had CT scans of the chest, abdomen, pelvis 3-4 days after her initial injury that showed no traumatic findings.  Patient is tearful and seems uncomfortable with me.  Plan for screening x-ray to rule out pulmonary contusion or pneumonia.  Plan for baseline labs and urinalysis.  To make sure she has no hematuria or dramatic decrease in her Hb count.   Labs and imaging reviewed. More comfortable after pain meds in the ED. No new findings of plain films. No indication at this time for repeat CT imaging. Will discuss PT with her PCP. Discharging  home with msk relaxer but advised early/often ambulation and movement.   At this time, I do not feel there is any life-threatening condition present. I have reviewed and discussed all results (EKG, imaging, lab, urine as appropriate), exam findings with patient. I have reviewed nursing notes and appropriate previous records.  I feel the patient is safe to be discharged home without further emergent workup. Discussed usual and customary return precautions. Patient and family (if present) verbalize understanding and are comfortable with this plan.  Patient will follow-up with their primary care provider. If they do not have a primary care provider, information for follow-up has been provided to them. All questions have been answered.  ____________________________________________  FINAL CLINICAL IMPRESSION(S) / ED DIAGNOSES  Final diagnoses:  Precordial chest pain     MEDICATIONS GIVEN DURING THIS VISIT:  Medications  morphine 4 MG/ML injection 4 mg (4 mg Intravenous Given 05/17/17 1951)  sodium chloride 0.9 % bolus 500 mL (0 mLs Intravenous Stopped 05/17/17 2103)  ondansetron (ZOFRAN) injection 4 mg (4 mg Intravenous Given 05/17/17 1948)     NEW OUTPATIENT MEDICATIONS STARTED DURING THIS VISIT:  Discharge Medication List as of 05/17/2017  8:58 PM    START taking these medications   Details  methocarbamol (ROBAXIN) 500 MG tablet Take 1 tablet (500 mg total) by mouth 2 (two) times daily., Starting Sat 05/17/2017, Print        Note:  This document was prepared using Dragon voice recognition software and may include unintentional dictation errors.  Nanda Quinton, MD Emergency Medicine    Telesa Jeancharles, [facility], MD 05/18/17 [phone]

## 2017-05-17 NOTE — ED Notes (Signed)
Patient transported to X-ray, will obtain labs/ekg and give meds on return to room.

## 2017-05-17 NOTE — ED Notes (Signed)
ED Provider at bedside. 

## 2017-05-17 NOTE — Discharge Instructions (Signed)

## 2017-05-17 NOTE — ED Triage Notes (Signed)
Pt fell against counter top last week and seen here with diagnosis of chest contusion. Pt reports pain is getting not better, dizziness, difficulty breathing. States her PCP has not called her back.

## 2017-05-17 NOTE — ED Notes (Signed)
Pt given d/c instructions as per chart. Rx x 2 with precautions. Verbalizes understanding. No questions. 

## 2017-08-20 ENCOUNTER — Other Ambulatory Visit
Admission: RE | Admit: 2017-08-20 | Discharge: 2017-08-20 | Disposition: A | Discharge status: Home / Self Care | Payer: BLUE CROSS/BLUE SHIELD | Source: Ambulatory Visit | Attending: Gastroenterology | Admitting: Gastroenterology

## 2017-08-20 DIAGNOSIS — R197 Diarrhea, unspecified: Secondary | ICD-10-CM | POA: Insufficient documentation

## 2017-08-20 LAB — GASTROINTESTINAL PANEL BY PCR, STOOL (REPLACES STOOL CULTURE)
ASTROVIRUS: NOT DETECTED
Adenovirus F40/41: NOT DETECTED
CAMPYLOBACTER SPECIES: NOT DETECTED
CYCLOSPORA CAYETANENSIS: NOT DETECTED
Cryptosporidium: NOT DETECTED
ENTAMOEBA HISTOLYTICA: NOT DETECTED
Enteroaggregative E coli (EAEC): NOT DETECTED
Enteropathogenic E coli (EPEC): NOT DETECTED
Enterotoxigenic E coli (ETEC): NOT DETECTED
Giardia lamblia: NOT DETECTED
Norovirus GI/GII: NOT DETECTED
PLESIMONAS SHIGELLOIDES: NOT DETECTED
Rotavirus A: NOT DETECTED
SALMONELLA SPECIES: NOT DETECTED
SAPOVIRUS (I, II, IV, AND V): NOT DETECTED
SHIGA LIKE TOXIN PRODUCING E COLI (STEC): NOT DETECTED
SHIGELLA/ENTEROINVASIVE E COLI (EIEC): NOT DETECTED
VIBRIO SPECIES: NOT DETECTED
Vibrio cholerae: NOT DETECTED
Yersinia enterocolitica: NOT DETECTED

## 2017-08-20 LAB — C DIFFICILE QUICK SCREEN W PCR REFLEX
C DIFFICILE (CDIFF) INTERP: NOT DETECTED
C Diff antigen: NEGATIVE
C Diff toxin: NEGATIVE

## 2018-01-13 DIAGNOSIS — D259 Leiomyoma of uterus, unspecified: Secondary | ICD-10-CM | POA: Insufficient documentation

## 2018-05-28 ENCOUNTER — Ambulatory Visit: Payer: BLUE CROSS/BLUE SHIELD | Admitting: Physical Therapy

## 2018-06-04 ENCOUNTER — Encounter: Payer: BLUE CROSS/BLUE SHIELD | Admitting: Physical Therapy

## 2018-06-11 ENCOUNTER — Encounter: Payer: BLUE CROSS/BLUE SHIELD | Admitting: Physical Therapy

## 2018-06-18 ENCOUNTER — Encounter: Payer: BLUE CROSS/BLUE SHIELD | Admitting: Physical Therapy

## 2018-06-25 ENCOUNTER — Encounter: Payer: BLUE CROSS/BLUE SHIELD | Admitting: Physical Therapy

## 2018-07-02 ENCOUNTER — Encounter: Payer: BLUE CROSS/BLUE SHIELD | Admitting: Physical Therapy

## 2018-07-09 ENCOUNTER — Encounter: Payer: BLUE CROSS/BLUE SHIELD | Admitting: Physical Therapy

## 2018-07-15 ENCOUNTER — Ambulatory Visit: Payer: BLUE CROSS/BLUE SHIELD | Attending: Gastroenterology | Admitting: Physical Therapy

## 2018-07-16 ENCOUNTER — Encounter: Payer: BLUE CROSS/BLUE SHIELD | Admitting: Physical Therapy

## 2018-07-22 ENCOUNTER — Encounter: Payer: BLUE CROSS/BLUE SHIELD | Admitting: Physical Therapy

## 2018-07-23 ENCOUNTER — Encounter: Payer: BLUE CROSS/BLUE SHIELD | Admitting: Physical Therapy

## 2018-07-28 ENCOUNTER — Encounter: Payer: BLUE CROSS/BLUE SHIELD | Admitting: Physical Therapy

## 2018-08-04 ENCOUNTER — Ambulatory Visit: Payer: BLUE CROSS/BLUE SHIELD | Admitting: Physical Therapy

## 2018-08-05 ENCOUNTER — Ambulatory Visit: Payer: BLUE CROSS/BLUE SHIELD | Admitting: Physical Therapy

## 2018-08-06 ENCOUNTER — Ambulatory Visit: Payer: BLUE CROSS/BLUE SHIELD | Admitting: Physical Therapy

## 2018-08-07 ENCOUNTER — Ambulatory Visit: Payer: BLUE CROSS/BLUE SHIELD | Admitting: Physical Therapy

## 2018-08-11 ENCOUNTER — Ambulatory Visit: Payer: BLUE CROSS/BLUE SHIELD | Admitting: Physical Therapy

## 2018-08-11 ENCOUNTER — Other Ambulatory Visit: Payer: Self-pay | Admitting: Unknown Physician Specialty

## 2018-08-11 DIAGNOSIS — E041 Nontoxic single thyroid nodule: Secondary | ICD-10-CM

## 2018-08-18 ENCOUNTER — Ambulatory Visit: Payer: BLUE CROSS/BLUE SHIELD | Admitting: Physical Therapy

## 2018-08-25 ENCOUNTER — Ambulatory Visit: Payer: BLUE CROSS/BLUE SHIELD | Admitting: Physical Therapy

## 2018-08-26 ENCOUNTER — Inpatient Hospital Stay: Admission: RE | Admit: 2018-08-26 | Payer: BLUE CROSS/BLUE SHIELD | Source: Ambulatory Visit

## 2018-09-01 ENCOUNTER — Encounter: Payer: BLUE CROSS/BLUE SHIELD | Admitting: Physical Therapy

## 2018-09-02 ENCOUNTER — Ambulatory Visit
Admission: RE | Admit: 2018-09-02 | Discharge: 2018-09-02 | Disposition: A | Discharge status: Home / Self Care | Payer: BC Managed Care – PPO | Source: Ambulatory Visit | Attending: Unknown Physician Specialty | Admitting: Unknown Physician Specialty

## 2018-09-02 ENCOUNTER — Other Ambulatory Visit: Payer: Self-pay

## 2018-09-02 DIAGNOSIS — E041 Nontoxic single thyroid nodule: Secondary | ICD-10-CM | POA: Diagnosis not present

## 2018-09-08 ENCOUNTER — Encounter: Payer: BLUE CROSS/BLUE SHIELD | Admitting: Physical Therapy

## 2019-01-06 ENCOUNTER — Ambulatory Visit: Payer: BC Managed Care – PPO | Admitting: Dietician

## 2019-02-04 ENCOUNTER — Encounter: Payer: Self-pay | Admitting: Dietician

## 2019-02-04 NOTE — Progress Notes (Signed)
Have not heard back from patient to reschedule her cancelled appointment. Sent notification to referring provider.

## 2019-12-24 DIAGNOSIS — C50111 Malignant neoplasm of central portion of right female breast: Secondary | ICD-10-CM | POA: Insufficient documentation

## 2020-01-07 DIAGNOSIS — Z87898 Personal history of other specified conditions: Secondary | ICD-10-CM | POA: Insufficient documentation

## 2020-01-18 ENCOUNTER — Ambulatory Visit: Payer: BC Managed Care – PPO | Admitting: Neurology

## 2020-01-18 ENCOUNTER — Encounter: Payer: Self-pay | Admitting: Neurology

## 2020-01-18 VITALS — BP 112/79 | HR 68 | Ht 67.0 in | Wt 236.0 lb

## 2020-01-18 DIAGNOSIS — G4489 Other headache syndrome: Secondary | ICD-10-CM

## 2020-01-18 DIAGNOSIS — H4710 Unspecified papilledema: Secondary | ICD-10-CM | POA: Diagnosis not present

## 2020-01-18 DIAGNOSIS — R519 Headache, unspecified: Secondary | ICD-10-CM

## 2020-01-18 DIAGNOSIS — R0683 Snoring: Secondary | ICD-10-CM | POA: Diagnosis not present

## 2020-01-18 DIAGNOSIS — G4719 Other hypersomnia: Secondary | ICD-10-CM

## 2020-01-18 DIAGNOSIS — F439 Reaction to severe stress, unspecified: Secondary | ICD-10-CM

## 2020-01-18 DIAGNOSIS — R0681 Apnea, not elsewhere classified: Secondary | ICD-10-CM

## 2020-01-18 NOTE — Progress Notes (Signed)
Subjective:    Patient ID: Melissa Shepherd is a 49 y.o. female.  HPI     Star Age, MD, PhD [facility] [address], [address] [place], [place] [address]  Dear Dr. Marin Comment,  I saw your patient, Melissa Shepherd, upon your kind request in my neurologic clinic today for initial consultation of her headaches and blurred disc margins noted in both eyes.  The patient is accompanied by her husband today.  As you know, 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  She had not seen an eye doctor in over 2 years and needed new prescriptions.  She started having prescription eyeglasses about 25 years ago.  She did not go for a regular eye examination and has had an eye exam a total of 3 times in her lifetime she says.  She has been using readers.  She denies any blurry vision, double vision, or loss of vision.  She denies any sudden onset of one-sided weakness or numbness or tingling or droopy face or slurring of speech.  She had involuntary mouth movements and pulling sensation around the left side of her lower lip when she was on Latuda.  She has been off of it for the past month.  She is followed by psychiatry and was started on a new medication recently.  She reports significant increase in stress in the recent past what with her upcoming breast cancer surgery, she also worries about her 81 year old son who lives with them and needs support as he has mental health issues.  She also worries about her 62 year old daughter.  Her 89 year old daughter's father stayed with them recently because of issues in his life recently.    I  reviewed your office note from 01/15/2020 extraocular movements were full and unrestricted, pupils were equal, round and reactive to light without afferent pupillary defect, confrontation fields were full, slit-lamp exam was unremarkable.  Intraocular pressures were 19 in each eye, dilated fundus examination revealed blurred disc margins in each eye.  Lenses revealed mild cataracts in both eyes.    She reports snoring and excessive daytime somnolence.  Her Epworth sleepiness score is 11 out of 24, fatigue severity score is 31 out of 63.  She snores and has breathing pauses while asleep.  Her husband has urged her to get a sleep study but she has never had a sleep study.  He himself has sleep apnea and uses a CPAP machine.  She has recurrent morning headaches.  She has had infrequent migraines. She smokes about a pack to a pack and 1/2/day, she is trying to quit smoking, she has reduced her caffeine intake.  She lives with her husband, her 22 year old son lives with them but she is trying to find him a place to live, 83 year old daughter lives with them as well.  She has seen different neurologists in the past, mostly in 2015 for different problems including paresthesias and recurrent headaches.  She had a brain MRI without contrast on 06/21/2013 and I reviewed the results, IMPRESSION: 1.  Unremarkable MR brain. 2.  Interval enlargement of a circumscribed lesion within the  subcutaneous fat superficial to the anterior right parotid gland  measuring approximately  1.5 cm. No adjacent inflammatory changes or  aggressive features. Question inclusion cyst. Please correlate  clinically.      She had a brain MRI without contrast on 12/27/2010 and I reviewed the results:  IMPRESSION: Essentially normal MRI of the brain.   Indication was headaches and dizziness at the time.  There was also stated history of a brainstem lesion but on the MRI no brainstem abnormalities were visualized.  Possible venous angioma in  the deep left cerebral white matter was deemed a benign finding.  Her Past Medical History Is Significant For: Past Medical History:  Diagnosis Date  . Anginal pain ([place])   . Anxiety    panic attacks  . Chicken pox   . Cholelithiasis with acute on chronic cholecystitis without biliary obstruction 11/26/2016  . Depression   . Diarrhea   . Dyspnea    with chest pain  . Fatty liver   . Frequent headaches   . Hypertension   . IBS (irritable bowel syndrome)   . Multiple thyroid nodules   . Seizures ([place])    pregenancy- toxemia- 1991    Her Past Surgical History Is Significant For: Past Surgical History:  Procedure Laterality Date  . CESAREAN SECTION  1991. 2003.   2  . CHOLECYSTECTOMY N/A 11/26/2016   Procedure: LAPAROSCOPIC CHOLECYSTECTOMY WITH INTRAOPERATIVE CHOLANGIOGRAM;  Surgeon: Fanny Skates, MD;  Location: [facility];  Service: General;  Laterality: N/A;  . WISDOM TOOTH EXTRACTION      Her Family History Is Significant For: Family History  Problem Relation Age of Onset  . Hypertension Mother   . Cancer Maternal Grandmother        Breast  . Cancer Sister        cervical    Her Social History Is Significant For: Social History   Socioeconomic History  . Marital status: Married    Spouse name: Not on file  . Number of children: Not on file  . Years of education: Not on file  . Highest education level: Not on file  Occupational History  . Not on file  Tobacco Use  . Smoking status: Current Every Day Smoker    Packs/day: 1.00    Years: 31.00    Pack years: 31.00    Types: Cigarettes  . Smokeless tobacco: Never Used  Vaping Use  . Vaping Use: Never used  Substance and Sexual Activity  . Alcohol use: Yes    Alcohol/week: 2.0 standard drinks    Types: 2 Cans of beer per week  . Drug use: No  . Sexual activity: Not Currently  Other Topics Concern  . Not on file  Social History Narrative   Hair dresser    Lives with husband and 15 yo daughter   High  school degree and cosmetology school    Right handed    Caffeine- 1 cup of coffee, 1 soda    Pets: 1 dog inside    Social Determinants of Health   Financial Resource Strain:   . Difficulty of Paying Living Expenses: Not on file  Food Insecurity:   . Worried About Charity fundraiser in the Last Year: Not on file  . Ran Out of Food in the Last Year: Not on file  Transportation Needs:   . Lack of Transportation (Medical): Not on file  . Lack of Transportation (Non-Medical): Not on file  Physical Activity:   . Days of Exercise per Week: Not on file  . Minutes of Exercise per Session: Not  on file  Stress:   . Feeling of Stress : Not on file  Social Connections:   . Frequency of Communication with Friends and Family: Not on file  . Frequency of Social Gatherings with Friends and Family: Not on file  . Attends Religious Services: Not on file  . Active Member of Clubs or Organizations: Not on file  . Attends Archivist Meetings: Not on file  . Marital Status: Not on file    Her Allergies Are:  Allergies  Allergen Reactions  . Ezetimibe Other (See Comments)    Patient does not report what actually happens with Zetia Patient does not report what actually happens with Zetia   . Prednisone   :   Her Current Medications Are:  Outpatient Encounter Medications as of 01/18/2020  Medication Sig  . ALPRAZolam (XANAX) 1 MG tablet Take 0.5-1 mg by mouth at bedtime as needed for anxiety.  . Cholecalciferol (VITAMIN D-3) 5000 units TABS Take 1 tablet by mouth daily.  Marland Kitchen gemfibrozil (LOPID) 600 MG tablet Take 600 mg by mouth 2 (two) times daily before a meal.  . hydrochlorothiazide (HYDRODIURIL) 12.5 MG tablet TAKE 1 TABLET BY MOUTH DAILY.  Marland Kitchen ibuprofen (ADVIL,MOTRIN) 800 MG tablet Take 1 tablet (800 mg total) by mouth every 8 (eight) hours as needed.  . metoprolol succinate (TOPROL-XL) 25 MG 24 hr tablet Take 1 tablet (25 mg total) by mouth daily. (Patient taking differently: Take  50 mg by mouth daily. )  . metoprolol tartrate (LOPRESSOR) 50 MG tablet Take 50 mg by mouth 2 (two) times daily.  . mupirocin ointment (BACTROBAN) 2 % Apply 1 application topically daily. "cyst in panty line"  . sertraline (ZOLOFT) 100 MG tablet Take 100 mg by mouth daily.   . furosemide (LASIX) 20 MG tablet Take 1 tablet (20 mg total) by mouth daily.  . [DISCONTINUED] ALPRAZolam (XANAX XR) 0.5 MG 24 hr tablet Take 0.5-1 mg by mouth 2 (two) times daily as needed for anxiety.  . [DISCONTINUED] HYDROcodone-acetaminophen (NORCO/VICODIN) 5-325 MG tablet Take 1 tablet by mouth every 4 (four) hours as needed for severe pain.  . [DISCONTINUED] loperamide (IMODIUM) 2 MG capsule Take 2 mg by mouth daily.   . [DISCONTINUED] methocarbamol (ROBAXIN) 500 MG tablet Take 1 tablet (500 mg total) by mouth 2 (two) times daily.   No facility-administered encounter medications on file as of 01/18/2020.  :   Review of Systems:  Out of a complete 14 point review of systems, all are reviewed and negative with the exception of these symptoms as listed below:    Review of Systems  Neurological:       Pt presents today to discuss her headaches. Pt wakes up with a headache. Pt's eye doctor was concerned about her eye exam. Pt does endorse snoring.  Epworth Sleepiness Scale 0= would never doze 1= slight chance of dozing 2= moderate chance of dozing 3= high chance of dozing  Sitting and reading: 2 Watching TV: 3 Sitting inactive in a public place (ex. Theater or meeting): 2 As a passenger in a car for an hour without a break: 0 Lying down to rest in the afternoon: 3 Sitting and talking to someone: 0 Sitting quietly after lunch (no alcohol): 1 In a car, while stopped in traffic: 0 Total: 11     Objective:  Neurological Exam  Physical Exam Physical Examination:   Vitals:   01/18/20 1539  BP: 112/79  Pulse: 68   General Examination: The patient is  a very pleasant 49 y.o. female in no acute  distress. She appears well-developed and well-nourished and well groomed.  She is anxious appearing.  HEENT: Normocephalic, atraumatic, pupils are equal, round and reactive to light and accommodation. Funduscopic exam is without telltale papilledema on my examination.  Extraocular tracking is good without limitation to gaze excursion or nystagmus noted. Normal smooth pursuit is noted. Hearing is grossly intact. Face is symmetric with normal facial animation and normal facial sensation. Speech is clear with no dysarthria noted. There is no hypophonia. There is no lip, neck/head, jaw or voice tremor. Neck is supple with full range of passive and active motion. There are no carotid bruits on auscultation. Oropharynx exam reveals: mild mouth dryness, adequate dental hygiene and moderate airway crowding, due to small airway entry, tonsils are in place, on the smaller side, Mallampati class II.  Neck circumference is 15 and three-quarter inches.  Tongue protrudes centrally in palate elevates symmetrically.  No abnormal involuntary mouth or facial movements noted.  Chest: Clear to auscultation without wheezing, rhonchi or crackles noted.  Heart: S1+S2+0, regular and normal without murmurs, rubs or gallops noted.   Abdomen: Soft, non-tender and non-distended with normal bowel sounds appreciated on auscultation.  Extremities: There is no pitting edema in the distal lower extremities bilaterally. Pedal pulses are intact.  Skin: Warm and dry without trophic changes noted.  Musculoskeletal: exam reveals no obvious joint deformities, tenderness or joint swelling or erythema.   Neurologically:  Mental status: The patient is awake, alert and oriented in all 4 spheres. Her immediate and remote memory, attention, language skills and fund of knowledge are appropriate. There is no evidence of aphasia, agnosia, apraxia or anomia. Speech is clear with normal prosody and enunciation. Thought process is linear. Mood is  normal and affect is normal.  Cranial nerves II - XII are as described above under HEENT exam. In addition: shoulder shrug is normal with equal shoulder height noted. Motor exam: Normal bulk, strength and tone is noted. There is no drift, tremor or rebound. Romberg is negative. Reflexes are 2+ throughout. Babinski: Toes are flexor bilaterally. Fine motor skills and coordination: intact with normal finger taps, normal hand movements, normal rapid alternating patting, normal foot taps and normal foot agility.  Cerebellar testing: No dysmetria or intention tremor on finger to nose testing. Heel to shin is unremarkable bilaterally. There is no truncal or gait ataxia.  Sensory exam: intact to light touch in the upper and lower extremities.  Gait, station and balance: She stands easily. No veering to one side is noted. No leaning to one side is noted. Posture is age-appropriate and stance is narrow based. Gait shows normal stride length and normal pace. No problems turning are noted. Tandem walk is unremarkable.   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  She has had no obvious visual symptoms such as blurry vision, double vision, or loss of vision but does report that she felt she needed new eyeglasses.  She has not had an eye examination on a regular basis and her last prescription eyeglasses broke a year ago  or so.  She is in the process of getting ready for her breast cancer surgery.  She endorses significant stress, examination from the neurological standpoint  is benign but she does have a history of recurrent headaches, including morning headaches and witnessed apneas.  I suggested we proceed with additional work-up in the form of brain MRI and orbital MRI with and without contrast as mild as venogram of the head.  I talked to her about papilledema, and its causes including intracranial hypertension.  We talked about testing of the spinal fluid pressure with a lumbar puncture.  She would like to wait if possible.  I understand that she has a lot of other things going on right now.  We mutually agreed to pursue imaging testing, a sleep study, and a referral to an ophthalmologist.  She has an underlying complicated history and would benefit from establishing with an ophthalmologist as well.  She would like to pursue these tests and referrals for now.  If she has sleep apnea she is advised that she will be offered CPAP or AutoPap therapy.  She is advised that we would probably consider spinal fluid testing and a lumbar puncture with opening pressure in the near future.  For now, we will proceed with imaging tests and sleep evaluation as well as referral to an ophthalmologist.  She is in the process of getting her bifocals.  I plan to see her back soon after testing.  I answered all their questions today and the patient and her husband were in agreement with the plan.  This was an extended visit. Thank you very much for allowing me to participate in the care of this nice patient. If I can be of any further assistance to you please do not hesitate to call me at [phone].  Sincerely,   Star Age, MD, PhD

## 2020-01-18 NOTE — Patient Instructions (Signed)
You may have a condition called pseudotumor cerebri, which means that there may be increased fluid pressure around your brain and results in pressure on your eye nerve(s). As discussed, we will do more testing at this time. 1. We will do a brain MRI as well as orbital MRI and a MR venogram which looks at the draining system of your brain blood vessels. 2. We will get a formal eye exam with the help of an ophthalmologist.  I have made a referral in that regard. 3. We may request a spinal tap, also known as a lumbar puncture (LP) with pressure testing and routine fluid testing. We will consider this in the near future.    4. We may start a medication called diamox to help keep your spinal fluid pressure at [place].  We may consider this soon. 5. As you know, your vision and visual field can be affected. The most serious complication of having pseudotumor cerebri is loss of vision which can be permanent.  6. You may need to be considered for a shunt in the future (to prevent fluid pressure from building up). 7. We will do a sleep study to rule out obstructive sleep apnea.    For now, we will proceed with your scans and your referral to an ophthalmologist and pursue the sleep test.  We will keep you posted as to your test results by phone call.

## 2020-01-19 ENCOUNTER — Ambulatory Visit (INDEPENDENT_AMBULATORY_CARE_PROVIDER_SITE_OTHER): Payer: BC Managed Care – PPO

## 2020-01-19 ENCOUNTER — Telehealth: Payer: Self-pay | Admitting: Neurology

## 2020-01-19 DIAGNOSIS — H4710 Unspecified papilledema: Secondary | ICD-10-CM

## 2020-01-19 DIAGNOSIS — R0683 Snoring: Secondary | ICD-10-CM

## 2020-01-19 DIAGNOSIS — G4489 Other headache syndrome: Secondary | ICD-10-CM

## 2020-01-19 DIAGNOSIS — Z1371 Encounter for nonprocreative screening for genetic disease carrier status: Secondary | ICD-10-CM | POA: Insufficient documentation

## 2020-01-19 DIAGNOSIS — G4719 Other hypersomnia: Secondary | ICD-10-CM

## 2020-01-19 DIAGNOSIS — R519 Headache, unspecified: Secondary | ICD-10-CM | POA: Diagnosis not present

## 2020-01-19 DIAGNOSIS — F439 Reaction to severe stress, unspecified: Secondary | ICD-10-CM

## 2020-01-19 DIAGNOSIS — R0681 Apnea, not elsewhere classified: Secondary | ICD-10-CM

## 2020-01-19 MED ORDER — GADOBENATE DIMEGLUMINE 529 MG/ML IV SOLN
20.0000 mL | Freq: Once | INTRAVENOUS | Status: AC | PRN
  Administered 2020-01-19: 20 mL via INTRAVENOUS

## 2020-01-19 NOTE — Telephone Encounter (Signed)
LVM for pt to call back about scheduling mri [employer] auth: [id number] (exp. 01/19/20 to 07/16/20)

## 2020-01-19 NOTE — Telephone Encounter (Signed)
An FYI  Patient is scheduled at [facility] for this afternoon 01/19/20.

## 2020-01-24 ENCOUNTER — Telehealth: Payer: Self-pay

## 2020-01-24 NOTE — Telephone Encounter (Signed)
-----   Message from Star Age, MD sent at 01/24/2020  7:51 AM EST ----- Please call patient and advise her that her scans were normal, this includes her brain MRI with and without contrast her orbital/eyeball MRI with and without contrast and MR venogram which looked at the drainage system of her brain blood vessels.  Thankfully and reassuringly, all her scans were normal.

## 2020-01-24 NOTE — Telephone Encounter (Signed)
I called pt. I discussed her MRI/MRV results. I also gave her the phone number to Dr. Zenia Resides office. Pt verbalized understanding of results. Pt had no questions at this time but was encouraged to call back if questions arise.

## 2020-01-24 NOTE — Progress Notes (Signed)
Please call patient and advise her that her scans were normal, this includes her brain MRI with and without contrast her orbital/eyeball MRI with and without contrast and MR venogram which looked at the drainage system of her brain blood vessels.  Thankfully and reassuringly, all her scans were normal.

## 2020-02-27 ENCOUNTER — Encounter: Payer: Self-pay | Admitting: Emergency Medicine

## 2020-02-27 ENCOUNTER — Ambulatory Visit
Admission: EM | Admit: 2020-02-27 | Discharge: 2020-02-27 | Disposition: A | Discharge status: Home / Self Care | Payer: BC Managed Care – PPO

## 2020-02-27 ENCOUNTER — Other Ambulatory Visit: Payer: Self-pay

## 2020-02-27 DIAGNOSIS — J069 Acute upper respiratory infection, unspecified: Secondary | ICD-10-CM | POA: Diagnosis not present

## 2020-02-27 HISTORY — DX: Malignant (primary) neoplasm, unspecified: C80.1

## 2020-02-27 MED ORDER — CETIRIZINE HCL 10 MG PO TABS: 10.0000 mg | ORAL_TABLET | Freq: Every day | ORAL | 0 refills | Status: DC

## 2020-02-27 MED ORDER — BENZONATATE 100 MG PO CAPS: 100.0000 mg | ORAL_CAPSULE | Freq: Three times a day (TID) | ORAL | 0 refills | Status: DC

## 2020-02-27 MED ORDER — NYSTATIN 100000 UNIT/ML MT SUSP: 500000.0000 [IU] | Freq: Four times a day (QID) | OROMUCOSAL | 0 refills | Status: DC

## 2020-02-27 NOTE — ED Provider Notes (Signed)
[name]    CSN: [id number] Arrival date & time: 02/27/20  1216      History   Chief Complaint Chief Complaint  Patient presents with  . URI    HPI Melissa Shepherd is a 50 y.o. female  With extensive medical history as below presenting for fever, myalgias, white coating to tongue, nasal congestion ear popping.  States that this began a few days ago.  Has a dry cough as well as of today.  No difficulty breathing or chest pain.  Patient is s/p mastectomy in December 2021.  Followed weekly by her oncologist and surgeon.  Past Medical History:  Diagnosis Date  . Anginal pain (HCC)   . Anxiety    panic attacks  . Cancer (HCC)   . Chicken pox   . Cholelithiasis with acute on chronic cholecystitis without biliary obstruction 11/26/2016  . Depression   . Diarrhea   . Dyspnea    with chest pain  . Fatty liver   . Frequent headaches   . Hypertension   . IBS (irritable bowel syndrome)   . Multiple thyroid nodules   . Seizures (HCC)    pregenancy- toxemia- 1991    Patient Active Problem List   Diagnosis Date Noted  . Cholelithiasis with acute on chronic cholecystitis without biliary obstruction 11/26/2016  . Anxiety 08/18/2015  . Hyperlipidemia 08/18/2015  . Vitamin D deficiency 08/18/2015  . IBS (irritable bowel syndrome) 05/01/2015  . Lumbago 10/06/2014  . Bilateral thoracic back pain 10/06/2014  . Chest pain 07/09/2014  . Leg swelling 07/09/2014  . Essential hypertension 06/11/2014  . Depression 06/11/2014  . Tobacco use disorder 06/11/2014    Past Surgical History:  Procedure Laterality Date  . CESAREAN SECTION  1991. 2003.   2  . CHOLECYSTECTOMY N/A 11/26/2016   Procedure: LAPAROSCOPIC CHOLECYSTECTOMY WITH INTRAOPERATIVE CHOLANGIOGRAM;  Surgeon: Claud Kelp, MD;  Location: [facility];  Service: General;  Laterality: N/A;  . MASTECTOMY    . WISDOM TOOTH EXTRACTION      OB History   No obstetric history on file.      Home Medications     Prior to Admission medications   Medication Sig Start Date End Date Taking? Authorizing Provider  amphetamine-dextroamphetamine (ADDERALL) 20 MG tablet Take by mouth. 12/23/17  Yes [provider]  benzonatate (TESSALON) 100 MG capsule Take 1 capsule (100 mg total) by mouth every 8 (eight) hours. 02/27/20  Yes Hall-Potvin, [place], PA-C  cetirizine (ZYRTEC ALLERGY) 10 MG tablet Take 1 tablet (10 mg total) by mouth daily. 02/27/20  Yes Hall-Potvin, [place], PA-C  Cholecalciferol (VITAMIN D-3) 5000 units TABS Take 1 tablet by mouth daily.   Yes [provider]  furosemide (LASIX) 20 MG tablet Take 20 mg by mouth.   Yes [provider]  gabapentin (NEURONTIN) 100 MG capsule Take 100 mg by mouth 3 (three) times daily.   Yes [provider]  gemfibrozil (LOPID) 600 MG tablet Take 600 mg by mouth 2 (two) times daily before a meal.   Yes [provider]  hydrochlorothiazide (HYDRODIURIL) 12.5 MG tablet TAKE 1 TABLET BY MOUTH DAILY. 11/18/16  Yes Glori Luis, MD  metoprolol tartrate (LOPRESSOR) 50 MG tablet Take 50 mg by mouth 2 (two) times daily.   Yes [provider]  nystatin (MYCOSTATIN) 100000 UNIT/ML suspension Take 5 mLs (500,000 Units total) by mouth 4 (four) times daily. 02/27/20  Yes Hall-Potvin, [place], PA-C  sertraline (ZOLOFT) 100 MG tablet Take 100 mg by mouth  daily.    Yes [provider]  ALPRAZolam Duanne Moron) 1 MG tablet Take 0.5-1 mg by mouth at bedtime as needed for anxiety.    [provider]  cephALEXin (KEFLEX) 500 MG capsule Take 500 mg by mouth 4 (four) times daily. 02/15/20   [provider]  furosemide (LASIX) 20 MG tablet Take 1 tablet (20 mg total) by mouth daily. 04/16/16 11/20/16  Wellington Hampshire, MD  ibuprofen (ADVIL,MOTRIN) 800 MG tablet Take 1 tablet (800 mg total) by mouth every 8 (eight) hours as needed. 05/17/17   Long, Wonda Olds, MD  lamoTRIgine (LAMICTAL) 25 MG tablet Take 50 mg by mouth  daily. 01/12/20   [provider]  metoprolol succinate (TOPROL-XL) 25 MG 24 hr tablet Take 1 tablet (25 mg total) by mouth daily. Patient taking differently: Take 50 mg by mouth daily.  04/08/16   Leone Haven, MD  mupirocin ointment (BACTROBAN) 2 % Apply 1 application topically daily. "cyst in panty line"    [provider]  pantoprazole (PROTONIX) 20 MG tablet Take 20 mg by mouth daily. 02/17/20   [provider]    Family History Family History  Problem Relation Age of Onset  . Hypertension Mother   . Cancer Maternal Grandmother        Breast  . Cancer Sister        cervical    Social History Social History   Tobacco Use  . Smoking status: Current Every Day Smoker    Packs/day: 1.00    Years: 31.00    Pack years: 31.00    Types: Cigarettes  . Smokeless tobacco: Never Used  Vaping Use  . Vaping Use: Never used  Substance Use Topics  . Alcohol use: Yes    Alcohol/week: 2.0 standard drinks    Types: 2 Cans of beer per week  . Drug use: No     Allergies   Ezetimibe and Prednisone   Review of Systems Review of Systems  Constitutional: Positive for activity change, appetite change, chills and fatigue. Negative for fever.  HENT: Positive for congestion, postnasal drip, rhinorrhea and sinus pressure. Negative for dental problem, ear pain, facial swelling, hearing loss, sinus pain, sore throat, trouble swallowing and voice change.   Eyes: Negative for photophobia, pain and visual disturbance.  Respiratory: Positive for cough. Negative for shortness of breath.   Cardiovascular: Negative for chest pain and palpitations.  Gastrointestinal: Negative for diarrhea and vomiting.  Musculoskeletal: Negative for arthralgias and myalgias.  Neurological: Negative for dizziness and headaches.     Physical Exam Triage Vital Signs ED Triage Vitals  Enc Vitals Group     BP 02/27/20 1340 (!) 140/91     Pulse Rate 02/27/20 1340 68     Resp 02/27/20  1340 20     Temp 02/27/20 1340 98.6 F (37 C)     Temp Source 02/27/20 1340 Oral     SpO2 02/27/20 1340 96 %     Weight --      Height --      Head Circumference --      Peak Flow --      Pain Score 02/27/20 1333 2     Pain Loc --      Pain Edu? --      Excl. in [place]? --    No data found.  Updated Vital Signs BP (!) 140/91 (BP Location: Left Arm)   Pulse 68   Temp 98.6 F (37 C) (Oral)  Resp 20   SpO2 96%   Visual Acuity Right Eye Distance:   Left Eye Distance:   Bilateral Distance:    Right Eye Near:   Left Eye Near:    Bilateral Near:     Physical Exam Constitutional:      General: She is not in acute distress.    Appearance: She is not ill-appearing or diaphoretic.  HENT:     Head: Normocephalic and atraumatic.     Right Ear: Tympanic membrane and ear canal normal.     Left Ear: Tympanic membrane and ear canal normal.     Mouth/Throat:     Mouth: Mucous membranes are moist.     Pharynx: Oropharynx is clear. No oropharyngeal exudate or posterior oropharyngeal erythema.  Eyes:     General: No scleral icterus.    Conjunctiva/sclera: Conjunctivae normal.     Pupils: Pupils are equal, round, and reactive to light.  Neck:     Comments: Trachea midline, negative JVD Cardiovascular:     Rate and Rhythm: Normal rate and regular rhythm.     Heart sounds: No murmur heard. No gallop.   Pulmonary:     Effort: Pulmonary effort is normal. No respiratory distress.     Breath sounds: No wheezing, rhonchi or rales.  Musculoskeletal:     Cervical back: Neck supple. No tenderness.  Lymphadenopathy:     Cervical: No cervical adenopathy.  Skin:    Capillary Refill: Capillary refill takes less than 2 seconds.     Coloration: Skin is not jaundiced or pale.     Findings: No rash.  Neurological:     General: No focal deficit present.     Mental Status: She is alert and oriented to person, place, and time.      UC Treatments / Results  Labs (all labs ordered are  listed, but only abnormal results are displayed) Labs Reviewed  NOVEL CORONAVIRUS, NAA    EKG   Radiology No results found.  Procedures Procedures (including critical care time)  Medications Ordered in UC Medications - No data to display  Initial Impression / Assessment and Plan / UC Course  I have reviewed the triage vital signs and the nursing notes.  Pertinent labs & imaging results that were available during my care of the patient were reviewed by me and considered in my medical decision making (see chart for details).     Patient afebrile, nontoxic, with SpO2 96%.  Covid PCR pending.  Patient to quarantine until results are back.  We will treat supportively as outlined below, continue care w/ onc/surg.  Return precautions discussed, patient verbalized understanding and is agreeable to plan. Final Clinical Impressions(s) / UC Diagnoses   Final diagnoses:  URI with cough and congestion   Discharge Instructions   None    ED Prescriptions    Medication Sig Dispense Auth. Provider   benzonatate (TESSALON) 100 MG capsule Take 1 capsule (100 mg total) by mouth every 8 (eight) hours. 21 capsule Hall-Potvin, [place], PA-C   cetirizine (ZYRTEC ALLERGY) 10 MG tablet Take 1 tablet (10 mg total) by mouth daily. 30 tablet Hall-Potvin, [place], PA-C   nystatin (MYCOSTATIN) 100000 UNIT/ML suspension Take 5 mLs (500,000 Units total) by mouth 4 (four) times daily. 60 mL Hall-Potvin, [place], PA-C     PDMP not reviewed this encounter.   Hall-Potvin, [place], [place] 02/27/20 1440

## 2020-02-27 NOTE — ED Triage Notes (Signed)
Fever, body ache, white coating on tongue and dizziness are patient's complaint.  Patient has developed cough today.   Patient has breast cancer, patient is taking antibiotic-cephalexin.  mastectomy was december 2021

## 2020-02-29 LAB — NOVEL CORONAVIRUS, NAA: SARS-CoV-2, NAA: DETECTED — AB

## 2020-02-29 LAB — SARS-COV-2, NAA 2 DAY TAT

## 2020-03-22 ENCOUNTER — Ambulatory Visit (INDEPENDENT_AMBULATORY_CARE_PROVIDER_SITE_OTHER): Payer: BC Managed Care – PPO | Admitting: Neurology

## 2020-03-22 DIAGNOSIS — F439 Reaction to severe stress, unspecified: Secondary | ICD-10-CM

## 2020-03-22 DIAGNOSIS — R519 Headache, unspecified: Secondary | ICD-10-CM

## 2020-03-22 DIAGNOSIS — G4733 Obstructive sleep apnea (adult) (pediatric): Secondary | ICD-10-CM | POA: Diagnosis not present

## 2020-03-22 DIAGNOSIS — G4489 Other headache syndrome: Secondary | ICD-10-CM

## 2020-03-22 DIAGNOSIS — G4719 Other hypersomnia: Secondary | ICD-10-CM

## 2020-03-22 DIAGNOSIS — R0683 Snoring: Secondary | ICD-10-CM

## 2020-03-22 DIAGNOSIS — H4710 Unspecified papilledema: Secondary | ICD-10-CM

## 2020-03-22 DIAGNOSIS — R0681 Apnea, not elsewhere classified: Secondary | ICD-10-CM

## 2020-03-28 ENCOUNTER — Telehealth: Payer: Self-pay

## 2020-03-28 NOTE — Addendum Note (Signed)
Addended by: Star Age on: 03/28/2020 07:11 AM   Modules accepted: Orders

## 2020-03-28 NOTE — Telephone Encounter (Signed)
I called the pt and we had an extended conversation 20+mins. We discussed in detail the reccommended order for auto pap and pt would like to take time and think over this recommendation and call back to give decision.  Pt reports she will think over the next week and then call to give decision.  Pt also reports she has not heard from Dr. Patrici Ranks office in regards to the referral Dr. Rexene Alberts placed back in November. Pt was advised we have faxed referral information on two separate occasions.  Pt was given the number to Dr. Zenia Resides office to call and see about scheduling.   Pt was advised if referral is still not received to have Dr. Patrici Ranks office to call our office and figure out what the issue is on receiving this information.  Pt verbalized understanding.

## 2020-03-28 NOTE — Progress Notes (Signed)
Patient referred by her eye doctor, seen by me on 01/18/20, patient had a HST on 03/23/20.    Please call and notify the patient that the recent home sleep test showed obstructive sleep apnea in the moderate range. I recommend treatment in the form of autoPAP, which means, that we don't have to bring her in for a sleep study with CPAP, but will let her start using a so called autoPAP machine at home, which is a CPAP-like machine with self-adjusting pressures. We will send the order to a local DME company (of her choice, or as per insurance requirement). The DME representative will fit her with a mask, educate her on how to use the machine, how to put the mask on, etc. I have placed an order in the chart. Please send the order, talk to patient, send report to referring MD. We will need a FU in sleep clinic for 10 weeks post-PAP set up, please arrange that with me or one of our NPs. Also reinforce the need for compliance with treatment. Thanks,   Star Age, MD, PhD [facility])

## 2020-03-28 NOTE — Telephone Encounter (Signed)
-----   Message from Star Age, MD sent at 03/28/2020  7:11 AM EST ----- Patient referred by her eye doctor, seen by me on 01/18/20, patient had a HST on 03/23/20.    Please call and notify the patient that the recent home sleep test showed obstructive sleep apnea in the moderate range. I recommend treatment in the form of autoPAP, which means, that we don't have to bring her in for a sleep study with CPAP, but will let her start using a so called autoPAP machine at home, which is a CPAP-like machine with self-adjusting pressures. We will send the order to a local DME company (of her choice, or as per insurance requirement). The DME representative will fit her with a mask, educate her on how to use the machine, how to put the mask on, etc. I have placed an order in the chart. Please send the order, talk to patient, send report to referring MD. We will need a FU in sleep clinic for 10 weeks post-PAP set up, please arrange that with me or one of our NPs. Also reinforce the need for compliance with treatment. Thanks,   Star Age, MD, PhD [facility])

## 2020-03-28 NOTE — Procedures (Signed)
   [facility]  HOME SLEEP TEST (Watch PAT)  STUDY DATE: 03/22/20  DOB: 16-Oct-1970  MRN: [id number]  ORDERING CLINICIAN: Star Age, MD, PhD   REFERRING CLINICIAN: Remi Haggard, FNP   CLINICAL INFORMATION/HISTORY: 50 year old woman with a history of irritable bowel syndrome, seizures (by chart review, during pregnancy in 1991 secondary to toxemia), thyroid nodules, hypertension, depression, anxiety, cholelithiasis, fatty liver, obesity, and DCIS, who reports snoring and excessive daytime somnolence.   Epworth sleepiness score: 11/24.  BMI: 37.0 kg/m  Neck Circumference: 15.75 "  FINDINGS:   Total Record Time (hours, min): 6 h 25 min  Total Sleep Time (hours, min):  5 h 39 min   Percent REM (%):    29.34 %   Calculated pAHI (per hour):  22.4       REM pAHI: 41.3   NREM pAHI: 14.6 Supine AHI: 33.1   Oxygen Saturation (%) Mean: 94  Minimum oxygen saturation (%):        82   O2 Saturation Range (%): 82-98  O2Saturation (minutes) <=88%: 3.9 min   Pulse Mean (bpm):    54  Pulse Range (41-83)   IMPRESSION: OSA (obstructive sleep apnea)  RECOMMENDATION:  This home sleep test demonstrates moderate obstructive sleep apnea with a total AHI of 22.4/hour and O2 nadir of 82%. Snoring was noted and appeared to be intermittent and in the mild to moderate range. Treatment with positive airway pressure is recommended. The patient will be advised to proceed with an autoPAP titration/trial at home for now. A full night titration study may be considered to optimize treatment settings, if needed down the road. Please note that untreated obstructive sleep apnea may carry additional perioperative morbidity. Patients with significant obstructive sleep apnea should receive perioperative PAP therapy and the surgeons and particularly the anesthesiologist should be informed of the diagnosis and the severity of the sleep disordered breathing. The patient should be cautioned not  to drive, work at heights, or operate dangerous or heavy equipment when tired or sleepy. Review and reiteration of good sleep hygiene measures should be pursued with any patient. Other causes of the patient's symptoms, including circadian rhythm disturbances, an underlying mood disorder, medication effect and/or an underlying medical problem cannot be ruled out based on this test. Clinical correlation is recommended. The patient and her referring provider will be notified of the test results. The patient will be seen in follow up in sleep clinic at [facility].  I certify that I have reviewed the raw data recording prior to the issuance of this report in accordance with the standards of the American Academy of Sleep Medicine (AASM).  INTERPRETING PHYSICIAN:  Star Age, MD, PhD  Board Certified in Neurology and Sleep Medicine [facility] [address], [place], [place] [address] [phone]

## 2020-05-12 DIAGNOSIS — M1712 Unilateral primary osteoarthritis, left knee: Secondary | ICD-10-CM | POA: Insufficient documentation

## 2020-06-07 ENCOUNTER — Other Ambulatory Visit: Payer: Self-pay

## 2020-06-07 ENCOUNTER — Ambulatory Visit
Admission: EM | Admit: 2020-06-07 | Discharge: 2020-06-07 | Disposition: A | Discharge status: Home / Self Care | Payer: BC Managed Care – PPO

## 2020-06-07 DIAGNOSIS — B370 Candidal stomatitis: Secondary | ICD-10-CM

## 2020-06-07 MED ORDER — NYSTATIN 100000 UNIT/ML MT SUSP: 500000.0000 [IU] | Freq: Four times a day (QID) | OROMUCOSAL | 0 refills | Status: DC | PRN

## 2020-06-07 NOTE — ED Provider Notes (Signed)
[name]    CSN: [id number] Arrival date & time: 06/07/20  1610      History   Chief Complaint Chief Complaint  Patient presents with  . Mouth Lesions    HPI Melissa Shepherd is a 50 y.o. female.   HPI Patient presents for evaluation of cracking of lips and tongue 6 days. History of thrush in January. Currently prescribed oral antineoplastic medication. Reports no problems since January with thrust. Tongue, throat, and lips are irritated and discomfort with swallowing. No recent changes in medications or diet. Past Medical History:  Diagnosis Date  . Anginal pain ([place])   . Anxiety    panic attacks  . Cancer ([place])   . Chicken pox   . Cholelithiasis with acute on chronic cholecystitis without biliary obstruction 11/26/2016  . Depression   . Diarrhea   . Dyspnea    with chest pain  . Fatty liver   . Frequent headaches   . Hypertension   . IBS (irritable bowel syndrome)   . Multiple thyroid nodules   . Seizures ([place])    pregenancy- toxemia- 1991    Patient Active Problem List   Diagnosis Date Noted  . Cholelithiasis with acute on chronic cholecystitis without biliary obstruction 11/26/2016  . Anxiety 08/18/2015  . Hyperlipidemia 08/18/2015  . Vitamin D deficiency 08/18/2015  . IBS (irritable bowel syndrome) 05/01/2015  . Lumbago 10/06/2014  . Bilateral thoracic back pain 10/06/2014  . Chest pain 07/09/2014  . Leg swelling 07/09/2014  . Essential hypertension 06/11/2014  . Depression 06/11/2014  . Tobacco use disorder 06/11/2014    Past Surgical History:  Procedure Laterality Date  . CESAREAN SECTION  1991. 2003.   2  . CHOLECYSTECTOMY N/A 11/26/2016   Procedure: LAPAROSCOPIC CHOLECYSTECTOMY WITH INTRAOPERATIVE CHOLANGIOGRAM;  Surgeon: Fanny Skates, MD;  Location: [place];  Service: General;  Laterality: N/A;  . MASTECTOMY    . WISDOM TOOTH EXTRACTION      OB History   No obstetric history on file.      Home Medications    Prior to  Admission medications   Medication Sig Start Date End Date Taking? Authorizing Provider  anastrozole (ARIMIDEX) 1 MG tablet Take 1 mg by mouth daily.   Yes [provider]  ALPRAZolam Duanne Moron) 1 MG tablet Take 0.5-1 mg by mouth at bedtime as needed for anxiety.    [provider]  amphetamine-dextroamphetamine (ADDERALL) 20 MG tablet Take by mouth. 12/23/17   [provider]  Cholecalciferol (VITAMIN D-3) 5000 units TABS Take 1 tablet by mouth daily.    [provider]  furosemide (LASIX) 20 MG tablet Take 1 tablet (20 mg total) by mouth daily. 04/16/16 11/20/16  Wellington Hampshire, MD  furosemide (LASIX) 20 MG tablet Take 20 mg by mouth.    [provider]  gabapentin (NEURONTIN) 100 MG capsule Take 100 mg by mouth 3 (three) times daily.    [provider]  gemfibrozil (LOPID) 600 MG tablet Take 600 mg by mouth 2 (two) times daily before a meal.    [provider]  hydrochlorothiazide (HYDRODIURIL) 12.5 MG tablet TAKE 1 TABLET BY MOUTH DAILY. 11/18/16   Leone Haven, MD  lamoTRIgine (LAMICTAL) 25 MG tablet Take 50 mg by mouth daily. 01/12/20   [provider]  metoprolol succinate (TOPROL-XL) 25 MG 24 hr tablet Take 1 tablet (25 mg total) by mouth daily. Patient taking differently: Take 50 mg by mouth daily.  04/08/16   Leone Haven,  MD  metoprolol tartrate (LOPRESSOR) 50 MG tablet Take 50 mg by mouth 2 (two) times daily.    [provider]  mupirocin ointment (BACTROBAN) 2 % Apply 1 application topically daily. "cyst in panty line"    [provider]  pantoprazole (PROTONIX) 20 MG tablet Take 20 mg by mouth daily. 02/17/20   [provider]  sertraline (ZOLOFT) 100 MG tablet Take 100 mg by mouth daily.     [provider]    Family History Family History  Problem Relation Age of Onset  . Hypertension Mother   . Cancer Maternal Grandmother        Breast  . Cancer Sister         cervical    Social History Social History   Tobacco Use  . Smoking status: Current Every Day Smoker    Packs/day: 1.00    Years: 31.00    Pack years: 31.00    Types: Cigarettes  . Smokeless tobacco: Never Used  Vaping Use  . Vaping Use: Never used  Substance Use Topics  . Alcohol use: Yes    Alcohol/week: 2.0 standard drinks    Types: 2 Cans of beer per week  . Drug use: No     Allergies   Ezetimibe and Prednisone   Review of Systems Review of Systems Pertinent negatives listed in HPI  Physical Exam Triage Vital Signs ED Triage Vitals  Enc Vitals Group     BP 06/07/20 1705 (!) 145/88     Pulse Rate 06/07/20 1705 73     Resp 06/07/20 1705 18     Temp 06/07/20 1705 99.2 F (37.3 C)     Temp Source 06/07/20 1705 Oral     SpO2 06/07/20 1705 95 %     Weight --      Height --      Head Circumference --      Peak Flow --      Pain Score 06/07/20 1710 1     Pain Loc --      Pain Edu? --      Excl. in [place]? --    No data found.  Updated Vital Signs BP (!) 145/88 (BP Location: Left Arm)   Pulse 73   Temp 99.2 F (37.3 C) (Oral)   Resp 18   SpO2 95%   Visual Acuity Right Eye Distance:   Left Eye Distance:   Bilateral Distance:    Right Eye Near:   Left Eye Near:    Bilateral Near:     Physical Exam Constitutional:      Appearance: She is obese.  HENT:     Mouth/Throat:     Comments: Leukoplakia present tongue and soft palate  Dry, erythematous lips  Cardiovascular:     Rate and Rhythm: Normal rate and regular rhythm.  Pulmonary:     Effort: Pulmonary effort is normal.     Breath sounds: Normal breath sounds and air entry.  Neurological:     Mental Status: She is alert.  Psychiatric:        Attention and Perception: Attention normal.        Behavior: Behavior is cooperative.      UC Treatments / Results  Labs (all labs ordered are listed, but only abnormal results are displayed) Labs Reviewed - No data to  display  EKG   Radiology No results found.  Procedures Procedures (including critical care time)  Medications Ordered in UC Medications - No data to  display  Initial Impression / Assessment and Plan / UC Course  I have reviewed the triage vital signs and the nursing notes.  Pertinent labs & imaging results that were available during my care of the patient were reviewed by me and considered in my medical decision making (see chart for details).    Nystatin 5 ml's 4 times daily as needed for oral thrush. Increase hydration of fluids to keep oral mucosa moist. Follow-up with PCP as needed. Final Clinical Impressions(s) / UC Diagnoses   Final diagnoses:  Oral thrush   Discharge Instructions   None    ED Prescriptions    Medication Sig Dispense Auth. Provider   nystatin (MYCOSTATIN) 100000 UNIT/ML suspension Use as directed 5 mLs (500,000 Units total) in the mouth or throat 4 (four) times daily as needed (Oral irritation or Oral thrush). 180 mL Scot Jun, FNP     PDMP not reviewed this encounter.   Scot Jun, [place] 06/09/20 [phone]

## 2020-06-07 NOTE — ED Triage Notes (Signed)
Pt c/o dry cracking to lips and tongue since Friday. States hx of thrush in January, feels similar.

## 2020-06-19 ENCOUNTER — Telehealth: Payer: Self-pay | Admitting: Neurology

## 2020-06-19 NOTE — Telephone Encounter (Signed)
I received an office note from Dr. Clent Jacks with [facility].  Patient had a visit on 05/25/2020.  Eye examination showed no evidence of disc edema, no disc pallor, visual field showed normal blind spot bilaterally, full fields.  No optic nerve swelling on exam or on OCT.  She was advised to follow-up in 3 years.  FYI, nothing further needed.

## 2020-07-27 ENCOUNTER — Other Ambulatory Visit: Payer: Self-pay | Admitting: Unknown Physician Specialty

## 2020-07-27 DIAGNOSIS — E041 Nontoxic single thyroid nodule: Secondary | ICD-10-CM

## 2020-08-07 ENCOUNTER — Emergency Department (HOSPITAL_BASED_OUTPATIENT_CLINIC_OR_DEPARTMENT_OTHER): Payer: BC Managed Care – PPO

## 2020-08-07 ENCOUNTER — Encounter (HOSPITAL_COMMUNITY): Payer: Self-pay | Admitting: *Deleted

## 2020-08-07 ENCOUNTER — Emergency Department (HOSPITAL_COMMUNITY)
Admission: EM | Admit: 2020-08-07 | Discharge: 2020-08-07 | Disposition: A | Discharge status: Home / Self Care | Payer: BC Managed Care – PPO | Attending: Emergency Medicine | Admitting: Emergency Medicine

## 2020-08-07 ENCOUNTER — Other Ambulatory Visit: Payer: Self-pay

## 2020-08-07 ENCOUNTER — Encounter (HOSPITAL_BASED_OUTPATIENT_CLINIC_OR_DEPARTMENT_OTHER): Payer: Self-pay | Admitting: *Deleted

## 2020-08-07 ENCOUNTER — Emergency Department (HOSPITAL_BASED_OUTPATIENT_CLINIC_OR_DEPARTMENT_OTHER)
Admission: EM | Admit: 2020-08-07 | Discharge: 2020-08-08 | Disposition: A | Discharge status: Home / Self Care | Payer: BC Managed Care – PPO | Source: Home / Self Care | Attending: Emergency Medicine | Admitting: Emergency Medicine

## 2020-08-07 DIAGNOSIS — F419 Anxiety disorder, unspecified: Secondary | ICD-10-CM | POA: Diagnosis not present

## 2020-08-07 DIAGNOSIS — F17210 Nicotine dependence, cigarettes, uncomplicated: Secondary | ICD-10-CM | POA: Insufficient documentation

## 2020-08-07 DIAGNOSIS — Z859 Personal history of malignant neoplasm, unspecified: Secondary | ICD-10-CM | POA: Insufficient documentation

## 2020-08-07 DIAGNOSIS — I10 Essential (primary) hypertension: Secondary | ICD-10-CM | POA: Insufficient documentation

## 2020-08-07 DIAGNOSIS — Z79899 Other long term (current) drug therapy: Secondary | ICD-10-CM | POA: Insufficient documentation

## 2020-08-07 DIAGNOSIS — R531 Weakness: Secondary | ICD-10-CM | POA: Diagnosis not present

## 2020-08-07 DIAGNOSIS — R519 Headache, unspecified: Secondary | ICD-10-CM | POA: Insufficient documentation

## 2020-08-07 DIAGNOSIS — R202 Paresthesia of skin: Secondary | ICD-10-CM | POA: Insufficient documentation

## 2020-08-07 LAB — COMPREHENSIVE METABOLIC PANEL
ALT: 30 U/L (ref 0–44)
AST: 28 U/L (ref 15–41)
Albumin: 4.3 g/dL (ref 3.5–5.0)
Alkaline Phosphatase: 45 U/L (ref 38–126)
Anion gap: 8 (ref 5–15)
BUN: 8 mg/dL (ref 6–20)
CO2: 28 mmol/L (ref 22–32)
Calcium: 9 mg/dL (ref 8.9–10.3)
Chloride: 106 mmol/L (ref 98–111)
Creatinine, Ser: 0.76 mg/dL (ref 0.44–1.00)
GFR, Estimated: >60 mL/min (ref >60)
Glucose, Bld: 86 mg/dL (ref 70–99)
Potassium: 3.3 mmol/L — ABNORMAL LOW (ref 3.5–5.1)
Sodium: 142 mmol/L (ref 135–145)
Total Bilirubin: 0.8 mg/dL (ref 0.3–1.2)
Total Protein: 6.9 g/dL (ref 6.5–8.1)

## 2020-08-07 LAB — CBC WITH DIFFERENTIAL/PLATELET
Abs Immature Granulocytes: 0.02 10*3/uL (ref 0.00–0.07)
Basophils Absolute: 0 10*3/uL (ref 0.0–0.1)
Basophils Relative: 1 %
Eosinophils Absolute: 0.1 10*3/uL (ref 0.0–0.5)
Eosinophils Relative: 2 %
HCT: 41.4 % (ref 36.0–46.0)
Hemoglobin: 13.5 g/dL (ref 12.0–15.0)
Immature Granulocytes: 0 %
Lymphocytes Relative: 33 %
Lymphs Abs: 2.6 10*3/uL (ref 0.7–4.0)
MCH: 30.6 pg (ref 26.0–34.0)
MCHC: 32.6 g/dL (ref 30.0–36.0)
MCV: 93.9 fL (ref 80.0–100.0)
Monocytes Absolute: 0.4 10*3/uL (ref 0.1–1.0)
Monocytes Relative: 6 %
Neutro Abs: 4.6 10*3/uL (ref 1.7–7.7)
Neutrophils Relative %: 58 %
Platelets: 245 10*3/uL (ref 150–400)
RBC: 4.41 MIL/uL (ref 3.87–5.11)
RDW: 15.9 % — ABNORMAL HIGH (ref 11.5–15.5)
WBC: 7.8 10*3/uL (ref 4.0–10.5)
nRBC: 0 % (ref 0.0–0.2)

## 2020-08-07 MED ORDER — SODIUM CHLORIDE 0.9 % IV BOLUS: 1000.0000 mL | Freq: Once | INTRAVENOUS | Status: DC

## 2020-08-07 MED ORDER — SODIUM CHLORIDE 0.9 % IV BOLUS
500.0000 mL | Freq: Once | INTRAVENOUS | Status: AC
  Administered 2020-08-08: 500 mL via INTRAVENOUS

## 2020-08-07 NOTE — ED Triage Notes (Signed)
To ER via EMS. Numbness and cramping in her hands. Anxiety. Headache. She took Xanax prior to coming to the ER. She was driving this afternoon and felt like she was going to pass out. She was able to complete the drive home.

## 2020-08-07 NOTE — ED Triage Notes (Signed)
Pt complains of headache, extremity tingling, anxiety x 1 week, reports felt different today while driving home. She took xanax prior to EMS arrival.  BP 118/62 HR 73 SPO2 99% CBG 102 RR 18

## 2020-08-07 NOTE — ED Provider Notes (Signed)
Emergency Medicine Provider Triage Evaluation Note  Melissa Shepherd , a 50 y.o. female  was evaluated in triage.  Pt reports multiple complaints. She complains of lle pain that started a few weeks ago. She further reports bilat arm and leg tingling and a feeling of water running over her face.  Review of Systems  Positive: Lle pain, bilat arm/leg paresthesias Negative: Vision changes  Physical Exam  BP (!) 140/95 (BP Location: Left Arm)   Pulse 67   Temp 97.9 F (36.6 C) (Oral)   Resp 18   SpO2 99%  Gen:   Awake, no distress   Resp:  Normal effort  MSK:   Moves extremities without difficulty  Other:  Clear speech  Medical Decision Making  Medically screening exam initiated at 6:36 PM.  Appropriate orders placed.  Juwana Thoreson was informed that the remainder of the evaluation will be completed by another provider, this initial triage assessment does not replace that evaluation, and the importance of remaining in the ED until their evaluation is complete.    Bishop Dublin 08/07/20 1836    Truddie Hidden, MD 08/07/20 [phone]

## 2020-08-08 LAB — CBC WITH DIFFERENTIAL/PLATELET
Abs Immature Granulocytes: 0.01 10*3/uL (ref 0.00–0.07)
Basophils Absolute: 0 10*3/uL (ref 0.0–0.1)
Basophils Relative: 1 %
Eosinophils Absolute: 0.1 10*3/uL (ref 0.0–0.5)
Eosinophils Relative: 2 %
HCT: 38.4 % (ref 36.0–46.0)
Hemoglobin: 13 g/dL (ref 12.0–15.0)
Immature Granulocytes: 0 %
Lymphocytes Relative: 43 %
Lymphs Abs: 2.7 10*3/uL (ref 0.7–4.0)
MCH: 31 pg (ref 26.0–34.0)
MCHC: 33.9 g/dL (ref 30.0–36.0)
MCV: 91.6 fL (ref 80.0–100.0)
Monocytes Absolute: 0.4 10*3/uL (ref 0.1–1.0)
Monocytes Relative: 7 %
Neutro Abs: 2.9 10*3/uL (ref 1.7–7.7)
Neutrophils Relative %: 47 %
Platelets: 219 10*3/uL (ref 150–400)
RBC: 4.19 MIL/uL (ref 3.87–5.11)
RDW: 16 % — ABNORMAL HIGH (ref 11.5–15.5)
WBC: 6.2 10*3/uL (ref 4.0–10.5)
nRBC: 0 % (ref 0.0–0.2)

## 2020-08-08 LAB — BASIC METABOLIC PANEL
Anion gap: 7 (ref 5–15)
BUN: 7 mg/dL (ref 6–20)
CO2: 27 mmol/L (ref 22–32)
Calcium: 8.9 mg/dL (ref 8.9–10.3)
Chloride: 107 mmol/L (ref 98–111)
Creatinine, Ser: 0.77 mg/dL (ref 0.44–1.00)
GFR, Estimated: >60 mL/min (ref >60)
Glucose, Bld: 81 mg/dL (ref 70–99)
Potassium: 3.2 mmol/L — ABNORMAL LOW (ref 3.5–5.1)
Sodium: 141 mmol/L (ref 135–145)

## 2020-08-08 MED ORDER — HYDROMORPHONE HCL 1 MG/ML IJ SOLN
0.5000 mg | Freq: Once | INTRAMUSCULAR | Status: AC
  Administered 2020-08-08: 0.5 mg via INTRAVENOUS
  Filled 2020-08-08: qty 1

## 2020-08-08 MED ORDER — HYDROMORPHONE HCL 1 MG/ML IJ SOLN: 1.0000 mg | Freq: Once | INTRAMUSCULAR | Status: DC

## 2020-08-08 NOTE — ED Provider Notes (Signed)
[facility] EMERGENCY DEPARTMENT Provider Note   CSN: [id number] Arrival date & time: 08/07/20  2120     History No chief complaint on file.   Melissa Shepherd is a 50 y.o. female.  Presented to ER with multiple complaints.  Patient states that earlier today she had sensation of water running over her head and face and then had tingling in both her arms and legs.  States that the symptoms have generally improved.  Still having a mild to moderate dull achy headache.  Not worsening of her life, not sudden onset.  Felt lightheaded earlier.  No chest pain, no syncope.  No difficulty in breathing.  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 2003.   2   CHOLECYSTECTOMY N/A 11/26/2016   Procedure: LAPAROSCOPIC CHOLECYSTECTOMY WITH INTRAOPERATIVE CHOLANGIOGRAM;  Surgeon: Fanny Skates, MD;  Location: [facility];  Service: General;  Laterality: N/A;   MASTECTOMY     WISDOM TOOTH EXTRACTION       OB History   No obstetric  history on file.     Family History  Problem Relation Age of Onset   Hypertension Mother    Cancer Maternal Grandmother        Breast   Cancer Sister        cervical    Social History   Tobacco Use   Smoking status: Every Day    Packs/day: 1.00    Years: 31.00    Pack years: 31.00    Types: Cigarettes   Smokeless tobacco: Never  Vaping Use   Vaping Use: Never used  Substance Use Topics   Alcohol use: Yes    Alcohol/week: 2.0 standard drinks    Types: 2 Cans of beer per week   Drug use: No    Home Medications Prior to Admission medications   Medication Sig Start Date End Date Taking? Authorizing Provider  ALPRAZolam Duanne Moron) 1 MG tablet Take 0.5-1 mg by mouth at bedtime as needed for anxiety.   Yes [provider]  Cholecalciferol (VITAMIN D-3) 5000 units TABS Take 1 tablet by mouth daily.   Yes [provider]  furosemide (LASIX) 20 MG tablet Take 20 mg by mouth.   Yes [provider]  gabapentin (NEURONTIN) 100 MG capsule Take 100 mg by mouth 3 (three) times daily.   Yes [provider]  hydrochlorothiazide (HYDRODIURIL) 12.5 MG tablet TAKE  1 TABLET BY MOUTH DAILY. 11/18/16  Yes Leone Haven, MD  metoprolol succinate (TOPROL-XL) 25 MG 24 hr tablet Take 1 tablet (25 mg total) by mouth daily. Patient taking differently: Take 50 mg by mouth daily. 04/08/16  Yes Leone Haven, MD  metoprolol tartrate (LOPRESSOR) 50 MG tablet Take 50 mg by mouth 2 (two) times daily.   Yes [provider]  pantoprazole (PROTONIX) 20 MG tablet Take 20 mg by mouth daily. 02/17/20  Yes [provider]  rosuvastatin (CRESTOR) 40 MG tablet Take 40 mg by mouth daily.   Yes [provider]  sertraline (ZOLOFT) 100 MG tablet Take 100 mg by mouth daily.    Yes [provider]  amphetamine-dextroamphetamine (ADDERALL) 20 MG tablet Take by mouth. 12/23/17   [provider]  anastrozole (ARIMIDEX) 1 MG tablet Take 1  mg by mouth daily.    [provider]  furosemide (LASIX) 20 MG tablet Take 1 tablet (20 mg total) by mouth daily. 04/16/16 11/20/16  Wellington Hampshire, MD  gemfibrozil (LOPID) 600 MG tablet Take 600 mg by mouth 2 (two) times daily before a meal.    [provider]  lamoTRIgine (LAMICTAL) 25 MG tablet Take 50 mg by mouth daily. 01/12/20   [provider]  meloxicam (MOBIC) 15 MG tablet Take 15 mg by mouth as needed for pain.    [provider]  mupirocin ointment (BACTROBAN) 2 % Apply 1 application topically daily. "cyst in panty line"    [provider]  nystatin (MYCOSTATIN) 100000 UNIT/ML suspension Use as directed 5 mLs (500,000 Units total) in the mouth or throat 4 (four) times daily as needed (Oral irritation or Oral thrush). 06/07/20   Scot Jun, FNP    Allergies    Ezetimibe and Prednisone  Review of Systems   Review of Systems  Constitutional:  Negative for chills and fever.  HENT:  Negative for ear pain and sore throat.   Eyes:  Negative for pain and visual disturbance.  Respiratory:  Negative for cough and shortness of breath.   Cardiovascular:  Negative for chest pain and palpitations.  Gastrointestinal:  Negative for abdominal pain and vomiting.  Genitourinary:  Negative for dysuria and hematuria.  Musculoskeletal:  Negative for arthralgias and back pain.  Skin:  Negative for color change and rash.  Neurological:  Positive for light-headedness, numbness and headaches. Negative for seizures and syncope.  All other systems reviewed and are negative.  Physical Exam Updated Vital Signs BP (!) 148/80 (BP Location: Left Arm)   Pulse 73   Temp 98.5 F (36.9 C) (Oral)   Resp 15   Ht 5\' 7"  (1.702 m)   Wt 107 kg   SpO2 100%   BMI 36.95 kg/m   Physical Exam Vitals and nursing note reviewed.  Constitutional:      General: She is not in acute distress.    Appearance: She is well-developed.  HENT:     Head: Normocephalic  and atraumatic.  Eyes:     Conjunctiva/sclera: Conjunctivae normal.  Cardiovascular:     Rate and Rhythm: Normal rate and regular rhythm.     Heart sounds: No murmur heard. Pulmonary:     Effort: Pulmonary effort is normal. No respiratory distress.     Breath sounds: Normal breath sounds.  Abdominal:     Palpations: Abdomen is soft.     Tenderness: There is no abdominal tenderness.  Musculoskeletal:     Cervical back: Neck supple.  Skin:    General: Skin is warm and dry.  Neurological:     General: No focal deficit present.     Mental Status: She is alert.     Comments: AAOx3 CN 2-12 intact, speech clear visual fields intact 5/5 strength in b/l UE and LE Sensation to light touch intact in b/l UE and LE Normal FNF Normal gait    ED Results / Procedures / Treatments   Labs (all labs ordered are listed, but only abnormal results are displayed) Labs Reviewed  CBC WITH DIFFERENTIAL/PLATELET - Abnormal; Notable for the following components:      Result Value   RDW 16.0 (*)    All other components within normal limits  BASIC METABOLIC PANEL - Abnormal; Notable for the following components:   Potassium 3.2 (*)    All other components within normal limits    EKG EKG Interpretation  Date/Time:  Tuesday August 08 2020 00:01:31 EDT Ventricular Rate:  60 PR Interval:  153 QRS Duration: 94 QT Interval:  429 QTC Calculation: 429 R Axis:   69 Text Interpretation: Sinus rhythm Confirmed by Madalyn Rob [phone]) on 08/08/2020 12:29:32 AM  Radiology DG Chest 2 View  Result Date: 08/08/2020 CLINICAL DATA:  Near syncope EXAM: CHEST - 2 VIEW COMPARISON:  03/23/2020 FINDINGS: The heart size and mediastinal contours are within normal limits. Both lungs are clear. The visualized skeletal structures are unremarkable. IMPRESSION: No active cardiopulmonary disease. Electronically Signed   By: Rolm Baptise M.D.   On: 08/08/2020 00:01   CT Head Wo Contrast  Result Date:  08/07/2020 CLINICAL DATA:  Headache EXAM: CT HEAD WITHOUT CONTRAST TECHNIQUE: Contiguous axial images were obtained from the base of the skull through the vertex without intravenous contrast. COMPARISON:  None. FINDINGS: Brain: There is no mass, hemorrhage or extra-axial collection. The size and configuration of the ventricles and extra-axial CSF spaces are normal. The brain parenchyma is normal, without acute or chronic infarction. Vascular: No abnormal hyperdensity of the major intracranial arteries or dural venous sinuses. No intracranial atherosclerosis. Skull: The visualized skull base, calvarium and extracranial soft tissues are normal. Sinuses/Orbits: No fluid levels or advanced mucosal thickening of the visualized paranasal sinuses. No mastoid or middle ear effusion. The orbits are normal. IMPRESSION: Normal head CT. Electronically Signed   By: Ulyses Jarred M.D.   On: 08/07/2020 23:53    Procedures Procedures   Medications Ordered in ED Medications  sodium chloride 0.9 % bolus 500 mL (0 mLs Intravenous Stopped 08/08/20 0104)  HYDROmorphone (DILAUDID) injection 0.5 mg (0.5 mg Intravenous Given 08/08/20 0100)    ED Course  I have reviewed the triage vital signs and the nursing notes.  Pertinent labs & imaging results that were available during my care of the patient were reviewed by me and considered in my medical decision making (see chart for details).    MDM Rules/Calculators/A&P                          50 year old lady presenting to ER with multiple vague complaints.  These included sensation of water running over her head and tingling sensation in her upper and lower extremities, bilaterally.  On exam patient appears well in no distress, normal neurologic exam.  CT head negative.  Basic labs normal.  On reassessment she remains well-appearing with stable vital signs.  Given the reassuring work-up and her current clinical appearance believe she can be managed in the outpatient setting.   Given  reassuring head imaging and normal neuro exam, doubt acute neuro process today.  I recommend she follow-up with her primary doctors.  Patient was ultimately discharged home.    After the discussed management above, the patient was determined to be safe for discharge.  The patient was in agreement with this plan and all questions regarding their care were answered.  ED return precautions were discussed and the patient will return to the ED with any significant worsening of condition.  Final Clinical Impression(s) / ED Diagnoses Final diagnoses:  Nonintractable headache, unspecified chronicity pattern, unspecified headache type  Tingling in extremities    Rx / DC Orders ED Discharge Orders     None        Lucrezia Starch, MD 08/08/20 0451

## 2020-08-08 NOTE — Discharge Instructions (Signed)
Your work-up today was reassuring.  Your potassium was slightly low.  I strongly recommend follow-up with your primary care doctor as well as your cancer doctor.  If you develop chest pain, difficulty in breathing or other new concerning symptom, come back to ER for reassessment.

## 2020-08-24 ENCOUNTER — Ambulatory Visit
Admission: RE | Admit: 2020-08-24 | Discharge: 2020-08-24 | Disposition: A | Discharge status: Home / Self Care | Payer: BC Managed Care – PPO | Source: Ambulatory Visit | Attending: Unknown Physician Specialty | Admitting: Unknown Physician Specialty

## 2020-08-24 ENCOUNTER — Other Ambulatory Visit: Payer: Self-pay

## 2020-08-24 DIAGNOSIS — E041 Nontoxic single thyroid nodule: Secondary | ICD-10-CM | POA: Diagnosis not present

## 2020-11-11 ENCOUNTER — Emergency Department (HOSPITAL_BASED_OUTPATIENT_CLINIC_OR_DEPARTMENT_OTHER): Payer: BC Managed Care – PPO

## 2020-11-11 ENCOUNTER — Encounter (HOSPITAL_BASED_OUTPATIENT_CLINIC_OR_DEPARTMENT_OTHER): Payer: Self-pay | Admitting: Emergency Medicine

## 2020-11-11 ENCOUNTER — Emergency Department (HOSPITAL_BASED_OUTPATIENT_CLINIC_OR_DEPARTMENT_OTHER)
Admission: EM | Admit: 2020-11-11 | Discharge: 2020-11-11 | Disposition: A | Discharge status: Home / Self Care | Payer: BC Managed Care – PPO | Attending: Emergency Medicine | Admitting: Emergency Medicine

## 2020-11-11 ENCOUNTER — Other Ambulatory Visit: Payer: Self-pay

## 2020-11-11 DIAGNOSIS — T189XXA Foreign body of alimentary tract, part unspecified, initial encounter: Secondary | ICD-10-CM | POA: Diagnosis not present

## 2020-11-11 DIAGNOSIS — Z79899 Other long term (current) drug therapy: Secondary | ICD-10-CM | POA: Insufficient documentation

## 2020-11-11 DIAGNOSIS — I10 Essential (primary) hypertension: Secondary | ICD-10-CM | POA: Diagnosis not present

## 2020-11-11 DIAGNOSIS — X58XXXA Exposure to other specified factors, initial encounter: Secondary | ICD-10-CM | POA: Insufficient documentation

## 2020-11-11 DIAGNOSIS — Z859 Personal history of malignant neoplasm, unspecified: Secondary | ICD-10-CM | POA: Insufficient documentation

## 2020-11-11 DIAGNOSIS — F17210 Nicotine dependence, cigarettes, uncomplicated: Secondary | ICD-10-CM | POA: Diagnosis not present

## 2020-11-11 NOTE — ED Triage Notes (Signed)
Pt reports she swallowed at small watch battery approx 3 days ago

## 2020-11-11 NOTE — ED Notes (Signed)
Patient transported to X-ray 

## 2020-11-11 NOTE — ED Notes (Signed)
Pt left before receiving D/C paper or getting repeat vital signs.

## 2020-11-11 NOTE — ED Provider Notes (Signed)
[facility] EMERGENCY DEPARTMENT Provider Note   CSN: [id number] Arrival date & time: 11/11/20  1544     History Chief Complaint  Patient presents with   Foreign Body    Melissa Shepherd is a 50 y.o. female.  Patient presents today with consumption of foreign body.  States that 4 days ago she accidentally consumed a small watch battery.  States that the battery was in a shot glass that she drank out of and did not realize.  Immediately after, patient attempted to vomit without success.  Has since been taking stool softeners to assist with passing of foreign body.  Is asymptomatic at this time, denying fevers, chills, nausea, vomiting, diarrhea, abdominal pain, hematochezia.  The history is provided by the patient. No language interpreter was used.  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 2003.   2   CHOLECYSTECTOMY N/A 11/26/2016   Procedure: LAPAROSCOPIC CHOLECYSTECTOMY WITH INTRAOPERATIVE CHOLANGIOGRAM;  Surgeon: Fanny Skates, MD;  Location: [facility] OR;  Service: General;  Laterality: N/A;   MASTECTOMY     WISDOM TOOTH EXTRACTION       OB History   No obstetric history on file.     Family History  Problem Relation Age of Onset   Hypertension Mother    Cancer Maternal Grandmother        Breast   Cancer Sister        cervical    Social History   Tobacco Use   Smoking status: Every Day    Packs/day: 1.00    Years: 31.00    Pack years: 31.00    Types: Cigarettes   Smokeless tobacco: Never  Vaping Use   Vaping Use: Never used  Substance Use Topics   Alcohol use: Yes    Alcohol/week: 2.0 standard drinks    Types: 2 Cans of beer per week   Drug use: No    Home Medications Prior to Admission medications   Medication Sig Start Date End Date Taking? Authorizing Provider  ALPRAZolam Duanne Moron) 1 MG tablet Take 0.5-1 mg by mouth at bedtime as needed for anxiety.    [provider]  amphetamine-dextroamphetamine (ADDERALL) 20 MG tablet Take by mouth. 12/23/17   [provider]  anastrozole (ARIMIDEX) 1 MG tablet Take 1 mg by mouth daily.    [provider]  Cholecalciferol (VITAMIN D-3) 5000 units TABS Take 1 tablet by mouth daily.    [provider]  furosemide (LASIX) 20 MG tablet Take 1 tablet (20 mg total) by mouth daily. 04/16/16 11/20/16  Wellington Hampshire, MD  furosemide (LASIX) 20 MG tablet Take 20 mg by mouth.    [provider]  gabapentin (NEURONTIN) 100 MG capsule Take 100 mg by mouth 3 (three) times daily.    [provider]  gemfibrozil (LOPID) 600 MG tablet Take 600 mg by mouth 2 (two) times daily before a meal.    [provider]  hydrochlorothiazide (HYDRODIURIL) 12.5 MG tablet TAKE 1 TABLET BY MOUTH DAILY. 11/18/16   Leone Haven, MD   lamoTRIgine (LAMICTAL) 25 MG tablet Take 50 mg by mouth daily. 01/12/20   [provider]  meloxicam (MOBIC) 15 MG tablet Take 15 mg by mouth as needed for pain.    [provider]  metoprolol succinate (TOPROL-XL) 25 MG 24 hr tablet Take 1 tablet (25 mg total) by mouth daily. Patient taking differently: Take 50 mg by mouth daily. 04/08/16   Leone Haven, MD  metoprolol tartrate (LOPRESSOR) 50 MG tablet Take 50 mg by mouth 2 (two) times daily.    [provider]  mupirocin ointment (BACTROBAN) 2 % Apply 1 application topically daily. "cyst in panty line"    [provider]  nystatin (MYCOSTATIN) 100000 UNIT/ML suspension Use as directed 5 mLs (500,000 Units total) in the mouth or throat 4 (four) times daily as needed (Oral irritation or Oral thrush). 06/07/20   Scot Jun, FNP  pantoprazole (PROTONIX) 20 MG tablet Take 20 mg by mouth daily. 02/17/20   [provider]  rosuvastatin (CRESTOR) 40 MG tablet Take 40 mg by mouth daily.    [provider]  sertraline (ZOLOFT) 100 MG tablet Take 100 mg by mouth daily.     [provider]    Allergies    Ezetimibe and Prednisone  Review of Systems   Review of Systems  Constitutional:  Negative for chills, fatigue and fever.  HENT:  Negative for sore throat, trouble swallowing and voice change.   Respiratory:  Negative for cough, choking and shortness of breath.   Cardiovascular:  Negative for chest pain.  Gastrointestinal:  Negative for abdominal distention, abdominal pain, anal bleeding, blood in stool, constipation, diarrhea, nausea, rectal pain and vomiting.  Musculoskeletal:  Negative for back pain.  Psychiatric/Behavioral:  Negative for confusion and decreased concentration.   All other systems reviewed and are negative.  Physical Exam Updated Vital Signs BP (!) 168/81   Pulse (!) 46   Temp 97.7 F (36.5 C) (Oral)   Resp 16   Ht '5\' 7"'$  (1.702 m)   Wt 95.3 kg    SpO2 100%   BMI 32.89 kg/m   Physical Exam Vitals and nursing note reviewed.  Constitutional:      General: She is not in acute distress.    Appearance: Normal appearance. She is normal weight. She is not ill-appearing, toxic-appearing or diaphoretic.  HENT:     Head: Normocephalic and atraumatic.  Cardiovascular:     Rate and Rhythm: Normal rate.  Pulmonary:     Effort: Pulmonary effort is normal. No respiratory distress.  Abdominal:     General: Abdomen is flat. Bowel sounds are normal. There is no distension.  Palpations: Abdomen is soft. There is no mass.     Tenderness: There is no abdominal tenderness. There is no guarding or rebound.  Musculoskeletal:        General: Normal range of motion.     Cervical back: Normal range of motion.  Skin:    General: Skin is warm and dry.  Neurological:     General: No focal deficit present.     Mental Status: She is alert.  Psychiatric:        Mood and Affect: Mood normal.        Behavior: Behavior normal.    ED Results / Procedures / Treatments   Labs (all labs ordered are listed, but only abnormal results are displayed) Labs Reviewed - No data to display  EKG None  Radiology DG Abdomen 1 View  Result Date: 11/11/2020 CLINICAL DATA:  Patient swallowed battery 3 days ago. EXAM: ABDOMEN - 1 VIEW COMPARISON:  None. FINDINGS: Mild fecal loading in the ascending colon. No bowel obstruction. No foreign body identified. No other abnormalities. IMPRESSION: Mild fecal loading in the ascending colon. No retained foreign body/battery identified. Electronically Signed   By: Dorise Bullion III M.D.   On: 11/11/2020 16:53    Procedures Procedures   Medications Ordered in ED Medications - No data to display  ED Course  I have reviewed the triage vital signs and the nursing notes.  Pertinent labs & imaging results that were available during my care of the patient were reviewed by me and considered in my medical decision making (see  chart for details).    MDM Rules/Calculators/A&P                         Patient presents for foreign body ingestion.  States she consumed a large battery that is approximately 2 cm in diameter 4 days ago. She has been checking her stools without location of the battery which prompted todays visit. Patient is asymptomatic. Imaging does not reveal foreign body at this time.   Low suspicion that foreign body could still be present considering negative imaging and asymptomatic patient after 4 days. Plan to discharge patient with follow-up if symptoms develop. Patient expresses understanding with plan of discharge and educated on red flag symptoms that would prompt return.   Final Clinical Impression(s) / ED Diagnoses Final diagnoses:  Swallowed foreign body, initial encounter    Rx / DC Orders ED Discharge Orders     None     An After Visit Summary was printed and given to the patient.    Nestor Lewandowsky 11/11/20 1900    Hayden Rasmussen, MD 11/12/20 1028

## 2020-11-11 NOTE — Discharge Instructions (Signed)
X-ray of your stomach and intestines reveals no retained battery.  Return if stomach pain or bloody stools develop.

## 2020-11-11 NOTE — ED Notes (Signed)
Left before receiving printed D/C instructions, verbal instructions given per PA

## 2021-03-06 ENCOUNTER — Telehealth: Payer: Self-pay | Admitting: Hematology

## 2021-03-06 ENCOUNTER — Other Ambulatory Visit: Payer: Self-pay | Admitting: Hematology

## 2021-03-06 ENCOUNTER — Telehealth: Payer: Self-pay

## 2021-03-06 DIAGNOSIS — C50111 Malignant neoplasm of central portion of right female breast: Secondary | ICD-10-CM

## 2021-03-06 DIAGNOSIS — Z170 Estrogen receptor positive status [ER+]: Secondary | ICD-10-CM

## 2021-03-06 NOTE — Telephone Encounter (Signed)
Scheduled appt per 12/23 referral. Pt is aware of appt date and time. Pt requested to see Dr. Burr Medico. I scheduled her for next available appt with provider. Pt is aware to arrive 15 mins prior to appt time.

## 2021-03-06 NOTE — Telephone Encounter (Signed)
Spoke with pt via telephone.  Pt stated she's transferring care to Dr. Burr Medico and is currently being seen in [facility].  Pt is scheduled to see Dr. Burr Medico on 03/19/2021.  Pt stated she is due to receive her Lupron injection on 03/16/2021 and wanted to know would she be able to receive her Lupron injection on 03/19/2021.  Informed pt that Dr. Burr Medico will determine if the pt should continue with Lupron injections at that time and if so, the pt will be able to get the Lupron injection on that day.  Pt also stated that she was prescribed Exemestane by her previous provider but did not start taking it as directed by her provider d/t her being scared.  Pt stated she started taking the exemestane on 03/05/2021.  Pt stated she will need a prescription for this medication.  When viewing the pt's medication list it does not currently show the pt is taking this medication.  Anastrozole is showing as being ordered and the Exemestane is showing under external medications to be reconciled.  Informed pt that I would make Dr. Burr Medico aware of the pt's need for Lupron injection on 03/19/2021.

## 2021-03-12 ENCOUNTER — Encounter: Payer: Self-pay | Admitting: Hematology

## 2021-03-19 ENCOUNTER — Inpatient Hospital Stay: Payer: 59 | Attending: Hematology | Admitting: Hematology

## 2021-03-19 ENCOUNTER — Inpatient Hospital Stay: Payer: 59

## 2021-03-19 DIAGNOSIS — C50111 Malignant neoplasm of central portion of right female breast: Secondary | ICD-10-CM

## 2021-03-23 ENCOUNTER — Encounter: Payer: Self-pay | Admitting: Hematology

## 2021-03-23 ENCOUNTER — Ambulatory Visit
Admission: EM | Admit: 2021-03-23 | Discharge: 2021-03-23 | Disposition: A | Discharge status: Home / Self Care | Payer: 59 | Attending: Internal Medicine | Admitting: Internal Medicine

## 2021-03-23 ENCOUNTER — Other Ambulatory Visit: Payer: Self-pay

## 2021-03-23 DIAGNOSIS — L089 Local infection of the skin and subcutaneous tissue, unspecified: Secondary | ICD-10-CM

## 2021-03-23 MED ORDER — DOXYCYCLINE HYCLATE 100 MG PO CAPS: 100.0000 mg | ORAL_CAPSULE | Freq: Two times a day (BID) | ORAL | 0 refills | Status: DC

## 2021-03-23 NOTE — ED Triage Notes (Signed)
Pt c/o "little bump things on my arm" and abscess to LLE.

## 2021-03-23 NOTE — Discharge Instructions (Signed)
You have been prescribed an antibiotic to help treat possible skin infection.  Please follow-up with primary care and cancer doctor to make sure that is not a drug reaction.

## 2021-03-23 NOTE — ED Provider Notes (Signed)
[name]    CSN: [id number] Arrival date & time: 03/23/21  1726      History   Chief Complaint Chief Complaint  Patient presents with   Abscess    HPI Melissa Shepherd is a 51 y.o. female.   Patient presents with multiple bumpy lesions and blisters present throughout skin that has been present for several days.  Denies any change to the environment including lotions, soaps, detergents, etc.  Reports that her husband had MRSA skin infection a few weeks ago.  She also has a new part of her tattoo to left upper arm with surrounding pustular lesions so she is not sure if it is infection from tattoo.  Patient denies any fevers, body aches, chills.  She started a new cancer medication called exemestane approximately 2 weeks ago and is not sure if it is a reaction to this medication.   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    Patient Active Problem List   Diagnosis Date Noted   Malignant neoplasm of central portion of right breast in female, estrogen receptor positive ([place]) 03/06/2021   Cholelithiasis with acute on chronic cholecystitis without biliary obstruction 11/26/2016   Anxiety 08/18/2015   Hyperlipidemia 08/18/2015   Vitamin D deficiency 08/18/2015   IBS (irritable bowel syndrome) 05/01/2015   Lumbago 10/06/2014   Bilateral thoracic back pain 10/06/2014   Chest pain 07/09/2014   Leg swelling 07/09/2014   Essential hypertension 06/11/2014   Depression 06/11/2014   Tobacco use disorder 06/11/2014    Past Surgical History:  Procedure Laterality Date    [place]. 2003.   2   CHOLECYSTECTOMY N/A 11/26/2016   Procedure: LAPAROSCOPIC CHOLECYSTECTOMY WITH INTRAOPERATIVE CHOLANGIOGRAM;  Surgeon: Fanny Skates, MD;  Location: [place];  Service: General;  Laterality: N/A;   MASTECTOMY     WISDOM TOOTH EXTRACTION      OB History   No obstetric history on file.      Home Medications    Prior to Admission medications   Medication Sig Start Date End Date Taking? Authorizing Provider  doxycycline (VIBRAMYCIN) 100 MG capsule Take 1 capsule (100 mg total) by mouth 2 (two) times daily. 03/23/21  Yes Jessee Newnam, Michele Rockers, FNP  ALPRAZolam Duanne Moron) 1 MG tablet Take 0.5-1 mg by mouth at bedtime as needed for anxiety.    [provider]  amphetamine-dextroamphetamine (ADDERALL) 20 MG tablet Take by mouth. 12/23/17   [provider]  anastrozole (ARIMIDEX) 1 MG tablet Take 1 mg by mouth daily.    [provider]  Cholecalciferol (VITAMIN D-3) 5000 units TABS Take 1 tablet by mouth daily.    [provider]  furosemide (LASIX) 20 MG tablet Take 1 tablet (20 mg total) by mouth daily. 04/16/16 11/20/16  Wellington Hampshire, MD  furosemide (LASIX) 20 MG tablet Take 20 mg by mouth.    [provider]  gabapentin (NEURONTIN) 100 MG capsule Take 100 mg by mouth 3 (three)  times daily.    [provider]  gemfibrozil (LOPID) 600 MG tablet Take 600 mg by mouth 2 (two) times daily before a meal.    [provider]  hydrochlorothiazide (HYDRODIURIL) 12.5 MG tablet TAKE 1 TABLET BY MOUTH DAILY. 11/18/16   Leone Haven, MD  lamoTRIgine (LAMICTAL) 25 MG tablet Take 50 mg by mouth daily. 01/12/20   [provider]  meloxicam (MOBIC) 15 MG tablet Take 15 mg by mouth as needed for pain.    [provider]  metoprolol succinate (TOPROL-XL) 25 MG 24 hr tablet Take 1 tablet (25 mg total) by mouth daily. Patient taking differently: Take 50 mg by mouth daily. 04/08/16   Leone Haven, MD   metoprolol tartrate (LOPRESSOR) 50 MG tablet Take 50 mg by mouth 2 (two) times daily.    [provider]  mupirocin ointment (BACTROBAN) 2 % Apply 1 application topically daily. "cyst in panty line"    [provider]  nystatin (MYCOSTATIN) 100000 UNIT/ML suspension Use as directed 5 mLs (500,000 Units total) in the mouth or throat 4 (four) times daily as needed (Oral irritation or Oral thrush). 06/07/20   Scot Jun, FNP  pantoprazole (PROTONIX) 20 MG tablet Take 20 mg by mouth daily. 02/17/20   [provider]  rosuvastatin (CRESTOR) 40 MG tablet Take 40 mg by mouth daily.    [provider]  sertraline (ZOLOFT) 100 MG tablet Take 100 mg by mouth daily.     [provider]    Family History Family History  Problem Relation Age of Onset   Hypertension Mother    Cancer Maternal Grandmother        Breast   Cancer Sister        cervical    Social History Social History   Tobacco Use   Smoking status: Every Day    Packs/day: 1.00    Years: 31.00    Pack years: 31.00    Types: Cigarettes   Smokeless tobacco: Never  Vaping Use   Vaping Use: Never used  Substance Use Topics   Alcohol use: Yes    Alcohol/week: 2.0 standard drinks    Types: 2 Cans of beer per week   Drug use: No     Allergies   Ezetimibe and Prednisone   Review of Systems Review of Systems Per HPI  Physical Exam Triage Vital Signs ED Triage Vitals [03/23/21 1906]  Enc Vitals Group     BP 110/78     Pulse Rate 65     Resp 18     Temp 97.9 F (36.6 C)     Temp Source Oral     SpO2 98 %     Weight      Height      Head Circumference      Peak Flow      Pain Score 0     Pain Loc      Pain Edu?      Excl. in [place]?    No data found.  Updated Vital Signs BP 110/78 (BP Location: Left Arm)    Pulse 65    Temp 97.9 F (36.6 C) (Oral)    Resp 18    SpO2 98%   Visual Acuity Right Eye Distance:   Left Eye Distance:   Bilateral Distance:     Right Eye Near:   Left Eye Near:    Bilateral Near:     Physical Exam Constitutional:  General: She is not in acute distress.    Appearance: Normal appearance. She is not toxic-appearing or diaphoretic.  HENT:     Head: Normocephalic and atraumatic.  Eyes:     Extraocular Movements: Extraocular movements intact.     Conjunctiva/sclera: Conjunctivae normal.  Pulmonary:     Effort: Pulmonary effort is normal.  Skin:    General: Skin is warm and dry.     Comments: Patient has pustule/blister like lesions present to left upper arm surrounding upper part of new tattoo.  No drainage noted from these lesions.  She also has similar lesions located to left lower abdomen area.  No drainage noted from lesions as well.  She also has approximately 2.5 cm in diameter blisterlike lesion with surrounding erythema noted to left lateral lower extremity.  Neurovascular intact.  Neurological:     General: No focal deficit present.     Mental Status: She is alert and oriented to person, place, and time. Mental status is at baseline.  Psychiatric:        Mood and Affect: Mood normal.        Behavior: Behavior normal.        Thought Content: Thought content normal.        Judgment: Judgment normal.     UC Treatments / Results  Labs (all labs ordered are listed, but only abnormal results are displayed) Labs Reviewed - No data to display  EKG   Radiology No results found.  Procedures Procedures (including critical care time)  Medications Ordered in UC Medications - No data to display  Initial Impression / Assessment and Plan / UC Course  I have reviewed the triage vital signs and the nursing notes.  Pertinent labs & imaging results that were available during my care of the patient were reviewed by me and considered in my medical decision making (see chart for details).     Unable to determine exact etiology of patient's skin lesions.  Low suspicion for erythema multiforme.  There  is high suspicion for MRSA infection given patient's recent exposure and appearance of blisterlike lesions.  Will treat with doxycycline antibiotic.  Does not appear that patient is having exemestane med reaction, although this is possible.  Advised patient to follow-up with healthcare provider who prescribed this medication to ensure that her reaction is not currently present.  There is no fevers, body aches, chills and patient is nontoxic appearance so do not think the patient is in need of immediate medical attention in the hospital at this time.  Discussed return precautions.  Patient verbalized understanding and was agreeable with plan. Final Clinical Impressions(s) / UC Diagnoses   Final diagnoses:  Skin infection     Discharge Instructions      You have been prescribed an antibiotic to help treat possible skin infection.  Please follow-up with primary care and cancer doctor to make sure that is not a drug reaction.    ED Prescriptions     Medication Sig Dispense Auth. Provider   doxycycline (VIBRAMYCIN) 100 MG capsule Take 1 capsule (100 mg total) by mouth 2 (two) times daily. 20 capsule Teodora Medici, [place]      PDMP not reviewed this encounter.   Teodora Medici, [place] 03/23/21 2027

## 2021-03-26 ENCOUNTER — Telehealth: Payer: Self-pay | Admitting: Hematology

## 2021-03-26 NOTE — Telephone Encounter (Signed)
Pt no showed appt. Called pt to r/s, no answer and vm was full, unable to leave a msg. Will try to contact pt again tomorrow.

## 2021-03-27 ENCOUNTER — Telehealth: Payer: Self-pay | Admitting: Hematology and Oncology

## 2021-03-27 NOTE — Telephone Encounter (Signed)
R/s pt's missed new pt breast appt. R/s to next available appt. Pt is aware of new appt date and time.

## 2021-03-31 ENCOUNTER — Other Ambulatory Visit: Payer: Self-pay

## 2021-03-31 ENCOUNTER — Emergency Department (HOSPITAL_COMMUNITY): Payer: 59

## 2021-03-31 ENCOUNTER — Observation Stay (HOSPITAL_COMMUNITY)
Admission: EM | Admit: 2021-03-31 | Discharge: 2021-04-01 | Disposition: A | Discharge status: Home / Self Care | Payer: 59 | Attending: Family Medicine | Admitting: Family Medicine

## 2021-03-31 ENCOUNTER — Encounter (HOSPITAL_COMMUNITY): Payer: Self-pay | Admitting: Internal Medicine

## 2021-03-31 DIAGNOSIS — E785 Hyperlipidemia, unspecified: Secondary | ICD-10-CM | POA: Diagnosis not present

## 2021-03-31 DIAGNOSIS — F32A Depression, unspecified: Secondary | ICD-10-CM | POA: Diagnosis present

## 2021-03-31 DIAGNOSIS — F17210 Nicotine dependence, cigarettes, uncomplicated: Secondary | ICD-10-CM | POA: Insufficient documentation

## 2021-03-31 DIAGNOSIS — F419 Anxiety disorder, unspecified: Secondary | ICD-10-CM | POA: Diagnosis present

## 2021-03-31 DIAGNOSIS — L02416 Cutaneous abscess of left lower limb: Secondary | ICD-10-CM | POA: Diagnosis not present

## 2021-03-31 DIAGNOSIS — Z20822 Contact with and (suspected) exposure to covid-19: Secondary | ICD-10-CM | POA: Insufficient documentation

## 2021-03-31 DIAGNOSIS — Z79632 Long term (current) use of antitumor antibiotic: Secondary | ICD-10-CM | POA: Diagnosis not present

## 2021-03-31 DIAGNOSIS — F418 Other specified anxiety disorders: Secondary | ICD-10-CM | POA: Diagnosis not present

## 2021-03-31 DIAGNOSIS — I10 Essential (primary) hypertension: Secondary | ICD-10-CM | POA: Diagnosis not present

## 2021-03-31 DIAGNOSIS — Z170 Estrogen receptor positive status [ER+]: Secondary | ICD-10-CM

## 2021-03-31 DIAGNOSIS — Z79899 Other long term (current) drug therapy: Secondary | ICD-10-CM | POA: Insufficient documentation

## 2021-03-31 DIAGNOSIS — L03116 Cellulitis of left lower limb: Principal | ICD-10-CM | POA: Diagnosis present

## 2021-03-31 DIAGNOSIS — E782 Mixed hyperlipidemia: Secondary | ICD-10-CM | POA: Diagnosis present

## 2021-03-31 DIAGNOSIS — Z853 Personal history of malignant neoplasm of breast: Secondary | ICD-10-CM | POA: Insufficient documentation

## 2021-03-31 DIAGNOSIS — C50111 Malignant neoplasm of central portion of right female breast: Secondary | ICD-10-CM

## 2021-03-31 DIAGNOSIS — L089 Local infection of the skin and subcutaneous tissue, unspecified: Secondary | ICD-10-CM | POA: Diagnosis present

## 2021-03-31 LAB — URINALYSIS, ROUTINE W REFLEX MICROSCOPIC
Bilirubin Urine: NEGATIVE
Glucose, UA: NEGATIVE mg/dL
Hgb urine dipstick: NEGATIVE
Ketones, ur: NEGATIVE mg/dL
Leukocytes,Ua: NEGATIVE
Nitrite: NEGATIVE
Protein, ur: NEGATIVE mg/dL
Specific Gravity, Urine: <1.005 — ABNORMAL LOW (ref 1.005–1.030)
pH: 6 (ref 5.0–8.0)

## 2021-03-31 LAB — RESP PANEL BY RT-PCR (FLU A&B, COVID) ARPGX2
Influenza A by PCR: NEGATIVE
Influenza B by PCR: NEGATIVE
SARS Coronavirus 2 by RT PCR: NEGATIVE

## 2021-03-31 LAB — CBC WITH DIFFERENTIAL/PLATELET
Abs Immature Granulocytes: 0.03 10*3/uL (ref 0.00–0.07)
Basophils Absolute: 0.1 10*3/uL (ref 0.0–0.1)
Basophils Relative: 1 %
Eosinophils Absolute: 0.1 10*3/uL (ref 0.0–0.5)
Eosinophils Relative: 1 %
HCT: 42.3 % (ref 36.0–46.0)
Hemoglobin: 14.1 g/dL (ref 12.0–15.0)
Immature Granulocytes: 0 %
Lymphocytes Relative: 41 %
Lymphs Abs: 3.1 10*3/uL (ref 0.7–4.0)
MCH: 30.4 pg (ref 26.0–34.0)
MCHC: 33.3 g/dL (ref 30.0–36.0)
MCV: 91.2 fL (ref 80.0–100.0)
Monocytes Absolute: 0.5 10*3/uL (ref 0.1–1.0)
Monocytes Relative: 7 %
Neutro Abs: 3.8 10*3/uL (ref 1.7–7.7)
Neutrophils Relative %: 50 %
Platelets: 284 10*3/uL (ref 150–400)
RBC: 4.64 MIL/uL (ref 3.87–5.11)
RDW: 13.2 % (ref 11.5–15.5)
WBC: 7.5 10*3/uL (ref 4.0–10.5)
nRBC: 0 % (ref 0.0–0.2)

## 2021-03-31 LAB — BASIC METABOLIC PANEL
Anion gap: 8 (ref 5–15)
BUN: 15 mg/dL (ref 6–20)
CO2: 24 mmol/L (ref 22–32)
Calcium: 9.2 mg/dL (ref 8.9–10.3)
Chloride: 104 mmol/L (ref 98–111)
Creatinine, Ser: 0.6 mg/dL (ref 0.44–1.00)
GFR, Estimated: >60 mL/min (ref >60)
Glucose, Bld: 92 mg/dL (ref 70–99)
Potassium: 3.2 mmol/L — ABNORMAL LOW (ref 3.5–5.1)
Sodium: 136 mmol/L (ref 135–145)

## 2021-03-31 LAB — MRSA NEXT GEN BY PCR, NASAL: MRSA by PCR Next Gen: NOT DETECTED

## 2021-03-31 MED ORDER — ONDANSETRON HCL 4 MG PO TABS: 4.0000 mg | ORAL_TABLET | Freq: Four times a day (QID) | ORAL | Status: DC | PRN

## 2021-03-31 MED ORDER — VANCOMYCIN HCL 1500 MG/300ML IV SOLN
1500.0000 mg | Freq: Two times a day (BID) | INTRAVENOUS | Status: DC
  Administered 2021-04-01 (×2): 1500 mg via INTRAVENOUS
  Filled 2021-03-31 (×3): qty 300

## 2021-03-31 MED ORDER — ACETAMINOPHEN 325 MG PO TABS: 650.0000 mg | ORAL_TABLET | Freq: Four times a day (QID) | ORAL | Status: DC | PRN

## 2021-03-31 MED ORDER — ALPRAZOLAM 1 MG PO TABS
1.0000 mg | ORAL_TABLET | Freq: Three times a day (TID) | ORAL | Status: DC | PRN
  Administered 2021-04-01: 1 mg via ORAL
  Filled 2021-03-31: qty 1

## 2021-03-31 MED ORDER — GABAPENTIN 300 MG PO CAPS
300.0000 mg | ORAL_CAPSULE | Freq: Three times a day (TID) | ORAL | Status: DC
  Administered 2021-03-31 – 2021-04-01 (×3): 300 mg via ORAL
  Filled 2021-03-31 (×3): qty 1

## 2021-03-31 MED ORDER — COLESTIPOL HCL 1 G PO TABS
2.0000 g | ORAL_TABLET | Freq: Two times a day (BID) | ORAL | Status: DC
  Administered 2021-04-01 (×2): 2 g via ORAL
  Filled 2021-03-31 (×4): qty 2

## 2021-03-31 MED ORDER — ONDANSETRON HCL 4 MG/2ML IJ SOLN: 4.0000 mg | Freq: Four times a day (QID) | INTRAMUSCULAR | Status: DC | PRN

## 2021-03-31 MED ORDER — FOLIC ACID 1 MG PO TABS
1.0000 mg | ORAL_TABLET | Freq: Every day | ORAL | Status: DC
  Administered 2021-03-31: 1 mg via ORAL
  Filled 2021-03-31: qty 1

## 2021-03-31 MED ORDER — SENNOSIDES-DOCUSATE SODIUM 8.6-50 MG PO TABS: 1.0000 | ORAL_TABLET | Freq: Every evening | ORAL | Status: DC | PRN

## 2021-03-31 MED ORDER — ACETAMINOPHEN 650 MG RE SUPP: 650.0000 mg | Freq: Four times a day (QID) | RECTAL | Status: DC | PRN

## 2021-03-31 MED ORDER — ROSUVASTATIN CALCIUM 20 MG PO TABS
40.0000 mg | ORAL_TABLET | Freq: Every day | ORAL | Status: DC
  Administered 2021-03-31: 40 mg via ORAL
  Filled 2021-03-31: qty 2

## 2021-03-31 MED ORDER — MUPIROCIN 2 % EX OINT
1.0000 "application " | TOPICAL_OINTMENT | Freq: Every day | CUTANEOUS | Status: DC | PRN
  Filled 2021-03-31: qty 22

## 2021-03-31 MED ORDER — MELOXICAM 15 MG PO TABS
15.0000 mg | ORAL_TABLET | Freq: Every day | ORAL | Status: DC | PRN
  Filled 2021-03-31: qty 1

## 2021-03-31 MED ORDER — POTASSIUM CHLORIDE CRYS ER 20 MEQ PO TBCR: 20.0000 meq | EXTENDED_RELEASE_TABLET | Freq: Every day | ORAL | Status: DC

## 2021-03-31 MED ORDER — POTASSIUM CHLORIDE 20 MEQ PO PACK
40.0000 meq | PACK | Freq: Once | ORAL | Status: AC
  Administered 2021-03-31: 40 meq via ORAL
  Filled 2021-03-31: qty 2

## 2021-03-31 MED ORDER — VANCOMYCIN HCL IN DEXTROSE 1-5 GM/200ML-% IV SOLN
1000.0000 mg | Freq: Once | INTRAVENOUS | Status: AC
  Administered 2021-03-31: 1000 mg via INTRAVENOUS
  Filled 2021-03-31: qty 200

## 2021-03-31 MED ORDER — ENOXAPARIN SODIUM 40 MG/0.4ML IJ SOSY
40.0000 mg | PREFILLED_SYRINGE | INTRAMUSCULAR | Status: DC
  Filled 2021-03-31: qty 0.4

## 2021-03-31 MED ORDER — SERTRALINE HCL 100 MG PO TABS
100.0000 mg | ORAL_TABLET | Freq: Every day | ORAL | Status: DC
  Administered 2021-03-31: 100 mg via ORAL
  Filled 2021-03-31: qty 1

## 2021-03-31 MED ORDER — OYSTER SHELL CALCIUM/D3 500-5 MG-MCG PO TABS
ORAL_TABLET | Freq: Every day | ORAL | Status: DC
  Filled 2021-03-31 (×2): qty 1

## 2021-03-31 MED ORDER — GEMFIBROZIL 600 MG PO TABS
600.0000 mg | ORAL_TABLET | Freq: Two times a day (BID) | ORAL | Status: DC
  Administered 2021-04-01 (×2): 600 mg via ORAL
  Filled 2021-03-31 (×4): qty 1

## 2021-03-31 MED ORDER — VITAMIN D 25 MCG (1000 UNIT) PO TABS
5000.0000 [IU] | ORAL_TABLET | Freq: Every day | ORAL | Status: DC
  Administered 2021-03-31: 5000 [IU] via ORAL
  Filled 2021-03-31 (×3): qty 5

## 2021-03-31 NOTE — ED Provider Notes (Signed)
[facility] DEPT Provider Note   CSN: [id number] Arrival date & time: 03/31/21  1717     History  No chief complaint on file.   Melissa Shepherd is a 51 y.o. female with history of breast cancer who presents the emergency department with concern for skin infection.  Patient states that she went to urgent care 2 weeks ago due to "bumps on her legs and arm".  Patient states that the spots do itch, and she now has spots on her left lower abdomen.  She reports getting a new tattoo recently, and is concerned this may have been the source of infection.  Her husband was also being treated for MRSA infection about a month ago.  She was placed on doxycycline by the urgent care, and has finished this.  She called the on-call physician at the cancer center today with concern of her symptoms, and they directed her to the emergency department for admission for IV antibiotics.  HPI     Home Medications Prior to Admission medications   Medication Sig Start Date End Date Taking? Authorizing Provider  ALPRAZolam Duanne Moron) 1 MG tablet Take 1 mg by mouth 3 (three) times daily.   Yes [provider]  amLODipine (NORVASC) 10 MG tablet Take 10 mg by mouth daily. 03/16/21  Yes [provider]  amphetamine-dextroamphetamine (ADDERALL) 20 MG tablet Take 20 mg by mouth 2 (two) times daily. 12/23/17  Yes [provider]  ascorbic acid (VITAMIN C) 250 MG CHEW Chew 500 mg by mouth at bedtime.   Yes [provider]  CALCIUM PO Take 600 mg by mouth at bedtime.   Yes [provider]  Cholecalciferol (VITAMIN D-3) 125 MCG (5000 UT) TABS Take 2 tablets by mouth at bedtime.   Yes [provider]  colestipol (COLESTID) 1 g tablet Take 2 g by mouth 2 (two) times daily. 02/01/21  Yes [provider]  exemestane (AROMASIN) 25 MG tablet Take 25 mg by mouth every evening. 03/22/21  Yes [provider]  folic acid (FOLVITE) 1 MG tablet  Take 1 mg by mouth at bedtime. 12/29/20  Yes [provider]  furosemide (LASIX) 20 MG tablet Take 20 mg by mouth daily as needed for fluid or edema.   Yes [provider]  gabapentin (NEURONTIN) 300 MG capsule Take 300 mg by mouth 3 (three) times daily. 02/13/21  Yes [provider]  gemfibrozil (LOPID) 600 MG tablet Take 600 mg by mouth 2 (two) times daily before a meal.   Yes [provider]  hydrochlorothiazide (HYDRODIURIL) 12.5 MG tablet TAKE 1 TABLET BY MOUTH DAILY. 11/18/16  Yes Leone Haven, MD  meloxicam (MOBIC) 15 MG tablet Take 15 mg by mouth as needed for pain.   Yes [provider]  metoprolol succinate (TOPROL-XL) 100 MG 24 hr tablet Take 100 mg by mouth 2 (two) times daily. 03/12/21  Yes [provider]  mupirocin ointment (BACTROBAN) 2 % Apply 1 application topically daily as needed (cyst). "cyst in panty line"   Yes [provider]  nystatin (MYCOSTATIN) 100000 UNIT/ML suspension Use as directed 5 mLs (500,000 Units total) in the mouth or throat 4 (four) times daily as needed (Oral irritation or Oral thrush). 06/07/20  Yes Scot Jun, FNP  potassium chloride SA (KLOR-CON M) 20 MEQ tablet Take 20 mEq by mouth at bedtime. 03/09/21  Yes [provider]  rosuvastatin (CRESTOR) 40 MG tablet Take 40 mg by mouth at bedtime.  Yes [provider]  sertraline (ZOLOFT) 100 MG tablet Take 100 mg by mouth at bedtime.   Yes [provider]  furosemide (LASIX) 20 MG tablet Take 1 tablet (20 mg total) by mouth daily. 04/16/16 11/20/16  Wellington Hampshire, MD      Allergies    Ezetimibe and Prednisone    Review of Systems   Review of Systems  Constitutional:  Negative for chills and fever.  Skin:  Positive for rash and wound.  All other systems reviewed and are negative.  Physical Exam Updated Vital Signs BP 112/64 (BP Location: Left Arm)    Pulse (!) 56    Temp 97.8 F (36.6 C) (Oral)    Resp  17    Ht 5\' 7"  (1.702 m)    Wt 86.2 kg    SpO2 98%    BMI 29.76 kg/m  Physical Exam Vitals and nursing note reviewed.  Constitutional:      Appearance: Normal appearance.  HENT:     Head: Normocephalic and atraumatic.  Eyes:     Conjunctiva/sclera: Conjunctivae normal.  Pulmonary:     Effort: Pulmonary effort is normal. No respiratory distress.  Skin:    General: Skin is warm and dry.     Comments: Multiple erythematous maculopapular lesions on the left arm and left hip.  There is 1 larger approximately 2.5 cm lesion with yellow and green purulent drainage from left anterior shin, with overlying erythema.  Neurological:     Mental Status: She is alert.  Psychiatric:        Mood and Affect: Mood normal.        Behavior: Behavior normal.    ED Results / Procedures / Treatments   Labs (all labs ordered are listed, but only abnormal results are displayed) Labs Reviewed  BASIC METABOLIC PANEL - Abnormal; Notable for the following components:      Result Value   Potassium 3.2 (*)    All other components within normal limits  URINALYSIS, ROUTINE W REFLEX MICROSCOPIC - Abnormal; Notable for the following components:   Color, Urine YELLOW (*)    APPearance CLEAR (*)    Specific Gravity, Urine <1.005 (*)    Bacteria, UA RARE (*)    All other components within normal limits  MRSA NEXT GEN BY PCR, NASAL  RESP PANEL BY RT-PCR (FLU A&B, COVID) ARPGX2  CULTURE, BLOOD (SINGLE)  AEROBIC CULTURE W GRAM STAIN (SUPERFICIAL SPECIMEN)  CBC WITH DIFFERENTIAL/PLATELET  HIV ANTIBODY (ROUTINE TESTING W REFLEX)  CBC  BASIC METABOLIC PANEL    EKG None  Radiology DG Tibia/Fibula Left  Result Date: 03/31/2021 CLINICAL DATA:  Wound on the lateral calf without healing. EXAM: LEFT TIBIA AND FIBULA - 2 VIEW COMPARISON:  None. FINDINGS: Degenerative changes in the left knee and ankle joints. No evidence of acute fracture or dislocation. No focal bone lesion or cortical destruction. No lucencies to  suggest osteomyelitis. Soft tissues are unremarkable. No soft tissue gas or radiopaque foreign body identified. IMPRESSION: Mild degenerative changes in the knee and ankle joints. No acute bony abnormalities. Electronically Signed   By: Lucienne Capers M.D.   On: 03/31/2021 19:09    Procedures Procedures    Medications Ordered in ED Medications  enoxaparin (LOVENOX) injection 40 mg (has no administration in time range)  potassium chloride (KLOR-CON) packet 40 mEq (has no administration in time range)  acetaminophen (TYLENOL) tablet 650 mg (has no administration in time range)    Or  acetaminophen (TYLENOL) suppository  650 mg (has no administration in time range)  ondansetron (ZOFRAN) tablet 4 mg (has no administration in time range)    Or  ondansetron (ZOFRAN) injection 4 mg (has no administration in time range)  senna-docusate (Senokot-S) tablet 1 tablet (has no administration in time range)  vancomycin (VANCOREADY) IVPB 1500 mg/300 mL (has no administration in time range)  ALPRAZolam (XANAX) tablet 1 mg (has no administration in time range)  calcium-vitamin D (OSCAL WITH D) 500-5 MG-MCG per tablet (has no administration in time range)  cholecalciferol (VITAMIN D) tablet 5,000 Units (has no administration in time range)  colestipol (COLESTID) tablet 2 g (has no administration in time range)  folic acid (FOLVITE) tablet 1 mg (has no administration in time range)  gemfibrozil (LOPID) tablet 600 mg (has no administration in time range)  gabapentin (NEURONTIN) capsule 300 mg (has no administration in time range)  meloxicam (MOBIC) tablet 15 mg (has no administration in time range)  mupirocin ointment (BACTROBAN) 2 % 1 application (has no administration in time range)  potassium chloride SA (KLOR-CON M) CR tablet 20 mEq (has no administration in time range)  rosuvastatin (CRESTOR) tablet 40 mg (has no administration in time range)  sertraline (ZOLOFT) tablet 100 mg (has no administration in  time range)  vancomycin (VANCOCIN) IVPB 1000 mg/200 mL premix (1,000 mg Intravenous New Bag/Given 03/31/21 2027)    ED Course/ Medical Decision Making/ A&P                           Medical Decision Making Risk Prescription drug management. Decision regarding hospitalization.   Patient is a 51 year old female with history of breast cancer currently on oral chemotherapy who presents the emergency department complaining of concern for skin infection.  Patient was placed on a course of doxycycline for likely cellulitis.  She finished this but continued to have persistence of her symptoms.  She contacted the on-call physician at the cancer center today who directed to the emergency department for IV antibiotics. She denies fevers or chills. Patient's husband was being treated one month ago for MRSA.   On exam patient has multiple lesions across her left arm and left hip, as well as one larger lesion on the left anterior shin with yellow/green purulent drainage. This appears to be an abscess that has opened on its own. She is afebrile, not tachycardic, not tachypneic, and in no acute distress.   Labs: I personally ordered and interpreted the patient's labs, and the pertinent findings include: No leukocytosis, electrolytes grossly within normal limits.  Imaging: I personally ordered and interpreted the patient's imaging to include left tibia/fibula x-ray which showed no findings of osteomyelitis.   Medications: I ordered IV antibiotics for this patient to include vancomycin, normal dosing as patient does not have decreased renal function.  Consultations/Disposition: I obtain consultation with hospitalist Dr. Posey Pronto, who agreed to admit patient for observation and IV antibiotics. The patient appears reasonably stabilized for admission considering the current resources, flow, and capabilities available in the ED at this time, and I doubt any other [facility] requiring further screening and/or treatment in  the ED prior to admission.  Final Clinical Impression(s) / ED Diagnoses Final diagnoses:  Cellulitis of left lower extremity    Rx / DC Orders ED Discharge Orders     None      Portions of this report may have been transcribed using voice recognition software. Every effort was made to ensure accuracy; however, inadvertent computerized  transcription errors may be present.    Estill Cotta 03/31/21 2316    Daleen Bo, MD 04/01/21 [phone]

## 2021-03-31 NOTE — Assessment & Plan Note (Signed)
S/p right mastectomy with reconstruction 01/2020 on active therapy with exemestane and monthly Lupron.  Previously following with [facility] but transitioning to [facility] per patient.

## 2021-03-31 NOTE — H&P (Signed)
History and Physical    [name] XBW:[id number] DOB: Jun 23, 1970 DOA: 03/31/2021  PCP: Remi Haggard, FNP  Patient coming from: Home  I have personally briefly reviewed patient's old medical records in [place]  Chief Complaint: Skin infection  HPI: Melissa Shepherd is a 51 y.o. female with medical history significant for hypertension, hyperlipidemia, depression/anxiety, chronic venous insufficiency, right-sided breast cancer (s/p mastectomy and reconstruction) who presented to the ED for evaluation of nonhealing skin infection.  Patient initially went to urgent care 03/23/2021 for evaluation of rash that had been ongoing for several days.  She did report recent initiation of exemestane and also that her husband had a MRSA skin infection recently.  She was noted to have new tattoo to her left upper arm with surrounding pustular lesions without drainage.  Similar lesion seen at the left lower abdomen without drainage.  A 2.5 cm blisterlike lesion with surrounding erythema was noted to the left lateral lower extremity.  Patient was hemodynamically stable.  There was concern for MRSA skin infection.  She was started on a course of oral antibiotics with doxycycline.  Patient states that she has been taking doxycycline as prescribed.  However the area of infection on her left shin seem to be worsening and this morning opened up with purulent yellow/green discharge.  She called her oncologist who recommended she come to the ED for IV antibiotics given her immunosuppressed status.  Patient otherwise denies any subjective fevers, chills, diaphoresis, chest pain, dyspnea, abdominal pain.  ED Course   Labs/Imaging on admission: I have personally reviewed following labs and imaging studies.  Initial vitals showed BP 106/61, pulse 61, RR 18, temp 98.2 F, SPO2 100% on room air.  Labs show WBC 7.5, hemoglobin 14.1, platelets 284,000, Sodium 136, potassium 3.2, bicarb 24, BUN 15, creatinine 0.60,  serum glucose 92.  Single blood culture and wound culture collected and pending.  Left tibia/fibula x-ray negative for acute bony abnormalities.  Mild degenerative changes in the knee and ankle joints noted.  Patient was given IV vancomycin and the hospitalist service was consulted to admit for management of left lower extremity cellulitis failing outpatient management.  Review of Systems: All systems reviewed and are negative except as documented in history of present illness above.   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 2003.   2   CHOLECYSTECTOMY N/A 11/26/2016   Procedure: LAPAROSCOPIC CHOLECYSTECTOMY WITH INTRAOPERATIVE CHOLANGIOGRAM;  Surgeon: Fanny Skates, MD;  Location: [facility];  Service: General;  Laterality: N/A;   MASTECTOMY     WISDOM TOOTH EXTRACTION      Social History:  reports that she has been smoking cigarettes. She has a 31.00 pack-year smoking history. She has never used smokeless tobacco. She reports current alcohol use of about 2.0 standard drinks per week. She reports that she does not use drugs.  Allergies  Allergen Reactions  Ezetimibe Other (See Comments)    Patient does not report what actually happens with Zetia Patient does not report what actually happens with Zetia    Prednisone     Family History  Problem Relation Age of Onset   Hypertension Mother    Cancer Maternal Grandmother        Breast   Cancer Sister        cervical     Prior to Admission medications    Medication Sig Start Date End Date Taking? Authorizing Provider  ALPRAZolam Duanne Moron) 1 MG tablet Take 0.5-1 mg by mouth at bedtime as needed for anxiety.    [provider]  amphetamine-dextroamphetamine (ADDERALL) 20 MG tablet Take by mouth. 12/23/17   [provider]  anastrozole (ARIMIDEX) 1 MG tablet Take 1 mg by mouth daily.    [provider]  Cholecalciferol (VITAMIN D-3) 5000 units TABS Take 1 tablet by mouth daily.    [provider]  doxycycline (VIBRAMYCIN) 100 MG capsule Take 1 capsule (100 mg total) by mouth 2 (two) times daily. 03/23/21   Teodora Medici, FNP  furosemide (LASIX) 20 MG tablet Take 1 tablet (20 mg total) by mouth daily. 04/16/16 11/20/16  Wellington Hampshire, MD  furosemide (LASIX) 20 MG tablet Take 20 mg by mouth.    [provider]  gabapentin (NEURONTIN) 100 MG capsule Take 100 mg by mouth 3 (three) times daily.    [provider]  gemfibrozil (LOPID) 600 MG tablet Take 600 mg by mouth 2 (two) times daily before a meal.    [provider]  hydrochlorothiazide (HYDRODIURIL) 12.5 MG tablet TAKE 1 TABLET BY MOUTH DAILY. 11/18/16   Leone Haven, MD  lamoTRIgine (LAMICTAL) 25 MG tablet Take 50 mg by mouth daily. 01/12/20   [provider]  meloxicam (MOBIC) 15 MG tablet Take 15 mg by mouth as needed for pain.    [provider]  metoprolol succinate (TOPROL-XL) 25 MG 24 hr tablet Take 1 tablet (25 mg total) by mouth daily. Patient taking differently: Take 50 mg by mouth daily. 04/08/16   Leone Haven, MD  metoprolol tartrate (LOPRESSOR) 50 MG tablet Take 50 mg by mouth 2 (two) times daily.    [provider]  mupirocin ointment (BACTROBAN) 2 % Apply 1 application topically daily. "cyst in panty line"    [provider]  nystatin (MYCOSTATIN) 100000 UNIT/ML suspension Use as directed 5 mLs (500,000 Units total) in the mouth or throat 4 (four) times daily as needed (Oral  irritation or Oral thrush). 06/07/20   Scot Jun, FNP  pantoprazole (PROTONIX) 20 MG tablet Take 20 mg by mouth daily. 02/17/20   [provider]  rosuvastatin (CRESTOR) 40 MG tablet Take 40 mg by mouth daily.    [provider]  sertraline (ZOLOFT) 100 MG tablet Take 100 mg by mouth daily.     [provider]    Physical Exam: Vitals:   03/31/21 1731 03/31/21 1855  BP: 106/61 91/63  Pulse: 61 (!) 57  Resp: 18 18  Temp: 98.2 F (36.8 C)   TempSrc: Oral   SpO2: 100% 100%  Weight: 86.2 kg   Height: 5\' 7"  (1.702 m)    Constitutional: Resting in bed, NAD, calm, comfortable Eyes: PERRL, lids and conjunctivae normal ENMT: Mucous membranes are moist. Posterior pharynx clear of any exudate or lesions.Normal dentition.  Neck: normal, supple, no masses. Respiratory: clear to auscultation bilaterally, no wheezing, no crackles. Normal  respiratory effort. No accessory muscle use.  Cardiovascular: Regular rate and rhythm, no murmurs / rubs / gallops. No extremity edema. 2+ pedal pulses. Abdomen: no tenderness, no masses palpated. No hepatosplenomegaly. Bowel sounds positive.  Musculoskeletal: no clubbing / cyanosis. No joint deformity upper and lower extremities. Good ROM, no contractures. Normal muscle tone.  Skin: Approximately 2 and half centimeter circumferential open abscess with purulent green/yellow discharge present on left lower anterior leg with small area of surrounding erythema.  Scattered small pustular lesions noted left lower abdomen in the healing stages, without active discharge. Neurologic: CN 2-12 grossly intact. Sensation intact. Strength 5/5 in all 4.  Psychiatric: Normal judgment and insight. Alert and oriented x 3. Normal mood.   EKG: Not performed.  Assessment/Plan Principal Problem:   Cellulitis and abscess of left lower extremity Active Problems:   Essential hypertension   Malignant neoplasm of central portion of right breast in  female, estrogen receptor positive ([place])   Depression   Anxiety   Hyperlipidemia   Laney Louderback is a 51 y.o. female with medical history significant for hypertension, hyperlipidemia, depression/anxiety, chronic venous insufficiency, right-sided breast cancer (s/p mastectomy and reconstruction) who is admitted for left lower extremity cellulitis/abscess failing outpatient antibiotics.  Assessment and Plan: * Cellulitis and abscess of left lower extremity- (present on admission) Initially seen in urgent care 1/27 and prescribed 10-day course of doxycycline without improvement of LLE anterior shin abscess with cellulitis.  Abscess has since opened up and is draining purulent discharge.  Also has some scattered pustular lesions left lower abdomen and left upper arm which appear to be in healing phases. -Start on IV vancomycin -Can potentially transition to oral antibiotics and DC to home in next 24 hours -Follow blood and wound culture  Essential hypertension- (present on admission) Normotensive to borderline hypotensive on admission. 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  Previously following with [place] [facility]ology but transitioning to [facility] per patient.  Hyperlipidemia- (present on admission) Continue rosuvastatin and Lopid.  Anxiety- (present on admission) Continue home Xanax 1 mg 3 times daily as needed.  Depression- (present on admission) Continue sertraline 100 mg daily.  DVT prophylaxis: enoxaparin (LOVENOX) injection 40 mg Start: 03/31/21 2200 Code Status: Full code, confirmed with patient on admission  Family Communication: Discussed with patient, she has discussed with family  Disposition Plan: From home and likely discharge to home in 1-2  days Consults called: None  Severity of Illness: The appropriate patient status for this patient is OBSERVATION. Observation status is judged to be reasonable and necessary in order to provide the required intensity of service to ensure the patient's safety. The patient's presenting symptoms, physical exam findings, and initial radiographic and laboratory data in the context of their medical condition is felt to place them at decreased risk for further clinical deterioration. Furthermore, it is anticipated that the patient will be medically stable for discharge from the hospital within 2 midnights of admission.   Zada Finders MD [facility]  If 7PM-7AM, please contact night-coverage [email or web address]  03/31/2021, 8:57 PM

## 2021-03-31 NOTE — Assessment & Plan Note (Signed)
Continue sertraline.  100 mg daily  

## 2021-03-31 NOTE — Assessment & Plan Note (Signed)
Normotensive to borderline hypotensive on admission. -Hold home metoprolol, HCTZ, amlodipine for now

## 2021-03-31 NOTE — ED Notes (Signed)
On assessment, it was noted that pt. Had an IV in the RFA. Pt. Had a mastectomy on that side as well. IV was removed. Pt. Stated that she for got to mention that she had the mastectomy when she got the IV.

## 2021-03-31 NOTE — Progress Notes (Signed)
Pharmacy Antibiotic Note  Melissa Shepherd is a 51 y.o. female admitted on 03/31/2021 with cellulitis.  Pharmacy has been consulted for Vancomycin dosing. Started Doxycycline as outpt 1/27, sent to ED by on call MD [facility].  Plan: Vancomycin 1gm in ED, then 1500mg  q12 for AUC 503  Height: 5\' 7"  (170.2 cm) Weight: 86.2 kg (190 lb) IBW/kg (Calculated) : 61.6  Temp (24hrs), Avg:98.2 F (36.8 C), Min:98.2 F (36.8 C), Max:98.2 F (36.8 C)  Recent Labs  Lab 03/31/21 1825  WBC 7.5  CREATININE 0.60    Estimated Creatinine Clearance: 95.9 mL/min (by C-G formula based on SCr of 0.6 mg/dL).    Allergies  Allergen Reactions   Ezetimibe Other (See Comments)    Patient does not report what actually happens with Zetia Patient does not report what actually happens with Zetia    Prednisone     Antimicrobials this admission: 2/4 Vancomycin >>   Dose adjustments this admission:  Microbiology results: 2/4 Wound Cx: sent  Thank you for allowing pharmacy to be a part of this patients care.  Minda Ditto PharmD 03/31/2021 8:26 PM

## 2021-03-31 NOTE — Assessment & Plan Note (Signed)
Continue rosuvastatin and Lopid.

## 2021-03-31 NOTE — ED Triage Notes (Signed)
Patient reports she is cancer patient , says she went to [facility] two fridays again due to bumps on legs and arm.  Patient states she is not in pain right now. Says it does itch. Has spot to left lower leg, spots around left lower abdomen, left AC area. Patient says she was put on doxycycline. Patient reports on call MD at cancer center directed patient to come to ED for IV antibiotics.

## 2021-03-31 NOTE — Hospital Course (Signed)
Melissa Shepherd is a 51 y.o. female with medical history significant for hypertension, hyperlipidemia, depression/anxiety, chronic venous insufficiency, right-sided breast cancer (s/p mastectomy and reconstruction) who is admitted for left lower extremity cellulitis/abscess failing outpatient antibiotics.

## 2021-03-31 NOTE — Assessment & Plan Note (Signed)
Initially seen in urgent care 1/27 and prescribed 10-day course of doxycycline without improvement of LLE anterior shin abscess with cellulitis.  Abscess has since opened up and is draining purulent discharge.  Also has some scattered pustular lesions left lower abdomen and left upper arm which appear to be in healing phases. -Start on IV vancomycin -Can potentially transition to oral antibiotics and DC to home in next 24 hours -Follow blood and wound culture

## 2021-03-31 NOTE — Assessment & Plan Note (Signed)
Continue home Xanax 1 mg 3 times daily as needed.

## 2021-03-31 NOTE — Plan of Care (Signed)
POC initiated 

## 2021-04-01 DIAGNOSIS — I10 Essential (primary) hypertension: Secondary | ICD-10-CM | POA: Diagnosis not present

## 2021-04-01 DIAGNOSIS — L03116 Cellulitis of left lower limb: Secondary | ICD-10-CM | POA: Diagnosis not present

## 2021-04-01 LAB — CBC
HCT: 40.8 % (ref 36.0–46.0)
Hemoglobin: 13.4 g/dL (ref 12.0–15.0)
MCH: 30.4 pg (ref 26.0–34.0)
MCHC: 32.8 g/dL (ref 30.0–36.0)
MCV: 92.5 fL (ref 80.0–100.0)
Platelets: 239 10*3/uL (ref 150–400)
RBC: 4.41 MIL/uL (ref 3.87–5.11)
RDW: 13.2 % (ref 11.5–15.5)
WBC: 5.7 10*3/uL (ref 4.0–10.5)
nRBC: 0 % (ref 0.0–0.2)

## 2021-04-01 LAB — BASIC METABOLIC PANEL
Anion gap: 7 (ref 5–15)
BUN: 16 mg/dL (ref 6–20)
CO2: 28 mmol/L (ref 22–32)
Calcium: 9.6 mg/dL (ref 8.9–10.3)
Chloride: 105 mmol/L (ref 98–111)
Creatinine, Ser: 0.71 mg/dL (ref 0.44–1.00)
GFR, Estimated: >60 mL/min (ref >60)
Glucose, Bld: 98 mg/dL (ref 70–99)
Potassium: 3.6 mmol/L (ref 3.5–5.1)
Sodium: 140 mmol/L (ref 135–145)

## 2021-04-01 LAB — HIV ANTIBODY (ROUTINE TESTING W REFLEX): HIV Screen 4th Generation wRfx: NONREACTIVE

## 2021-04-01 MED ORDER — SULFAMETHOXAZOLE-TRIMETHOPRIM 800-160 MG PO TABS: 1.0000 | ORAL_TABLET | Freq: Two times a day (BID) | ORAL | 0 refills | Status: AC

## 2021-04-01 NOTE — TOC CM/SW Note (Signed)
°  Transition of Care [facility]) Screening Note   Patient Details  Name: Dim Meisinger Date of Birth: [birth year]   Transition of Care [facility]) CM/SW Contact:    Ross Ludwig, LCSW Phone Number: 04/01/2021, 11:35 AM    Transition of Care Department [facility]) has reviewed patient and no TOC needs have been identified at this time. We will continue to monitor patient advancement through interdisciplinary progression rounds. If new patient transition needs arise, please place a TOC consult.

## 2021-04-01 NOTE — Plan of Care (Signed)
  Problem: Education: Goal: Knowledge of General Education information will improve Description Including pain rating scale, medication(s)/side effects and non-pharmacologic comfort measures Outcome: Progressing   Problem: Health Behavior/Discharge Planning: Goal: Ability to manage health-related needs will improve Outcome: Progressing   

## 2021-04-01 NOTE — Discharge Summary (Signed)
Physician Discharge Summary   Patient: Melissa Shepherd MRN: [id number] DOB: 1970/06/10  Admit date:     03/31/2021  Discharge date: 04/01/21  Discharge Physician: Oswald Hillock   PCP: Remi Haggard, FNP   Recommendations at discharge:   Follow blood culture results and abscess culture results with PCP in 1 week Start taking Bactrim DS 1 tablet p.o. twice daily for 5 days  Discharge Diagnoses: Principal Problem:   Cellulitis and abscess of left lower extremity Active Problems:   Essential hypertension   Depression   Anxiety   Hyperlipidemia   Malignant neoplasm of central portion of right breast in female, estrogen receptor positive ([place])  Resolved Problems:   * No resolved hospital problems. *   Hospital Course: Melissa Shepherd is a 51 y.o. female with medical history significant for hypertension, hyperlipidemia, depression/anxiety, chronic venous insufficiency, right-sided breast cancer (s/p mastectomy and reconstruction) who is admitted for left lower extremity cellulitis/abscess failing outpatient antibiotics.  Assessment and Plan: * Cellulitis and abscess of left lower extremity- (present on admission) Initially seen in urgent care 1/27 and prescribed 10-day course of doxycycline without improvement of LLE anterior shin abscess with cellulitis.  Abscess has since opened up and is draining purulent discharge.  Also has some scattered pustular lesions left lower abdomen and left upper arm which appear to be in healing phases. -Started on IV vancomycin -Significantly improved.  All the lesions have dried up. -We will discharge her on Bactrim DS 1 tablet p.o. twice daily for 5 more days -Follow-up PCP in 1 week to get blood and abscess culture results as outpatient  Malignant neoplasm of central portion of right breast in female, estrogen receptor positive ([place]) S/p right mastectomy with reconstruction 01/2020 on active therapy with exemestane and monthly Lupron.  Previously  following with [place] [facility] but transitioning to [facility] per patient.  Hyperlipidemia- (present on admission) Continue rosuvastatin and Lopid.  Anxiety- (present on admission) Continue home Xanax 1 mg 3 times daily as needed.  Depression- (present on admission) Continue sertraline 100 mg daily.  Essential hypertension- (present on admission) Normotensive to borderline hypotensive on admission. - metoprolol, HCTZ, amlodipine were held in the hospital -Continue taking home medications at discharge           Consultants:  Procedures performed:   Disposition: Home Diet recommendation:  Discharge Diet Orders (From admission, onward)     Start     Ordered   04/01/21 0000  Diet - low sodium heart healthy        04/01/21 1035           Regular diet  DISCHARGE MEDICATION: Allergies as of 04/01/2021       Reactions   Ezetimibe Other (See Comments)   Patient does not report what actually happens with Zetia Patient does not report what actually happens with Zetia   Prednisone         Medication List     TAKE these medications    ALPRAZolam 1 MG tablet Commonly known as: XANAX Take 1 mg by mouth 3 (three) times daily.   amLODipine 10 MG tablet Commonly known as: NORVASC Take 10 mg by mouth daily.   amphetamine-dextroamphetamine 20 MG tablet Commonly known as: ADDERALL Take 20 mg by mouth 2 (two) times daily.   ascorbic acid 250 MG Chew Commonly known as: VITAMIN C Chew 500 mg by mouth at bedtime.   CALCIUM PO Take 600 mg by mouth at bedtime.  colestipol 1 g tablet Commonly known as: COLESTID Take 2 g by mouth 2 (two) times daily.   exemestane 25 MG tablet Commonly known as: AROMASIN Take 25 mg by mouth every evening.   folic acid 1 MG tablet Commonly known as: FOLVITE Take 1 mg by mouth at bedtime.   furosemide 20 MG tablet Commonly known as: LASIX Take 20 mg by mouth daily as needed for fluid or edema.    furosemide 20 MG tablet Commonly known as: LASIX Take 1 tablet (20 mg total) by mouth daily.   gabapentin 300 MG capsule Commonly known as: NEURONTIN Take 300 mg by mouth 3 (three) times daily.   gemfibrozil 600 MG tablet Commonly known as: LOPID Take 600 mg by mouth 2 (two) times daily before a meal.   hydrochlorothiazide 12.5 MG tablet Commonly known as: HYDRODIURIL TAKE 1 TABLET BY MOUTH DAILY.   meloxicam 15 MG tablet Commonly known as: MOBIC Take 15 mg by mouth as needed for pain.   metoprolol succinate 100 MG 24 hr tablet Commonly known as: TOPROL-XL Take 100 mg by mouth 2 (two) times daily.   mupirocin ointment 2 % Commonly known as: BACTROBAN Apply 1 application topically daily as needed (cyst). "cyst in panty line"   nystatin 100000 UNIT/ML suspension Commonly known as: MYCOSTATIN Use as directed 5 mLs (500,000 Units total) in the mouth or throat 4 (four) times daily as needed (Oral irritation or Oral thrush).   potassium chloride SA 20 MEQ tablet Commonly known as: KLOR-CON M Take 20 mEq by mouth at bedtime.   rosuvastatin 40 MG tablet Commonly known as: CRESTOR Take 40 mg by mouth at bedtime.   sertraline 100 MG tablet Commonly known as: ZOLOFT Take 100 mg by mouth at bedtime.   sulfamethoxazole-trimethoprim 800-160 MG tablet Commonly known as: BACTRIM DS Take 1 tablet by mouth 2 (two) times daily for 5 days. Start taking on: April 02, 2021   Vitamin D-3 125 MCG (5000 UT) Tabs Take 2 tablets by mouth at bedtime.         Discharge Exam: Filed Weights   03/31/21 1731  Weight: 86.2 kg   General-appears in no acute distress Heart-S1-S2, regular, no murmur auscultated Lungs-clear to auscultation bilaterally, no wheezing or crackles auscultated Abdomen-soft, nontender, no organomegaly Extremities-no edema in the lower extremities Neuro-alert, oriented x3, no focal deficit noted  Condition at discharge: good  The results of significant  diagnostics from this hospitalization (including imaging, microbiology, ancillary and laboratory) are listed below for reference.   Imaging Studies: DG Tibia/Fibula Left  Result Date: 03/31/2021 CLINICAL DATA:  Wound on the lateral calf without healing. EXAM: LEFT TIBIA AND FIBULA - 2 VIEW COMPARISON:  None. FINDINGS: Degenerative changes in the left knee and ankle joints. No evidence of acute fracture or dislocation. No focal bone lesion or cortical destruction. No lucencies to suggest osteomyelitis. Soft tissues are unremarkable. No soft tissue gas or radiopaque foreign body identified. IMPRESSION: Mild degenerative changes in the knee and ankle joints. No acute bony abnormalities. Electronically Signed   By: Lucienne Capers M.D.   On: 03/31/2021 19:09    Microbiology: Results for orders placed or performed during the hospital encounter of 03/31/21  MRSA Next Gen by PCR, Nasal     Status: None   Collection Time: 03/31/21  6:25 PM   Specimen: Nasal Mucosa; Nasal Swab  Result Value Ref Range Status   MRSA by PCR Next Gen NOT DETECTED NOT DETECTED Final    Comment: (NOTE)  The GeneXpert MRSA Assay (FDA approved for NASAL specimens only), is one component of a comprehensive MRSA colonization surveillance program. It is not intended to diagnose MRSA infection nor to guide or monitor treatment for MRSA infections. Test performance is not FDA approved in patients less than 15 years old. Performed at [facility], [place] [address] [place], [place] [address]   Resp Panel by RT-PCR (Flu A&B, Covid) Nasopharyngeal Swab     Status: None   Collection Time: 03/31/21  9:22 PM   Specimen: Nasopharyngeal Swab; Nasopharyngeal(NP) swabs in vial transport medium  Result Value Ref Range Status   SARS Coronavirus 2 by RT PCR NEGATIVE NEGATIVE Final    Comment: (NOTE) SARS-CoV-2 target nucleic acids are NOT DETECTED.  The SARS-CoV-2 RNA is generally detectable in upper  respiratory specimens during the acute phase of infection. The lowest concentration of SARS-CoV-2 viral copies this assay can detect is 138 copies/mL. A negative result does not preclude SARS-Cov-2 infection and should not be used as the sole basis for treatment or other patient management decisions. A negative result may occur with  improper specimen collection/handling, submission of specimen other than nasopharyngeal swab, presence of viral mutation(s) within the areas targeted by this assay, and inadequate number of viral copies(<138 copies/mL). A negative result must be combined with clinical observations, patient history, and epidemiological information. The expected result is Negative.  Fact Sheet for Patients:  EntrepreneurPulse.com.au  Fact Sheet for Healthcare Providers:  IncredibleEmployment.be  This test is no t yet approved or cleared by the [place] FDA and  has been authorized for detection and/or diagnosis of SARS-CoV-2 by FDA under an Emergency Use Authorization (EUA). This EUA will remain  in effect (meaning this test can be used) for the duration of the COVID-19 declaration under Section 564(b)(1) of the Act, 21 U.S.C.section 360bbb-3(b)(1), unless the authorization is terminated  or revoked sooner.       Influenza A by PCR NEGATIVE NEGATIVE Final   Influenza B by PCR NEGATIVE NEGATIVE Final    Comment: (NOTE) The Xpert Xpress SARS-CoV-2/FLU/RSV plus assay is intended as an aid in the diagnosis of influenza from Nasopharyngeal swab specimens and should not be used as a sole basis for treatment. Nasal washings and aspirates are unacceptable for Xpert Xpress SARS-CoV-2/FLU/RSV testing.  Fact Sheet for Patients: EntrepreneurPulse.com.au  Fact Sheet for Healthcare Providers: IncredibleEmployment.be  This test is not yet approved or cleared by the [place] FDA and has been  authorized for detection and/or diagnosis of SARS-CoV-2 by FDA under an Emergency Use Authorization (EUA). This EUA will remain in effect (meaning this test can be used) for the duration of the COVID-19 declaration under Section 564(b)(1) of the Act, 21 U.S.C. section 360bbb-3(b)(1), unless the authorization is terminated or revoked.  Performed at [facility], [place] [address] [place], [place] [address]   Culture, blood (single)     Status: None (Preliminary result)   Collection Time: 03/31/21 10:27 PM   Specimen: BLOOD LEFT WRIST  Result Value Ref Range Status   Specimen Description   Final    BLOOD LEFT WRIST Performed at [facility] [address] [place], [place] [address]    Special Requests   Final    BOTTLES DRAWN AEROBIC AND ANAEROBIC Blood Culture adequate volume Performed at [facility] [address], [place], [place] [address]    Culture PENDING  Incomplete   Report Status PENDING  Incomplete    Labs: CBC:  Recent Labs  Lab 03/31/21 1825 04/01/21 0315  WBC 7.5 5.7  NEUTROABS 3.8  --   HGB 14.1 13.4  HCT 42.3 40.8  MCV 91.2 92.5  PLT 284 697   Basic Metabolic Panel: Recent Labs  Lab 03/31/21 1825 04/01/21 0315  NA 136 140  K 3.2* 3.6  CL 104 105  CO2 24 28  GLUCOSE 92 98  BUN 15 16  CREATININE 0.60 0.71  CALCIUM 9.2 9.6   Liver Function Tests: No results for input(s): AST, ALT, ALKPHOS, BILITOT, PROT, ALBUMIN in the last 168 hours. CBG: No results for input(s): GLUCAP in the last 168 hours.  Discharge time spent: greater than 30 minutes.  Signed: Oswald Hillock, MD [facility] 04/01/2021

## 2021-04-01 NOTE — Progress Notes (Signed)
The patient is alert and oriented and has been seen by her physician. The orders for discharge were written. IV has been removed. Went over discharge instructions with patient and family. She is being discharged with all of her belongings.

## 2021-04-03 LAB — AEROBIC CULTURE W GRAM STAIN (SUPERFICIAL SPECIMEN): Gram Stain: NONE SEEN

## 2021-04-06 LAB — CULTURE, BLOOD (SINGLE)
Culture: NO GROWTH
Special Requests: ADEQUATE

## 2021-04-10 ENCOUNTER — Other Ambulatory Visit: Payer: Self-pay | Admitting: *Deleted

## 2021-04-10 ENCOUNTER — Other Ambulatory Visit: Payer: Self-pay

## 2021-04-10 ENCOUNTER — Encounter: Payer: Self-pay | Admitting: Hematology and Oncology

## 2021-04-10 ENCOUNTER — Inpatient Hospital Stay: Payer: 59

## 2021-04-10 ENCOUNTER — Inpatient Hospital Stay: Payer: 59 | Attending: Hematology | Admitting: Hematology and Oncology

## 2021-04-10 VITALS — BP 147/65 | HR 66 | Temp 97.5°F | Resp 18 | Ht 67.0 in | Wt 195.6 lb

## 2021-04-10 DIAGNOSIS — Z170 Estrogen receptor positive status [ER+]: Secondary | ICD-10-CM

## 2021-04-10 DIAGNOSIS — C50111 Malignant neoplasm of central portion of right female breast: Secondary | ICD-10-CM

## 2021-04-10 DIAGNOSIS — I10 Essential (primary) hypertension: Secondary | ICD-10-CM | POA: Diagnosis not present

## 2021-04-10 DIAGNOSIS — M2550 Pain in unspecified joint: Secondary | ICD-10-CM | POA: Insufficient documentation

## 2021-04-10 DIAGNOSIS — Z803 Family history of malignant neoplasm of breast: Secondary | ICD-10-CM | POA: Diagnosis not present

## 2021-04-10 DIAGNOSIS — N63 Unspecified lump in unspecified breast: Secondary | ICD-10-CM

## 2021-04-10 DIAGNOSIS — N6320 Unspecified lump in the left breast, unspecified quadrant: Secondary | ICD-10-CM | POA: Insufficient documentation

## 2021-04-10 DIAGNOSIS — F17210 Nicotine dependence, cigarettes, uncomplicated: Secondary | ICD-10-CM | POA: Diagnosis not present

## 2021-04-10 DIAGNOSIS — Z808 Family history of malignant neoplasm of other organs or systems: Secondary | ICD-10-CM | POA: Diagnosis not present

## 2021-04-10 LAB — CBC WITH DIFFERENTIAL (CANCER CENTER ONLY)
Abs Immature Granulocytes: 0.04 10*3/uL (ref 0.00–0.07)
Basophils Absolute: 0.1 10*3/uL (ref 0.0–0.1)
Basophils Relative: 1 %
Eosinophils Absolute: 0.1 10*3/uL (ref 0.0–0.5)
Eosinophils Relative: 1 %
HCT: 39.4 % (ref 36.0–46.0)
Hemoglobin: 13.2 g/dL (ref 12.0–15.0)
Immature Granulocytes: 1 %
Lymphocytes Relative: 34 %
Lymphs Abs: 2.7 10*3/uL (ref 0.7–4.0)
MCH: 30.4 pg (ref 26.0–34.0)
MCHC: 33.5 g/dL (ref 30.0–36.0)
MCV: 90.8 fL (ref 80.0–100.0)
Monocytes Absolute: 0.5 10*3/uL (ref 0.1–1.0)
Monocytes Relative: 6 %
Neutro Abs: 4.7 10*3/uL (ref 1.7–7.7)
Neutrophils Relative %: 57 %
Platelet Count: 274 10*3/uL (ref 150–400)
RBC: 4.34 MIL/uL (ref 3.87–5.11)
RDW: 13.2 % (ref 11.5–15.5)
WBC Count: 8 10*3/uL (ref 4.0–10.5)
nRBC: 0 % (ref 0.0–0.2)

## 2021-04-10 LAB — CMP (CANCER CENTER ONLY)
ALT: 21 U/L (ref 0–44)
AST: 16 U/L (ref 15–41)
Albumin: 4.5 g/dL (ref 3.5–5.0)
Alkaline Phosphatase: 52 U/L (ref 38–126)
Anion gap: 4 — ABNORMAL LOW (ref 5–15)
BUN: 19 mg/dL (ref 6–20)
CO2: 26 mmol/L (ref 22–32)
Calcium: 8.9 mg/dL (ref 8.9–10.3)
Chloride: 109 mmol/L (ref 98–111)
Creatinine: 0.76 mg/dL (ref 0.44–1.00)
GFR, Estimated: >60 mL/min (ref >60)
Glucose, Bld: 85 mg/dL (ref 70–99)
Potassium: 4 mmol/L (ref 3.5–5.1)
Sodium: 139 mmol/L (ref 135–145)
Total Bilirubin: 0.1 mg/dL — ABNORMAL LOW (ref 0.3–1.2)
Total Protein: 6.9 g/dL (ref 6.5–8.1)

## 2021-04-10 MED ORDER — LEUPROLIDE ACETATE 3.75 MG IM KIT
3.7500 mg | PACK | Freq: Once | INTRAMUSCULAR | Status: AC
  Administered 2021-04-10: 3.75 mg via INTRAMUSCULAR
  Filled 2021-04-10: qty 3.75

## 2021-04-10 NOTE — Progress Notes (Signed)
[place] NOTE  Patient Care Team: Remi Haggard, FNP as PCP - General (Family Medicine)  CHIEF COMPLAINTS/PURPOSE OF CONSULTATION:  Right breast cancer  HISTORY OF PRESENTING ILLNESS:  Melissa Shepherd 51 y.o. female is here because of recent diagnosis of right breast cancer  I reviewed her records extensively and collaborated the history with the patient.  SUMMARY OF ONCOLOGIC HISTORY: Oncology History  Malignant neoplasm of central portion of right breast in female, estrogen receptor positive ([place])  01/27/2020 Cancer Staging   Staging form: Breast, AJCC 8th Edition - Pathologic stage from 01/27/2020: Stage IA (pT1a, pN0, cM0, G3, ER+, PR+, HER2-) - Signed by Truitt Merle, MD on 03/18/2021 Stage prefix: Initial diagnosis Multigene prognostic tests performed: None Histologic grading system: 3 grade system Residual tumor (R): R0 - None    03/06/2021 Initial Diagnosis   Malignant neoplasm of central portion of right breast in female, estrogen receptor positive ([place])    Treatment plan/history: 1. Status post right mastectomy with immediate reconstruction on 01/27/2020. Pathology revealed 3 mm grade 3 invasive ductal carcinoma with associated DCIS. 4 sentinel nodes negative for metastatic disease. Breast prognostic panel showed tumor to be ER +50%, moderate intensity. PR positive, 5%, weak intensity. HER-2/neu negative with Ki-67 10%.  2. Initiation of Lupron monthly March 20, 2020.  3. Initiation of adjuvant anastrozole 1 mg daily, patient discontinued secondary to myalgias and arthralgias.  4. Initiate letrozole 2.5 mg daily, 11/14/2020- 12/19/2020, discontinued secondary to myalgias and arthralgias  5. Initiation of Aromasin 25 mg 12/19/2020   She is here to establish given change in insurance, She missed her last appointment with Dr Burr Medico. She has noticed breast nodule in the right mastectomy area around 2 weeks ago. She was supposed to get her last lupron  Jan 27 th but missed it, need this to be scheduled. She has been taking exemestane as prescribed. She also needs left breast mammogram to be scheduled. She doesn't remember having bone density scan. She has no dental insurance currently, didn't have dental appointment recently Besides nodule in right breast, she also has BLE pains, has a vascular appointment scheduled. Rest of the pertinent 10 point ROS reviewed and negative.  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 2003.   2   CHOLECYSTECTOMY N/A 11/26/2016   Procedure: LAPAROSCOPIC CHOLECYSTECTOMY WITH INTRAOPERATIVE CHOLANGIOGRAM;  Surgeon: Fanny Skates, MD;  Location: [facility] OR;  Service: General;  Laterality: N/A;   MASTECTOMY     WISDOM TOOTH EXTRACTION      SOCIAL HISTORY: Social History   Socioeconomic History   Marital status: Married    Spouse name: Not on file   Number of children: Not on file   Years of education: Not on file   Highest education level: Not on file  Occupational History   Not on file  Tobacco Use  Smoking status: Every Day    Packs/day: 1.00    Years: 31.00    Pack years: 31.00    Types: Cigarettes   Smokeless tobacco: Never  Vaping Use   Vaping Use: Never used  Substance and Sexual Activity   Alcohol use: Yes    Alcohol/week: 2.0 standard drinks    Types: 2 Cans of beer per week   Drug use: No   Sexual activity: Not Currently  Other Topics Concern   Not on file  Social History  Narrative   Hair dresser    Lives with husband and 72 yo daughter   High school degree and cosmetology school    Right handed    Caffeine- 1 cup of coffee, 1 soda    Pets: 1 dog inside    Social Determinants of Radio broadcast assistant Strain: Not on file  Food Insecurity: Not on file  Transportation Needs: Not on file  Physical Activity: Not on file  Stress: Not on file  Social Connections: Not on file  Intimate Partner Violence: Not on file    FAMILY HISTORY: Family History  Problem Relation Age of Onset   Hypertension Mother    Cancer Maternal Grandmother        Breast   Cancer Sister        cervical    ALLERGIES:  is allergic to ezetimibe and prednisone.  MEDICATIONS:  Current Outpatient Medications  Medication Sig Dispense Refill   ALPRAZolam (XANAX) 1 MG tablet Take 1 mg by mouth 3 (three) times daily.     amLODipine (NORVASC) 10 MG tablet Take 10 mg by mouth daily.     amphetamine-dextroamphetamine (ADDERALL) 20 MG tablet Take 20 mg by mouth 2 (two) times daily.     ascorbic acid (VITAMIN C) 250 MG CHEW Chew 500 mg by mouth at bedtime.     CALCIUM PO Take 600 mg by mouth at bedtime.     Cholecalciferol (VITAMIN D-3) 125 MCG (5000 UT) TABS Take 2 tablets by mouth at bedtime.     colestipol (COLESTID) 1 g tablet Take 2 g by mouth 2 (two) times daily.     exemestane (AROMASIN) 25 MG tablet Take 25 mg by mouth every evening.     folic acid (FOLVITE) 1 MG tablet Take 1 mg by mouth at bedtime.     furosemide (LASIX) 20 MG tablet Take 20 mg by mouth daily as needed for fluid or edema.     gabapentin (NEURONTIN) 300 MG capsule Take 300 mg by mouth 3 (three) times daily.     hydrochlorothiazide (HYDRODIURIL) 12.5 MG tablet TAKE 1 TABLET BY MOUTH DAILY. 30 tablet 7   meloxicam (MOBIC) 15 MG tablet Take 15 mg by mouth as needed for pain.     metoprolol succinate (TOPROL-XL) 100 MG 24 hr tablet Take 100 mg by mouth 2 (two) times daily.     mupirocin ointment (BACTROBAN)  2 % Apply 1 application topically daily as needed (cyst). "cyst in panty line"     nystatin (MYCOSTATIN) 100000 UNIT/ML suspension Use as directed 5 mLs (500,000 Units total) in the mouth or throat 4 (four) times daily as needed (Oral irritation or Oral thrush). 180 mL 0   potassium chloride SA (KLOR-CON M) 20 MEQ tablet Take 20 mEq by mouth at bedtime.     rosuvastatin (CRESTOR) 40 MG tablet Take 40 mg by mouth at bedtime.     sertraline (ZOLOFT) 100 MG tablet Take 100 mg by mouth  at bedtime.     No current facility-administered medications for this visit.    REVIEW OF SYSTEMS:   Constitutional: Denies fevers, chills or abnormal night sweats Eyes: Denies blurriness of vision, double vision or watery eyes Ears, nose, mouth, throat, and face: Denies mucositis or sore throat Respiratory: Denies cough, dyspnea or wheezes Cardiovascular: Denies palpitation, chest discomfort or lower extremity swelling Gastrointestinal:  Denies nausea, heartburn or change in bowel habits Skin: Denies abnormal skin rashes Lymphatics: Denies new lymphadenopathy or easy bruising Neurological:Denies numbness, tingling or new weaknesses Behavioral/Psych: Mood is stable, no new changes  Breast: complains of lumps in breast. All other systems were reviewed with the patient and are negative.  PHYSICAL EXAMINATION: ECOG PERFORMANCE STATUS: 0 - Asymptomatic  Vitals:   04/10/21 1033  BP: (!) 147/65  Pulse: 66  Resp: 18  Temp: (!) 97.5 F (36.4 C)  SpO2: 98%   Filed Weights   04/10/21 1033  Weight: 195 lb 9.6 oz (88.7 kg)    GENERAL:alert, no distress and comfortable SKIN: skin color, texture, turgor are normal, no rashes or significant lesions EYES: normal, conjunctiva are pink and non-injected, sclera clear OROPHARYNX:no exudate, no erythema and lips, buccal mucosa, and tongue normal  NECK: supple, thyroid normal size, non-tender, without nodularity LYMPH:  no palpable lymphadenopathy in the cervical,  axillary or inguinal LUNGS: clear to auscultation and percussion with normal breathing effort HEART: regular rate & rhythm and no murmurs and no lower extremity edema ABDOMEN:abdomen soft, non-tender and normal bowel sounds Musculoskeletal:no cyanosis of digits and no clubbing  PSYCH: alert & oriented x 3 with fluent speech NEURO: no focal motor/sensory deficits BREAST: Palpable chest wall site, concerning, will need to be evaluated.  I could not feel any regional lymphadenopathy.  I could not be any masses in the left breast or left axilla.  LABORATORY DATA:  I have reviewed the data as listed Lab Results  Component Value Date   WBC 5.7 04/01/2021   HGB 13.4 04/01/2021   HCT 40.8 04/01/2021   MCV 92.5 04/01/2021   PLT 239 04/01/2021   Lab Results  Component Value Date   NA 140 04/01/2021   K 3.6 04/01/2021   CL 105 04/01/2021   CO2 28 04/01/2021    RADIOGRAPHIC STUDIES: I have personally reviewed the radiological reports and agreed with the findings in the report.  ASSESSMENT AND PLAN:  Malignant neoplasm of central portion of right breast in female, estrogen receptor positive ([place]) This is a 51 year old premenopausal female patient with right breast invasive ductal carcinoma status post right mastectomy, final pathology showing 8 mm right breast IDC with associated DCIS, 4 sentinel nodes negative, prognostic showed ER 50% positive, PR positive, 5% weak intensity and HER2 negative with Ki-67 of 10%.  She has been receiving adjuvant ovarian suppression with Lupron and Aromasin.  She has tried Arimidex as well as letrozole in the past, could not tolerated because of arthralgias.  She is switching providers because of some change in insurance.  She had her last Lupron shot in December, missed her scheduled Lupron at the end of January.  She has been taking Aromasin as prescribed.  Most recently she noticed a small nodule in the right chest wall site as well as some left axillary  swelling.  I could not appreciate the left axillary lymphadenopathy but I did feel the nodule in the right chest wall.  Would recommend further imaging for this as soon as possible to rule out recurrence.  We will  arrange for Lupron today.  I have discussed with her that without Lupron exemestane is ineffective hence she needs to try and stay on scheduled.  If she is not willing to pursue Lupron on a regular basis, she is best served by taking tamoxifen.  She will try to keep her appointments regularly as scheduled.  I have also ordered left breast screening mammogram.  She will also need a baseline bone density.  However it might be challenging to give her bisphosphonates because of lack of dental insurance and lack of dental evaluation.  I spent 60 minutes in the care of this patient reviewing history, medical records, physical examination, counseling and coordination of care. All questions were answered. The patient knows to call the clinic with any problems, questions or concerns.    Benay Pike, MD 04/10/21

## 2021-04-10 NOTE — Assessment & Plan Note (Signed)
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  She has been receiving adjuvant ovarian suppression with Lupron and Aromasin.  She has tried Arimidex as well as letrozole in the past, could not tolerated because of arthralgias.  She is switching providers because of some change in insurance.  She had her last Lupron shot in December, missed her scheduled Lupron at the end of January.  She has been taking Aromasin as prescribed.  Most recently she noticed a small nodule in the right chest wall site as well as some left axillary swelling.  I could not appreciate the left axillary lymphadenopathy but I did feel the nodule in the right chest wall.  Would recommend further imaging for this as soon as possible to rule out recurrence.  We will arrange for Lupron today.  I have discussed with her that without Lupron exemestane is ineffective hence she needs to try and stay on scheduled.  If she is not willing to pursue Lupron on a regular basis, she is best served by taking tamoxifen.  She will try to keep her appointments regularly as scheduled.  I have also ordered left breast screening mammogram.  She will also need a baseline bone density.  However it might be challenging to give her bisphosphonates because of lack of dental insurance and lack of dental evaluation.

## 2021-04-16 ENCOUNTER — Encounter: Payer: Self-pay | Admitting: Hematology

## 2021-04-17 ENCOUNTER — Telehealth: Payer: Self-pay | Admitting: *Deleted

## 2021-04-17 NOTE — Telephone Encounter (Signed)
Spoke to pt concerning [facility] enrollment d/t her insurance not being accepted within a [place] mile radium. Pt informed [facility] coordinator has called and will get her scheduled as soon as they receive the reports. Pt informed she is working on getting them sent today. Provided contact information and navigation resources. No further questions voiced at this time.

## 2021-04-20 ENCOUNTER — Telehealth: Payer: Self-pay | Admitting: *Deleted

## 2021-04-20 NOTE — Telephone Encounter (Signed)
Spoke to pt regarding previous imaging. Pt has prior breast images on a disc. Instructed pt to take to [facility]. Provided number and address. [facility] and [facility] notified

## 2021-04-23 ENCOUNTER — Ambulatory Visit (INDEPENDENT_AMBULATORY_CARE_PROVIDER_SITE_OTHER): Payer: 59 | Admitting: Vascular Surgery

## 2021-04-23 ENCOUNTER — Encounter (INDEPENDENT_AMBULATORY_CARE_PROVIDER_SITE_OTHER): Payer: Self-pay | Admitting: Vascular Surgery

## 2021-04-23 ENCOUNTER — Other Ambulatory Visit: Payer: Self-pay

## 2021-04-23 ENCOUNTER — Telehealth: Payer: Self-pay

## 2021-04-23 VITALS — BP 143/89 | HR 66 | Resp 15 | Ht 65.5 in | Wt 206.0 lb

## 2021-04-23 DIAGNOSIS — G8929 Other chronic pain: Secondary | ICD-10-CM

## 2021-04-23 DIAGNOSIS — M545 Low back pain, unspecified: Secondary | ICD-10-CM | POA: Diagnosis not present

## 2021-04-23 DIAGNOSIS — M79669 Pain in unspecified lower leg: Secondary | ICD-10-CM | POA: Diagnosis not present

## 2021-04-23 DIAGNOSIS — I10 Essential (primary) hypertension: Secondary | ICD-10-CM | POA: Diagnosis not present

## 2021-04-23 DIAGNOSIS — M7989 Other specified soft tissue disorders: Secondary | ICD-10-CM

## 2021-04-23 NOTE — Telephone Encounter (Signed)
Telephoned patient, she will call back and schedule appointment with [facility].

## 2021-04-24 ENCOUNTER — Telehealth: Payer: Self-pay | Admitting: *Deleted

## 2021-04-24 NOTE — Telephone Encounter (Signed)
Called pt, discussed will receive a call to schedule appt for MM/US. Received verbal understanding. Discussed will cx appt with Dr. Chryl Heck on 3/2 and will r/s once have date for MM/US. Denies further needs or questions

## 2021-04-25 ENCOUNTER — Encounter (INDEPENDENT_AMBULATORY_CARE_PROVIDER_SITE_OTHER): Payer: Self-pay | Admitting: Vascular Surgery

## 2021-04-25 ENCOUNTER — Other Ambulatory Visit: Payer: Self-pay

## 2021-04-25 DIAGNOSIS — Z170 Estrogen receptor positive status [ER+]: Secondary | ICD-10-CM

## 2021-04-25 DIAGNOSIS — N63 Unspecified lump in unspecified breast: Secondary | ICD-10-CM

## 2021-04-25 DIAGNOSIS — M79669 Pain in unspecified lower leg: Secondary | ICD-10-CM | POA: Insufficient documentation

## 2021-04-25 NOTE — Progress Notes (Signed)
MRN : [id number]  Melissa Shepherd is a 51 y.o. (05/30/1970) female who presents with chief complaint of pain.  History of Present Illness:   Patient is seen for evaluation of leg pain and leg swelling. The patient first noticed the swelling remotely. The swelling is associated with pain and discoloration. The pain and swelling worsens with prolonged dependency and improves with elevation. The pain is unrelated to activity.  The patient notes that in the morning the legs are significantly improved but they steadily worsened throughout the course of the day. The patient also notes a steady worsening of the discoloration in the ankle and shin area.   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   The patient denies claudication symptoms.  The patient denies symptoms consistent with rest pain.  The patient has no had any past angiography, interventions or vascular surgery.  Elevation makes the leg symptoms better, dependency makes them much worse. There is no history of ulcerations. The patient denies any recent changes in medications.  The patient has not been wearing graduated compression.  The patient denies a history of DVT or PE. There is no prior history of phlebitis. There is no history of primary lymphedema.  No history of malignancies. No history of trauma or groin or pelvic surgery. There is no history of radiation treatment to the groin or pelvis  The patient denies amaurosis fugax or recent TIA symptoms. There are no recent neurological changes noted. The patient denies recent episodes of angina or shortness of breath   Current Meds  Medication Sig   ALPRAZolam (XANAX) 1 MG tablet Take 1 mg by mouth 3 (three) times daily.   amphetamine-dextroamphetamine (ADDERALL) 20 MG tablet Take 20 mg by mouth 2 (two) times daily.   ascorbic acid (VITAMIN C) 250 MG CHEW Chew 500 mg by mouth at bedtime.   CALCIUM PO Take 600 mg by mouth at bedtime.   Cholecalciferol  (VITAMIN D-3) 125 MCG (5000 UT) TABS Take 2 tablets by mouth at bedtime.   colestipol (COLESTID) 1 g tablet Take 2 g by mouth 2 (two) times daily.   exemestane (AROMASIN) 25 MG tablet Take 25 mg by mouth every evening.   folic acid (FOLVITE) 1 MG tablet Take 1 mg by mouth at bedtime.   furosemide (LASIX) 20 MG tablet Take 20 mg by mouth daily as needed for fluid or edema.   gabapentin (NEURONTIN) 300 MG capsule Take 300 mg by mouth 3 (three) times daily.   hydrochlorothiazide (HYDRODIURIL) 12.5 MG tablet TAKE 1 TABLET BY MOUTH DAILY.   meloxicam (MOBIC) 15 MG tablet Take 15 mg by mouth as needed for pain.   metoprolol succinate (TOPROL-XL) 100 MG 24 hr tablet Take 100 mg by mouth 2 (two) times daily.   mupirocin ointment (BACTROBAN) 2 % Apply 1 application topically daily as needed (cyst). "cyst in panty line"   nystatin (MYCOSTATIN) 100000 UNIT/ML suspension Use as directed 5 mLs (500,000 Units total) in the mouth or throat 4 (four) times daily as needed (Oral irritation or Oral thrush).   potassium chloride SA (KLOR-CON M) 20 MEQ tablet Take 20 mEq by mouth at bedtime.   rosuvastatin (CRESTOR) 40 MG tablet Take 40 mg by mouth at bedtime.   sertraline (ZOLOFT) 100 MG tablet Take 100 mg by mouth at bedtime.    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 2003.   2   CHOLECYSTECTOMY N/A 11/26/2016   Procedure: LAPAROSCOPIC CHOLECYSTECTOMY WITH INTRAOPERATIVE CHOLANGIOGRAM;  Surgeon: Fanny Skates, MD;  Location: [facility];   Service: General;  Laterality: N/A;   MASTECTOMY     WISDOM TOOTH EXTRACTION      Social History Social History   Tobacco Use   Smoking status: Every Day    Packs/day: 1.00    Years: 31.00    Pack years: 31.00    Types: Cigarettes   Smokeless tobacco: Never  Vaping Use   Vaping Use: Never used  Substance Use Topics   Alcohol use: Yes    Alcohol/week: 2.0 standard drinks    Types: 2 Cans of beer per week   Drug use: No    Family History Family History  Problem Relation Age of Onset   Hypertension Mother    Cancer Maternal Grandmother        Breast   Cancer Sister        cervical    Allergies  Allergen Reactions   Ezetimibe Other (See Comments)    Patient does not report what actually happens with Zetia Patient does not report what actually happens with Zetia    Prednisone Anxiety    Other reaction(s): Other, Other (See Comments), Psychosis (intolerance), Unknown     REVIEW OF SYSTEMS (Negative unless checked)  Constitutional: [] Weight loss  [] Fever  [] Chills Cardiac: [] Chest pain   [] Chest pressure   [] Palpitations   [] Shortness of breath when laying flat   [] Shortness of breath with exertion. Vascular:  [] Pain in legs with walking   [x] Pain in legs at rest  [] History of DVT   [] Phlebitis   [x] Swelling in legs   [x] Varicose veins   [] Non-healing ulcers Pulmonary:   [] Uses home oxygen   [] Productive cough   [] Hemoptysis   [] Wheeze  [] COPD   [] Asthma Neurologic:  [] Dizziness   [] Seizures   [] History of stroke   [] History of TIA  [] Aphasia   [] Vissual changes   [] Weakness or numbness in arm   [] Weakness or numbness in leg Musculoskeletal:   [] Joint swelling   [] Joint pain   [] Low back pain Hematologic:  [] Easy bruising  [] Easy bleeding   [] Hypercoagulable state   [] Anemic Gastrointestinal:  [] Diarrhea   [] Vomiting  [] Gastroesophageal reflux/heartburn   [] Difficulty swallowing. Genitourinary:  [] Chronic kidney disease   [] Difficult urination  [] Frequent urination    [] Blood in urine Skin:  [] Rashes   [] Ulcers  Psychological:  [] History of anxiety   []  History of major depression.  Physical Examination  Vitals:   04/23/21 1417  BP: (!) 143/89  Pulse: 66  Resp: 15  Weight: 206 lb (93.4 kg)  Height: 5' 5.5" (1.664 m)   Body mass index is 33.76 kg/m. Gen: WD/WN, NAD Head: [place]/AT, No temporalis wasting.  Ear/Nose/Throat: Hearing grossly intact, nares w/o erythema or drainage, pinna without lesions Eyes: PER, EOMI, sclera nonicteric.  Neck: Supple, no gross masses.  No JVD.  Pulmonary:  Good air movement, no audible wheezing, no use of accessory muscles.  Cardiac: RRR, precordium not hyperdynamic. Vascular:  scattered varicosities present bilaterally.  Mild venous stasis changes to the legs bilaterally.  2+ soft pitting edema  Vessel Right Left  Radial Palpable Palpable  Gastrointestinal: soft, non-distended. No guarding/no peritoneal signs.  Musculoskeletal: M/S 5/5 throughout.  No deformity.  Neurologic: CN 2-12 intact. Pain and light touch intact in extremities.  Symmetrical.  Speech is fluent. Motor exam as listed above. Psychiatric: Judgment intact, Mood & affect appropriate for pt's clinical situation. Dermatologic: Venous rashes no ulcers noted.  No changes consistent with cellulitis. Lymph : No lichenification or skin changes of chronic lymphedema.  CBC Lab Results  Component Value Date   WBC 8.0 04/10/2021   HGB 13.2 04/10/2021   HCT 39.4 04/10/2021   MCV 90.8 04/10/2021   PLT 274 04/10/2021    BMET    Component Value Date/Time   NA 139 04/10/2021 1139   NA 140 08/03/2013 0000   K 4.0 04/10/2021 1139   CL 109 04/10/2021 1139   CO2 26 04/10/2021 1139   GLUCOSE 85 04/10/2021 1139   BUN 19 04/10/2021 1139   BUN 11 08/03/2013 0000   CREATININE 0.76 04/10/2021 1139   CALCIUM 8.9 04/10/2021 1139   GFRNONAA >60 04/10/2021 1139   GFRAA >60 05/17/2017 1943   Estimated Creatinine Clearance: 97.1 mL/min (by C-G formula based on  SCr of 0.76 mg/dL).  COAG No results found for: INR, PROTIME  Radiology DG Tibia/Fibula Left  Result Date: 03/31/2021 CLINICAL DATA:  Wound on the lateral calf without healing. EXAM: LEFT TIBIA AND FIBULA - 2 VIEW COMPARISON:  None. FINDINGS: Degenerative changes in the left knee and ankle joints. No evidence of acute fracture or dislocation. No focal bone lesion or cortical destruction. No lucencies to suggest osteomyelitis. Soft tissues are unremarkable. No soft tissue gas or radiopaque foreign body identified. IMPRESSION: Mild degenerative changes in the knee and ankle joints. No acute bony abnormalities. Electronically Signed   By: Lucienne Capers M.D.   On: 03/31/2021 19:09     Assessment/Plan 1. Pain and swelling of lower leg, unspecified laterality  Recommend:  The patient has atypical pain symptoms for pure atherosclerotic disease. 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  Noninvasive studies including venous ultrasound of the legs will be obtained and the patient will follow up with me to review these studies.  I suspect the patient is c/o pseudoclaudication given her history of LS spine disease.  Patient should have an evaluation by pain management and I have placed an ambulatory consult.  The patient should continue walking and begin a more formal exercise program. The patient should continue his antiplatelet therapy and aggressive treatment of the lipid abnormalities.  The patient should begin wearing graduated compression socks 15-20 mmHg strength to control edema.     A total of 45 minutes was spent with this patient and greater than 50% was spent in counseling and coordination of care with the patient.  Discussion included the treatment options for vascular disease including indications for surgery and intervention.  Also discussed is the  appropriate timing of treatment.  In addition medical therapy was discussed.  - Ambulatory referral to Pain Clinic - VAS [place] LOWER EXTREMITY VENOUS REFLUX; Future  2. Chronic bilateral low back pain, unspecified whether sciatica present See #1 -  Ambulatory referral to Pain Clinic  3. Essential hypertension Continue antihypertensive medications as already ordered, these medications have been reviewed and there are no changes at this time.     Hortencia Pilar, MD  04/25/2021 8:54 AM

## 2021-04-26 ENCOUNTER — Inpatient Hospital Stay: Payer: 59 | Admitting: Hematology and Oncology

## 2021-04-27 ENCOUNTER — Other Ambulatory Visit: Payer: Self-pay | Admitting: Hematology and Oncology

## 2021-05-03 ENCOUNTER — Encounter: Payer: Self-pay | Admitting: *Deleted

## 2021-05-08 ENCOUNTER — Other Ambulatory Visit: Payer: Self-pay

## 2021-05-08 ENCOUNTER — Inpatient Hospital Stay: Payer: 59 | Attending: Hematology

## 2021-05-08 VITALS — BP 142/105 | HR 58 | Temp 97.8°F | Resp 16

## 2021-05-08 DIAGNOSIS — C50111 Malignant neoplasm of central portion of right female breast: Secondary | ICD-10-CM | POA: Insufficient documentation

## 2021-05-08 DIAGNOSIS — Z170 Estrogen receptor positive status [ER+]: Secondary | ICD-10-CM | POA: Insufficient documentation

## 2021-05-08 DIAGNOSIS — Z79818 Long term (current) use of other agents affecting estrogen receptors and estrogen levels: Secondary | ICD-10-CM | POA: Diagnosis not present

## 2021-05-08 MED ORDER — LEUPROLIDE ACETATE 3.75 MG IM KIT
3.7500 mg | PACK | Freq: Once | INTRAMUSCULAR | Status: AC
  Administered 2021-05-08: 3.75 mg via INTRAMUSCULAR
  Filled 2021-05-08: qty 3.75

## 2021-05-08 NOTE — Progress Notes (Signed)
Patient states that she takes B/P meds and is in some pain with her legs and hips ?

## 2021-05-17 ENCOUNTER — Other Ambulatory Visit: Payer: Self-pay

## 2021-05-17 ENCOUNTER — Other Ambulatory Visit: Payer: Self-pay | Admitting: Obstetrics and Gynecology

## 2021-05-17 ENCOUNTER — Encounter: Payer: Self-pay | Admitting: Hematology

## 2021-05-17 ENCOUNTER — Ambulatory Visit
Admission: RE | Admit: 2021-05-17 | Discharge: 2021-05-17 | Disposition: A | Discharge status: Home / Self Care | Payer: 59 | Source: Ambulatory Visit | Attending: Obstetrics and Gynecology | Admitting: Obstetrics and Gynecology

## 2021-05-17 ENCOUNTER — Ambulatory Visit: Payer: Self-pay | Admitting: *Deleted

## 2021-05-17 ENCOUNTER — Ambulatory Visit
Admission: RE | Admit: 2021-05-17 | Discharge: 2021-05-17 | Disposition: A | Discharge status: Home / Self Care | Payer: No Typology Code available for payment source | Source: Ambulatory Visit | Attending: Obstetrics and Gynecology | Admitting: Obstetrics and Gynecology

## 2021-05-17 VITALS — BP 152/100 | Wt 188.5 lb

## 2021-05-17 DIAGNOSIS — Z1211 Encounter for screening for malignant neoplasm of colon: Secondary | ICD-10-CM

## 2021-05-17 DIAGNOSIS — N63 Unspecified lump in unspecified breast: Secondary | ICD-10-CM

## 2021-05-17 DIAGNOSIS — C50111 Malignant neoplasm of central portion of right female breast: Secondary | ICD-10-CM

## 2021-05-17 DIAGNOSIS — Z1239 Encounter for other screening for malignant neoplasm of breast: Secondary | ICD-10-CM

## 2021-05-17 DIAGNOSIS — N6312 Unspecified lump in the right breast, upper inner quadrant: Secondary | ICD-10-CM

## 2021-05-17 DIAGNOSIS — N6310 Unspecified lump in the right breast, unspecified quadrant: Secondary | ICD-10-CM

## 2021-05-17 DIAGNOSIS — R2232 Localized swelling, mass and lump, left upper limb: Secondary | ICD-10-CM

## 2021-05-17 NOTE — Patient Instructions (Addendum)
Explained breast self awareness with Melissa Shepherd. Patient did not need a Pap smear today due to last Pap smear and HPV typing was 10/19/2020. Per ASCCP guidelines next Pap smear is due in one year and per patient her provider recommended a Pap smear in one year. Referred patient to the [facility] for a diagnostic mammogram. Appointment scheduled Thursday, May 17, 2021 at 1450. Patient aware of appointment and will be there. Discussed smoking cessation with patient. Referred to the [facility] Quitline. Melissa Shepherd verbalized understanding.  Melissa Shepherd, [name], RN 1:45 PM

## 2021-05-17 NOTE — Progress Notes (Signed)
[name] is a 51 y.o. female who presents to [facility] clinic today with complaint of left axillary and right breast lump since end of January or early February.  ?  ?Pap Smear: Pap smear not completed today. Last Pap smear was 10/19/2020 at [facility] and was normal with negative HPV. Per patient has history of an abnormal Pap smear. Per patient has a history of two abnormal Pap smears 10 years and 17 years ago that colposcopies were completed for follow up. Patient's previous Pap smear 10/07/2019 was normal with positive HPV that a repeat Pap smear in one year was completed for follow up that was her most recent Pap smear. Last Pap smear result is available in Epic. ?  ?Physical exam: ?Breasts ?Left breast larger than right breast due to history of right breast mastectomy for breast cancer in December 2021. No skin abnormalities bilateral breasts. No nipple retraction left breast. No nipple discharge left breast. No lymphadenopathy right axilla. Lymphadenopathy left axilla. Palpated a lump within the right breast at 2 o'clock 6 cm from mastectomy scar. Palpated a lump within the left axilla at 1 o'clock 12 cm from the nipple. No complaints of pain or tenderness on exam. ? ?MS DIGITAL DIAG TOMO UNI LEFT ? ?Result Date: 05/17/2021 ?CLINICAL DATA:  Personal history of malignant RIGHT mastectomy in 2021, presenting now with a possible palpable lump in the LEFT axilla and a possible palpable lump at the mastectomy site. EXAM: DIGITAL DIAGNOSTIC UNILATERAL LEFT MAMMOGRAM WITH TOMOSYNTHESIS AND CAD; ULTRASOUND RIGHT BREAST LIMITED; [place] AXILLARY LEFT TECHNIQUE: Left digital diagnostic mammography and breast tomosynthesis was performed. The images were evaluated with computer-aided detection.; Targeted ultrasound examination of the right breast was performed; Targeted ultrasound examination of the left axilla was performed. COMPARISON:  Previous exam(s). ACR Breast Density Category c: The breast  tissue is heterogeneously dense, which may obscure small masses. FINDINGS: Full field CC and MLO views of the LEFT breast and a spot tangential view of the palpable concern in the LEFT axilla were obtained. No findings suspicious for malignancy. No mammographic abnormality in the axilla at the site of palpable concern. Targeted ultrasound is performed in the area of palpable concern in the LEFT axilla, demonstrating normal fibrofatty tissue. There is no mass or pathologic lymphadenopathy. Targeted ultrasound is performed in the area of palpable concern at the RIGHT mastectomy site, demonstrating a superficial oval circumscribed parallel anechoic mass with a thin internal septation superior to the mastectomy scar adjacent to the sternum measuring approximately 8 x 5 x 6 mm, demonstrating posterior acoustic enhancement and no internal power Doppler flow. On correlative physical examination, there is a pea-sized palpable superficial lump in the RIGHT mastectomy bed above the scar adjacent to the sternum. IMPRESSION: 1. No mammographic evidence of malignancy involving the LEFT breast. 2. No pathologic LEFT axillary lymphadenopathy. 3. Likely benign 8 mm oil cyst involving the superficial tissues of the RIGHT mastectomy bed superior to the scar and adjacent to the sternum which accounts for the palpable concern. RECOMMENDATION: 1. Ultrasound of the RIGHT mastectomy bed in 6 months. 2. LEFT mammography in 1 year. I have discussed the findings and recommendations with the patient. If applicable, a reminder letter will be sent to the patient regarding the next appointment. BI-RADS CATEGORY  3: Probably benign. Electronically Signed   By: Evangeline Dakin M.D.   On: 05/17/2021 16:33   ? ? ?Pelvic/Bimanual ?Pap is not indicated today per BCCCP guidelines. ?  ?Smoking History: ?Patient  is a current smoker. Discussed smoking cessation with patient. Referred to the [facility] Quitline. ?  ?Patient Navigation: ?Patient education  provided. Access to services provided for patient through [facility] program.  ? ?Colorectal Cancer Screening: ?Per patient had a colonoscopy completed 2 years ago. FIT Test given to patient to complete. No complaints today.  ?  ?Breast and Cervical Cancer Risk Assessment: ?Patient has family history of her maternal grandmother and a paternal aunt having breast cancer. Patient has a personal history of breast cancer. Patient has no known genetic mutations or history of radiation treatment to the chest before age 77. Per patient has history of cervical dysplasia. Patient has no history of being immunocompromised or DES exposure in-utero. ? ?Risk Assessment   ? ? Risk Scores   ? ?   05/17/2021  ? Last edited by: Royston Bake, CMA  ? 5-year risk: 0.8 %  ? Lifetime risk: 7.8 %  ? ?  ?  ? ?  ? ? ?A: ?BCCCP exam without pap smear ?Complaint of right breast and left axillary lump. ? ?P: ?Referred patient to the [facility] for a diagnostic mammogram. Appointment scheduled Thursday, May 17, 2021 at 1450. ? ?Loletta Parish, RN ?05/17/2021 1:45 PM   ?

## 2021-05-31 ENCOUNTER — Encounter (INDEPENDENT_AMBULATORY_CARE_PROVIDER_SITE_OTHER): Payer: 59

## 2021-05-31 ENCOUNTER — Encounter (HOSPITAL_BASED_OUTPATIENT_CLINIC_OR_DEPARTMENT_OTHER): Payer: Self-pay

## 2021-05-31 ENCOUNTER — Ambulatory Visit (INDEPENDENT_AMBULATORY_CARE_PROVIDER_SITE_OTHER): Payer: 59 | Admitting: Vascular Surgery

## 2021-05-31 ENCOUNTER — Other Ambulatory Visit: Payer: Self-pay

## 2021-05-31 ENCOUNTER — Emergency Department (HOSPITAL_BASED_OUTPATIENT_CLINIC_OR_DEPARTMENT_OTHER)
Admission: EM | Admit: 2021-05-31 | Discharge: 2021-06-01 | Disposition: A | Discharge status: Home / Self Care | Payer: 59 | Attending: Emergency Medicine | Admitting: Emergency Medicine

## 2021-05-31 DIAGNOSIS — Z853 Personal history of malignant neoplasm of breast: Secondary | ICD-10-CM | POA: Insufficient documentation

## 2021-05-31 DIAGNOSIS — L02211 Cutaneous abscess of abdominal wall: Secondary | ICD-10-CM | POA: Insufficient documentation

## 2021-05-31 LAB — COMPREHENSIVE METABOLIC PANEL
ALT: 13 U/L (ref 0–44)
AST: 17 U/L (ref 15–41)
Albumin: 4.6 g/dL (ref 3.5–5.0)
Alkaline Phosphatase: 50 U/L (ref 38–126)
Anion gap: 11 (ref 5–15)
BUN: 17 mg/dL (ref 6–20)
CO2: 26 mmol/L (ref 22–32)
Calcium: 10.3 mg/dL (ref 8.9–10.3)
Chloride: 101 mmol/L (ref 98–111)
Creatinine, Ser: 1.36 mg/dL — ABNORMAL HIGH (ref 0.44–1.00)
GFR, Estimated: 48 mL/min — ABNORMAL LOW (ref >60)
Glucose, Bld: 100 mg/dL — ABNORMAL HIGH (ref 70–99)
Potassium: 3.3 mmol/L — ABNORMAL LOW (ref 3.5–5.1)
Sodium: 138 mmol/L (ref 135–145)
Total Bilirubin: 0.5 mg/dL (ref 0.3–1.2)
Total Protein: 7.3 g/dL (ref 6.5–8.1)

## 2021-05-31 LAB — CBC WITH DIFFERENTIAL/PLATELET
Abs Immature Granulocytes: 0.01 10*3/uL (ref 0.00–0.07)
Basophils Absolute: 0 10*3/uL (ref 0.0–0.1)
Basophils Relative: 1 %
Eosinophils Absolute: 0.2 10*3/uL (ref 0.0–0.5)
Eosinophils Relative: 2 %
HCT: 39.3 % (ref 36.0–46.0)
Hemoglobin: 13.2 g/dL (ref 12.0–15.0)
Immature Granulocytes: 0 %
Lymphocytes Relative: 28 %
Lymphs Abs: 2.3 10*3/uL (ref 0.7–4.0)
MCH: 29.5 pg (ref 26.0–34.0)
MCHC: 33.6 g/dL (ref 30.0–36.0)
MCV: 87.9 fL (ref 80.0–100.0)
Monocytes Absolute: 0.5 10*3/uL (ref 0.1–1.0)
Monocytes Relative: 6 %
Neutro Abs: 5.2 10*3/uL (ref 1.7–7.7)
Neutrophils Relative %: 63 %
Platelets: 245 10*3/uL (ref 150–400)
RBC: 4.47 MIL/uL (ref 3.87–5.11)
RDW: 13.2 % (ref 11.5–15.5)
WBC: 8.2 10*3/uL (ref 4.0–10.5)
nRBC: 0 % (ref 0.0–0.2)

## 2021-05-31 MED ORDER — VANCOMYCIN HCL IN DEXTROSE 1-5 GM/200ML-% IV SOLN
1000.0000 mg | Freq: Once | INTRAVENOUS | Status: AC
  Administered 2021-05-31: 1000 mg via INTRAVENOUS
  Filled 2021-05-31: qty 200

## 2021-05-31 MED ORDER — SODIUM CHLORIDE 0.9 % IV BOLUS
1000.0000 mL | Freq: Once | INTRAVENOUS | Status: AC
  Administered 2021-05-31: 1000 mL via INTRAVENOUS

## 2021-05-31 NOTE — ED Triage Notes (Signed)
Pt arrives with several abscesses, worst one is on her abdomen, draining purulent drainage +erythema ?Pt is under the care Oncology for breast cancer and they instructed her to come here for IV antibiotics. ? ?  ?

## 2021-05-31 NOTE — ED Provider Notes (Signed)
? ?[facility] EM[facility] DEPT  ?Provider Note ? ?CSN: [id number] ?Arrival date & time: 05/31/21 1940 ? ?History ?Chief Complaint  ?Patient presents with  ? Abscess  ? ? ?Melissa Shepherd is a 51 y.o. female with history of breast cancer currently getting lupron and aromasin reports she has had an abscess on her lower abdomen for about 3 days, spontaneously draining. No associated fevers. After arrival here she noticed another smaller spot on her L buttock. She had a similar presentation for abscesses in other locations about 2 months ago that had not responded to oral doxycycline. She was admitted then at the advice of her oncologist, got one dose of Vancomycin and was discharged the following day on Bactrim which seemed to work better. She has not had any fevers with this episode. Has not spoken to any of her doctors about it.  ? ? ?Home Medications ?Prior to Admission medications   ?Medication Sig Start Date End Date Taking? Authorizing Provider  ?sulfamethoxazole-trimethoprim (BACTRIM DS) 800-160 MG tablet Take 1 tablet by mouth 2 (two) times daily for 7 days. 06/01/21 06/08/21 Yes Truddie Hidden, MD  ?ALPRAZolam Duanne Moron) 1 MG tablet Take 1 mg by mouth 3 (three) times daily.    [provider]  ?amLODipine (NORVASC) 10 MG tablet Take 10 mg by mouth daily. ?Patient not taking: Reported on 04/23/2021 03/16/21   [provider]  ?amphetamine-dextroamphetamine (ADDERALL) 20 MG tablet Take 20 mg by mouth 2 (two) times daily. 12/23/17   [provider]  ?ascorbic acid (VITAMIN C) 250 MG CHEW Chew 500 mg by mouth at bedtime.    [provider]  ?CALCIUM PO Take 600 mg by mouth at bedtime.    [provider]  ?Cholecalciferol (VITAMIN D-3) 125 MCG (5000 UT) TABS Take 2 tablets by mouth at bedtime.    [provider]  ?colestipol (COLESTID) 1 g tablet Take 2 g by mouth 2 (two) times daily. ?Patient not taking: Reported on 05/17/2021 02/01/21   [provider]  ?exemestane (AROMASIN) 25 MG tablet TAKE 1 TABLET (25 MG TOTAL) BY MOUTH DAILY. 04/27/21   Benay Pike, MD  ?folic acid (FOLVITE) 1 MG tablet Take 1 mg by mouth at bedtime. 12/29/20   [provider]  ?furosemide (LASIX) 20 MG tablet Take 20 mg by mouth daily as needed for fluid or edema.    [provider]  ?gabapentin (NEURONTIN) 300 MG capsule Take 300 mg by mouth 3 (three) times daily. 02/13/21   [provider]  ?hydrochlorothiazide (HYDRODIURIL) 12.5 MG tablet TAKE 1 TABLET BY MOUTH DAILY. 11/18/16   Leone Haven, MD  ?meloxicam (MOBIC) 15 MG tablet Take 15 mg by mouth as needed for pain.    [provider]  ?metoprolol succinate (TOPROL-XL) 100 MG 24 hr tablet Take 100 mg by mouth 2 (two) times daily. 03/12/21   [provider]  ?mupirocin ointment (BACTROBAN) 2 % Apply 1 application topically daily as needed (cyst). "cyst in panty line"    [provider]  ?nystatin (MYCOSTATIN) 100000 UNIT/ML suspension Use as directed 5 mLs (500,000 Units total) in the mouth or throat 4 (four) times daily as needed (Oral irritation or Oral thrush). 06/07/20   Scot Jun, FNP  ?potassium chloride SA (KLOR-CON M) 20 MEQ tablet Take 20 mEq by mouth at bedtime. 03/09/21   [provider]  ?rosuvastatin (CRESTOR) 40 MG tablet Take 40 mg by mouth at bedtime.    [provider]  ?  sertraline (ZOLOFT) 100 MG tablet Take 100 mg by mouth at bedtime.    [provider]  ? ? ? ?Allergies    ?Ezetimibe and Prednisone ? ? ?Review of Systems   ?Review of Systems ?Please see HPI for pertinent positives and negatives ? ?Physical Exam ?BP 98/67   Pulse (!) 57   Temp (!) 97.4 ?F (36.3 ?C) (Oral)   Resp 16   Ht '5\' 6"'$  (1.676 m)   Wt 85.3 kg   SpO2 95%   BMI 30.34 kg/m?  ? ?Physical Exam ?Vitals and nursing note reviewed.  ?HENT:  ?   Head: Normocephalic.  ?   Nose: Nose normal.  ?Eyes:  ?   Extraocular Movements: Extraocular  movements intact.  ?Pulmonary:  ?   Effort: Pulmonary effort is normal.  ?Abdominal:  ?   Comments: 4cm x 2cm ecchymotic lesion on lower abdomen with central ulcer, no focal fluctuance, moderate surrounding cellulitis. See photo. A similar, 1cm lesion is noted on her L buttock.   ?Musculoskeletal:     ?   General: Normal range of motion.  ?   Cervical back: Neck supple.  ?Skin: ?   Findings: No rash (on exposed skin).  ?Neurological:  ?   Mental Status: She is alert and oriented to person, place, and time.  ?Psychiatric:     ?   Mood and Affect: Mood normal.  ? ? ? ?ED Results / Procedures / Treatments   ?EKG ?None ? ?Procedures ?Procedures ? ?Medications Ordered in the ED ?Medications  ?vancomycin (VANCOCIN) IVPB 1000 mg/200 mL premix (0 mg Intravenous Stopped 06/01/21 0033)  ?sodium chloride 0.9 % bolus 1,000 mL (0 mLs Intravenous Stopped 06/01/21 0033)  ? ? ?Initial Impression and Plan ? Patient here with spontaneously draining abscess with surrounding cellulitis. She has no fever and no leukocytosis on CBC. CMP shows mild increased in Cr from baseline, but otherwise unremarkable. Given previous presentation, will give a dose of Vancomycin here and a liter of fluids. Anticipate she will be safe for discharge when complete with another course of oral Abx. ? ?ED Course  ? ?Clinical Course as of 06/01/21 0604  ?Fri Jun 01, 2021  ?0204 Patient continues to rest comfortably. BP is trending down while sleeping after taking one of her own Xanax. Will recheck temp but doubt this is due to sepsis without any other SIRS criteria. [CS]  ?0554 Patient continues to sleep soundly. Will plan discharge in AM when a ride is available to take her home or she is less sleepy from the Xanax and safe to drive herself.  [CS]  ?  ?Clinical Course User Index ?[CS] Truddie Hidden, MD  ? ? ? ?MDM Rules/Calculators/A&P ?Medical Decision Making ?Given presenting complaint, I considered that admission might be necessary. After review of  results from ED lab and/or imaging studies, admission to the hospital is not indicated at this time.  ? ? ?Problems Addressed: ?Abscess of abdominal wall: acute illness or injury ? ?Amount and/or Complexity of Data Reviewed ?Labs: ordered. Decision-making details documented in ED Course. ? ?Risk ?Prescription drug management. ?Decision regarding hospitalization. ? ? ? ?Final Clinical Impression(s) / ED Diagnoses ?Final diagnoses:  ?Abscess of abdominal wall  ? ? ?Rx / DC Orders ?ED Discharge Orders   ? ?      Ordered  ?  sulfamethoxazole-trimethoprim (BACTRIM DS) 800-160 MG tablet  2 times daily       ? 06/01/21 0603  ? ?  ?  ? ?  ? ?  ?  Truddie Hidden, MD ?06/01/21 [phone] ? ?

## 2021-06-01 MED ORDER — SULFAMETHOXAZOLE-TRIMETHOPRIM 800-160 MG PO TABS: 1.0000 | ORAL_TABLET | Freq: Two times a day (BID) | ORAL | 0 refills | Status: AC

## 2021-06-04 ENCOUNTER — Encounter: Payer: Self-pay | Admitting: Hematology and Oncology

## 2021-06-04 NOTE — Telephone Encounter (Signed)
This RN reviewed pt's call with MD. Recommendation given for her to continue current therapy- and best to follow up with primary MD. Note pt does not have a surgeon at present in this area ( surgery was done at [facility] ) and her insurance has changed. ? ?Per discussion pt verbalized understanding of recommendations. ?

## 2021-06-05 ENCOUNTER — Other Ambulatory Visit: Payer: Self-pay

## 2021-06-05 ENCOUNTER — Inpatient Hospital Stay: Payer: 59 | Attending: Hematology

## 2021-06-05 VITALS — BP 131/90 | HR 63 | Temp 98.3°F | Resp 18

## 2021-06-05 DIAGNOSIS — Z170 Estrogen receptor positive status [ER+]: Secondary | ICD-10-CM

## 2021-06-05 LAB — CULTURE, BLOOD (SINGLE)
Culture: NO GROWTH
Special Requests: ADEQUATE

## 2021-06-05 MED ORDER — LEUPROLIDE ACETATE 3.75 MG IM KIT
3.7500 mg | PACK | Freq: Once | INTRAMUSCULAR | Status: DC
  Filled 2021-06-05: qty 3.75

## 2021-06-07 ENCOUNTER — Encounter: Payer: Self-pay | Admitting: Hematology

## 2021-06-18 ENCOUNTER — Inpatient Hospital Stay (HOSPITAL_COMMUNITY)
Admission: EM | Admit: 2021-06-18 | Discharge: 2021-06-22 | DRG: 603 | Disposition: A | Discharge status: Home / Self Care | Payer: 59 | Attending: Family Medicine | Admitting: Family Medicine

## 2021-06-18 ENCOUNTER — Other Ambulatory Visit: Payer: Self-pay

## 2021-06-18 ENCOUNTER — Encounter (HOSPITAL_COMMUNITY): Payer: Self-pay

## 2021-06-18 DIAGNOSIS — Z791 Long term (current) use of non-steroidal anti-inflammatories (NSAID): Secondary | ICD-10-CM

## 2021-06-18 DIAGNOSIS — Z9011 Acquired absence of right breast and nipple: Secondary | ICD-10-CM

## 2021-06-18 DIAGNOSIS — Z885 Allergy status to narcotic agent status: Secondary | ICD-10-CM

## 2021-06-18 DIAGNOSIS — Z5189 Encounter for other specified aftercare: Principal | ICD-10-CM

## 2021-06-18 DIAGNOSIS — I82612 Acute embolism and thrombosis of superficial veins of left upper extremity: Secondary | ICD-10-CM | POA: Diagnosis present

## 2021-06-18 DIAGNOSIS — S31109A Unspecified open wound of abdominal wall, unspecified quadrant without penetration into peritoneal cavity, initial encounter: Secondary | ICD-10-CM | POA: Diagnosis present

## 2021-06-18 DIAGNOSIS — Z79899 Other long term (current) drug therapy: Secondary | ICD-10-CM

## 2021-06-18 DIAGNOSIS — L03311 Cellulitis of abdominal wall: Principal | ICD-10-CM | POA: Diagnosis present

## 2021-06-18 DIAGNOSIS — F17210 Nicotine dependence, cigarettes, uncomplicated: Secondary | ICD-10-CM | POA: Diagnosis present

## 2021-06-18 DIAGNOSIS — C50111 Malignant neoplasm of central portion of right female breast: Secondary | ICD-10-CM | POA: Diagnosis present

## 2021-06-18 DIAGNOSIS — N289 Disorder of kidney and ureter, unspecified: Secondary | ICD-10-CM

## 2021-06-18 DIAGNOSIS — B9561 Methicillin susceptible Staphylococcus aureus infection as the cause of diseases classified elsewhere: Secondary | ICD-10-CM | POA: Diagnosis present

## 2021-06-18 DIAGNOSIS — Z8614 Personal history of Methicillin resistant Staphylococcus aureus infection: Secondary | ICD-10-CM

## 2021-06-18 DIAGNOSIS — F410 Panic disorder [episodic paroxysmal anxiety] without agoraphobia: Secondary | ICD-10-CM | POA: Diagnosis present

## 2021-06-18 DIAGNOSIS — Z79811 Long term (current) use of aromatase inhibitors: Secondary | ICD-10-CM

## 2021-06-18 DIAGNOSIS — E782 Mixed hyperlipidemia: Secondary | ICD-10-CM | POA: Diagnosis present

## 2021-06-18 DIAGNOSIS — Z170 Estrogen receptor positive status [ER+]: Secondary | ICD-10-CM

## 2021-06-18 DIAGNOSIS — L0390 Cellulitis, unspecified: Secondary | ICD-10-CM | POA: Diagnosis present

## 2021-06-18 DIAGNOSIS — I10 Essential (primary) hypertension: Secondary | ICD-10-CM | POA: Diagnosis present

## 2021-06-18 DIAGNOSIS — F32A Depression, unspecified: Secondary | ICD-10-CM | POA: Diagnosis present

## 2021-06-18 DIAGNOSIS — Z888 Allergy status to other drugs, medicaments and biological substances status: Secondary | ICD-10-CM

## 2021-06-18 DIAGNOSIS — Z8249 Family history of ischemic heart disease and other diseases of the circulatory system: Secondary | ICD-10-CM

## 2021-06-18 DIAGNOSIS — K760 Fatty (change of) liver, not elsewhere classified: Secondary | ICD-10-CM | POA: Diagnosis present

## 2021-06-18 DIAGNOSIS — N2889 Other specified disorders of kidney and ureter: Secondary | ICD-10-CM | POA: Diagnosis present

## 2021-06-18 DIAGNOSIS — L98499 Non-pressure chronic ulcer of skin of other sites with unspecified severity: Secondary | ICD-10-CM | POA: Diagnosis present

## 2021-06-18 DIAGNOSIS — E876 Hypokalemia: Secondary | ICD-10-CM | POA: Diagnosis present

## 2021-06-18 DIAGNOSIS — Z803 Family history of malignant neoplasm of breast: Secondary | ICD-10-CM

## 2021-06-18 LAB — BASIC METABOLIC PANEL
Anion gap: 5 (ref 5–15)
BUN: 9 mg/dL (ref 6–20)
CO2: 28 mmol/L (ref 22–32)
Calcium: 9.3 mg/dL (ref 8.9–10.3)
Chloride: 107 mmol/L (ref 98–111)
Creatinine, Ser: 0.74 mg/dL (ref 0.44–1.00)
GFR, Estimated: >60 mL/min (ref >60)
Glucose, Bld: 85 mg/dL (ref 70–99)
Potassium: 3.2 mmol/L — ABNORMAL LOW (ref 3.5–5.1)
Sodium: 140 mmol/L (ref 135–145)

## 2021-06-18 LAB — CBC WITH DIFFERENTIAL/PLATELET
Abs Immature Granulocytes: 0.02 10*3/uL (ref 0.00–0.07)
Basophils Absolute: 0.1 10*3/uL (ref 0.0–0.1)
Basophils Relative: 1 %
Eosinophils Absolute: 0.2 10*3/uL (ref 0.0–0.5)
Eosinophils Relative: 2 %
HCT: 38.5 % (ref 36.0–46.0)
Hemoglobin: 13 g/dL (ref 12.0–15.0)
Immature Granulocytes: 0 %
Lymphocytes Relative: 41 %
Lymphs Abs: 3.3 10*3/uL (ref 0.7–4.0)
MCH: 30.5 pg (ref 26.0–34.0)
MCHC: 33.8 g/dL (ref 30.0–36.0)
MCV: 90.4 fL (ref 80.0–100.0)
Monocytes Absolute: 0.4 10*3/uL (ref 0.1–1.0)
Monocytes Relative: 5 %
Neutro Abs: 4.1 10*3/uL (ref 1.7–7.7)
Neutrophils Relative %: 51 %
Platelets: 204 10*3/uL (ref 150–400)
RBC: 4.26 MIL/uL (ref 3.87–5.11)
RDW: 13.2 % (ref 11.5–15.5)
WBC: 8 10*3/uL (ref 4.0–10.5)
nRBC: 0 % (ref 0.0–0.2)

## 2021-06-18 MED ORDER — VANCOMYCIN HCL 1750 MG/350ML IV SOLN
1750.0000 mg | Freq: Once | INTRAVENOUS | Status: AC
Start: 2021-06-18 — End: 2021-06-19
  Administered 2021-06-18: 1750 mg via INTRAVENOUS
  Filled 2021-06-18: qty 350

## 2021-06-18 MED ORDER — MORPHINE SULFATE (PF) 4 MG/ML IV SOLN
4.0000 mg | Freq: Once | INTRAVENOUS | Status: DC
  Filled 2021-06-18: qty 1

## 2021-06-18 MED ORDER — SODIUM CHLORIDE 0.9 % IV BOLUS
1000.0000 mL | Freq: Once | INTRAVENOUS | Status: AC
  Administered 2021-06-18: 1000 mL via INTRAVENOUS

## 2021-06-18 MED ORDER — KETOROLAC TROMETHAMINE 15 MG/ML IJ SOLN
15.0000 mg | Freq: Once | INTRAMUSCULAR | Status: AC
  Administered 2021-06-18: 15 mg via INTRAVENOUS
  Filled 2021-06-18: qty 1

## 2021-06-18 NOTE — Progress Notes (Signed)
A consult was received from an ED physician for [facility] per pharmacy dosing.  The patient's profile has been reviewed for ht/wt/allergies/indication/available labs.   ?A one time order has been placed for Vancomycin '1750mg'$  IV.  Further antibiotics/pharmacy consults should be ordered by admitting physician if indicated.       ?                ?Thank you, ?Netta Cedars PharmD ?06/18/2021  11:05 PM ? ?

## 2021-06-18 NOTE — ED Provider Notes (Addendum)
?[facility] DEPT ?Provider Note ? ? ?CSN: [id number] ?Arrival date & time: 06/18/21  1957 ? ?  ? ?History ? ?Chief Complaint  ?Patient presents with  ? wound  ? ? ?Melissa Shepherd is a 51 y.o. female. ? ?Melissa Shepherd is a 51 y.o. female with history of breast cancer currently getting lupron and aromasin presenting for abdominal wound infection. Pt admits to swelling, redness, and purulent drainage from lower abdominal wound that started 1 month ago. Denies fever or chills. No hx of diabetes. Has been seen in ED 05/31/21 and received one dose of vancoymcin IV in ED and sent home on bactrim.  Patient states she has been compliant on antibiotics.  ? ?Patient reports of hx of multiple non-healing, draining wounds previously on body including arm pit and leg/groin. Initially seen in urgent care 1/27 and prescribed 10-day course of doxycycline. Then was admitted on 2/4-2/5 and received vancomycin and discharged on Bactrim.  ? ? ?The history is provided by the patient. No language interpreter was used.  ? ?  ? ?Home Medications ?Prior to Admission medications   ?Medication Sig Start Date End Date Taking? Authorizing Provider  ?ALPRAZolam (XANAX) 1 MG tablet Take 1 mg by mouth 3 (three) times daily.    [provider]  ?amLODipine (NORVASC) 10 MG tablet Take 10 mg by mouth daily. ?Patient not taking: Reported on 04/23/2021 03/16/21   [provider]  ?amphetamine-dextroamphetamine (ADDERALL) 20 MG tablet Take 20 mg by mouth 2 (two) times daily. 12/23/17   [provider]  ?ascorbic acid (VITAMIN C) 250 MG CHEW Chew 500 mg by mouth at bedtime.    [provider]  ?CALCIUM PO Take 600 mg by mouth at bedtime.    [provider]  ?Cholecalciferol (VITAMIN D-3) 125 MCG (5000 UT) TABS Take 2 tablets by mouth at bedtime.    [provider]  ?colestipol (COLESTID) 1 g tablet Take 2 g by mouth 2 (two) times daily. ?Patient not taking: Reported on  05/17/2021 02/01/21   [provider]  ?exemestane (AROMASIN) 25 MG tablet TAKE 1 TABLET (25 MG TOTAL) BY MOUTH DAILY. 04/27/21   Benay Pike, MD  ?folic acid (FOLVITE) 1 MG tablet Take 1 mg by mouth at bedtime. 12/29/20   [provider]  ?furosemide (LASIX) 20 MG tablet Take 20 mg by mouth daily as needed for fluid or edema.    [provider]  ?gabapentin (NEURONTIN) 300 MG capsule Take 300 mg by mouth 3 (three) times daily. 02/13/21   [provider]  ?hydrochlorothiazide (HYDRODIURIL) 12.5 MG tablet TAKE 1 TABLET BY MOUTH DAILY. 11/18/16   Leone Haven, MD  ?meloxicam (MOBIC) 15 MG tablet Take 15 mg by mouth as needed for pain.    [provider]  ?metoprolol succinate (TOPROL-XL) 100 MG 24 hr tablet Take 100 mg by mouth 2 (two) times daily. 03/12/21   [provider]  ?mupirocin ointment (BACTROBAN) 2 % Apply 1 application topically daily as needed (cyst). "cyst in panty line"    [provider]  ?nystatin (MYCOSTATIN) 100000 UNIT/ML suspension Use as directed 5 mLs (500,000 Units total) in the mouth or throat 4 (four) times daily as needed (Oral irritation or Oral thrush). 06/07/20   Scot Jun, FNP  ?potassium chloride SA (KLOR-CON M) 20 MEQ tablet Take 20 mEq by mouth at bedtime. 03/09/21   [provider]  ?rosuvastatin (CRESTOR) 40 MG tablet Take 40 mg by mouth at  bedtime.    [provider]  ?sertraline (ZOLOFT) 100 MG tablet Take 100 mg by mouth at bedtime.    [provider]  ?   ? ?Allergies    ?Ezetimibe and Prednisone   ? ?Review of Systems   ?Review of Systems  ?Constitutional:  Negative for chills and fever.  ?HENT:  Negative for ear pain and sore throat.   ?Eyes:  Negative for pain and visual disturbance.  ?Respiratory:  Negative for cough and shortness of breath.   ?Cardiovascular:  Negative for chest pain and palpitations.  ?Gastrointestinal:  Negative for abdominal pain and vomiting.   ?Genitourinary:  Negative for dysuria and hematuria.  ?Musculoskeletal:  Negative for arthralgias and back pain.  ?Skin:  Positive for wound. Negative for color change and rash.  ?Neurological:  Negative for seizures and syncope.  ?All other systems reviewed and are negative. ? ?Physical Exam ?Updated Vital Signs ?BP (!) 133/102   Pulse (!) 50   Temp 98.3 ?F (36.8 ?C) (Oral)   Resp 12   Ht '5\' 6"'$  (1.676 m)   Wt 83.9 kg   SpO2 99%   BMI 29.86 kg/m?  ?Physical Exam ?Vitals and nursing note reviewed.  ?Constitutional:   ?   General: She is not in acute distress. ?   Appearance: She is well-developed.  ?HENT:  ?   Head: Normocephalic and atraumatic.  ?Eyes:  ?   Conjunctiva/sclera: Conjunctivae normal.  ?Cardiovascular:  ?   Rate and Rhythm: Normal rate and regular rhythm.  ?   Heart sounds: No murmur heard. ?Pulmonary:  ?   Effort: Pulmonary effort is normal. No respiratory distress.  ?   Breath sounds: Normal breath sounds.  ?Abdominal:  ?   Palpations: Abdomen is soft.  ?   Tenderness: There is abdominal tenderness.  ? ? ?Musculoskeletal:     ?   General: No swelling.  ?   Cervical back: Neck supple.  ?Skin: ?   General: Skin is warm and dry.  ?   Capillary Refill: Capillary refill takes less than 2 seconds.  ?   Findings: Wound present.  ?Neurological:  ?   Mental Status: She is alert.  ?Psychiatric:     ?   Mood and Affect: Mood normal.  ? ? ? ? ? ? ? ? ? ? ?ED Results / Procedures / Treatments   ?Labs ?(all labs ordered are listed, but only abnormal results are displayed) ?Labs Reviewed - No data to display ? ?EKG ?None ? ?Radiology ?No results found. ? ?Procedures ?Procedures  ? ? ?Medications Ordered in ED ?Medications - No data to display ? ?ED Course/ Medical Decision Making/ A&P ?  ?                        ?Medical Decision Making ?Amount and/or Complexity of Data Reviewed ?Labs: ordered. ? ?Risk ?Prescription drug management. ? ? ?Melissa Shepherd is a 51 y.o. female with history of breast cancer  currently getting lupron and aromasin presenting for abdominal wound infection.  Patient is alert oriented x3, no acute distress, afebrile, stable vital signs.  No signs or symptoms of sepsis.  Please see attached photos of wound. ? ?Recommending admission for multiple rounds of failed outpatient antibiotics for abdominal wounds.  The fluids and vancomycin given.  Of note, patient did have remote history of MRSA several years ago.  She has multiple abscesses in inguinal cleft and armpits.  Concern for hidradenitis suprarita.  ? ?  I spoke with Dr. Wilber Bihari who agrees to accept patient.  ? ? ? ?Final Clinical Impression(s) / ED Diagnoses ?Final diagnoses:  ?Wound check, abscess  ? ? ?Rx / DC Orders ?ED Discharge Orders   ? ? None  ? ?  ? ? ?  ?Lianne Cure, DO ?97/47/18 2312 ? ?  ?Lianne Cure, DO ?55/01/58 2318 ? ?

## 2021-06-18 NOTE — ED Triage Notes (Signed)
Pt reports with a wound to her left lower abdomen x 5 days. Pt states that she has been getting these wounds since January.  ?

## 2021-06-19 ENCOUNTER — Observation Stay (HOSPITAL_COMMUNITY): Payer: 59

## 2021-06-19 ENCOUNTER — Encounter (HOSPITAL_COMMUNITY): Payer: Self-pay | Admitting: Internal Medicine

## 2021-06-19 DIAGNOSIS — E782 Mixed hyperlipidemia: Secondary | ICD-10-CM

## 2021-06-19 DIAGNOSIS — E876 Hypokalemia: Secondary | ICD-10-CM

## 2021-06-19 DIAGNOSIS — Z170 Estrogen receptor positive status [ER+]: Secondary | ICD-10-CM

## 2021-06-19 DIAGNOSIS — C50111 Malignant neoplasm of central portion of right female breast: Secondary | ICD-10-CM

## 2021-06-19 DIAGNOSIS — L03311 Cellulitis of abdominal wall: Secondary | ICD-10-CM | POA: Diagnosis not present

## 2021-06-19 DIAGNOSIS — N289 Disorder of kidney and ureter, unspecified: Secondary | ICD-10-CM | POA: Diagnosis not present

## 2021-06-19 DIAGNOSIS — S31109A Unspecified open wound of abdominal wall, unspecified quadrant without penetration into peritoneal cavity, initial encounter: Secondary | ICD-10-CM

## 2021-06-19 DIAGNOSIS — I10 Essential (primary) hypertension: Secondary | ICD-10-CM | POA: Diagnosis not present

## 2021-06-19 LAB — CBC WITH DIFFERENTIAL/PLATELET
Abs Immature Granulocytes: 0.01 10*3/uL (ref 0.00–0.07)
Basophils Absolute: 0 10*3/uL (ref 0.0–0.1)
Basophils Relative: 1 %
Eosinophils Absolute: 0.1 10*3/uL (ref 0.0–0.5)
Eosinophils Relative: 2 %
HCT: 39.9 % (ref 36.0–46.0)
Hemoglobin: 13.2 g/dL (ref 12.0–15.0)
Immature Granulocytes: 0 %
Lymphocytes Relative: 40 %
Lymphs Abs: 2.4 10*3/uL (ref 0.7–4.0)
MCH: 29.9 pg (ref 26.0–34.0)
MCHC: 33.1 g/dL (ref 30.0–36.0)
MCV: 90.3 fL (ref 80.0–100.0)
Monocytes Absolute: 0.4 10*3/uL (ref 0.1–1.0)
Monocytes Relative: 6 %
Neutro Abs: 3 10*3/uL (ref 1.7–7.7)
Neutrophils Relative %: 51 %
Platelets: 200 10*3/uL (ref 150–400)
RBC: 4.42 MIL/uL (ref 3.87–5.11)
RDW: 13.2 % (ref 11.5–15.5)
WBC: 5.9 10*3/uL (ref 4.0–10.5)
nRBC: 0 % (ref 0.0–0.2)

## 2021-06-19 LAB — COMPREHENSIVE METABOLIC PANEL
ALT: 19 U/L (ref 0–44)
AST: 19 U/L (ref 15–41)
Albumin: 3.8 g/dL (ref 3.5–5.0)
Alkaline Phosphatase: 41 U/L (ref 38–126)
Anion gap: 6 (ref 5–15)
BUN: 9 mg/dL (ref 6–20)
CO2: 26 mmol/L (ref 22–32)
Calcium: 8.9 mg/dL (ref 8.9–10.3)
Chloride: 110 mmol/L (ref 98–111)
Creatinine, Ser: 0.58 mg/dL (ref 0.44–1.00)
GFR, Estimated: >60 mL/min (ref >60)
Glucose, Bld: 88 mg/dL (ref 70–99)
Potassium: 3.6 mmol/L (ref 3.5–5.1)
Sodium: 142 mmol/L (ref 135–145)
Total Bilirubin: 0.6 mg/dL (ref 0.3–1.2)
Total Protein: 6 g/dL — ABNORMAL LOW (ref 6.5–8.1)

## 2021-06-19 LAB — MAGNESIUM: Magnesium: 1.8 mg/dL (ref 1.7–2.4)

## 2021-06-19 LAB — C-REACTIVE PROTEIN: CRP: 0.5 mg/dL (ref <1.0)

## 2021-06-19 MED ORDER — OXYCODONE-ACETAMINOPHEN 5-325 MG PO TABS
1.0000 | ORAL_TABLET | ORAL | Status: DC | PRN
Start: 2021-06-19 — End: 2021-06-22
  Administered 2021-06-19 – 2021-06-22 (×9): 1 via ORAL
  Filled 2021-06-19 (×9): qty 1

## 2021-06-19 MED ORDER — ACETAMINOPHEN 650 MG RE SUPP: 650.0000 mg | Freq: Four times a day (QID) | RECTAL | Status: DC | PRN

## 2021-06-19 MED ORDER — POLYETHYLENE GLYCOL 3350 17 G PO PACK: 17.0000 g | PACK | Freq: Every day | ORAL | Status: DC | PRN

## 2021-06-19 MED ORDER — NICOTINE 14 MG/24HR TD PT24
14.0000 mg | MEDICATED_PATCH | Freq: Every day | TRANSDERMAL | Status: DC
Start: 2021-06-19 — End: 2021-06-22
  Administered 2021-06-19 – 2021-06-22 (×4): 14 mg via TRANSDERMAL
  Filled 2021-06-19 (×4): qty 1

## 2021-06-19 MED ORDER — ROSUVASTATIN CALCIUM 10 MG PO TABS
40.0000 mg | ORAL_TABLET | Freq: Every day | ORAL | Status: DC
Start: 2021-06-19 — End: 2021-06-22
  Administered 2021-06-19 – 2021-06-21 (×3): 40 mg via ORAL
  Filled 2021-06-19 (×3): qty 4

## 2021-06-19 MED ORDER — AMLODIPINE BESYLATE 10 MG PO TABS: 10.0000 mg | ORAL_TABLET | Freq: Every day | ORAL | Status: DC

## 2021-06-19 MED ORDER — MORPHINE SULFATE (PF) 4 MG/ML IV SOLN: 4.0000 mg | INTRAVENOUS | Status: DC | PRN

## 2021-06-19 MED ORDER — VANCOMYCIN HCL IN DEXTROSE 1-5 GM/200ML-% IV SOLN
1000.0000 mg | Freq: Two times a day (BID) | INTRAVENOUS | Status: DC
  Administered 2021-06-19 – 2021-06-20 (×3): 1000 mg via INTRAVENOUS
  Filled 2021-06-19 (×3): qty 200

## 2021-06-19 MED ORDER — IOHEXOL 300 MG/ML  SOLN
100.0000 mL | Freq: Once | INTRAMUSCULAR | Status: AC | PRN
  Administered 2021-06-19: 100 mL via INTRAVENOUS

## 2021-06-19 MED ORDER — LORATADINE 10 MG PO TABS
10.0000 mg | ORAL_TABLET | Freq: Every day | ORAL | Status: DC
  Administered 2021-06-19 – 2021-06-22 (×4): 10 mg via ORAL
  Filled 2021-06-19 (×4): qty 1

## 2021-06-19 MED ORDER — HYDROCHLOROTHIAZIDE 12.5 MG PO TABS
12.5000 mg | ORAL_TABLET | Freq: Every day | ORAL | Status: DC
  Administered 2021-06-19 – 2021-06-22 (×4): 12.5 mg via ORAL
  Filled 2021-06-19 (×4): qty 1

## 2021-06-19 MED ORDER — POTASSIUM CHLORIDE CRYS ER 20 MEQ PO TBCR
40.0000 meq | EXTENDED_RELEASE_TABLET | Freq: Once | ORAL | Status: AC
  Administered 2021-06-19: 40 meq via ORAL
  Filled 2021-06-19: qty 2

## 2021-06-19 MED ORDER — EXEMESTANE 25 MG PO TABS
25.0000 mg | ORAL_TABLET | Freq: Every day | ORAL | Status: DC
Start: 2021-06-19 — End: 2021-06-22
  Administered 2021-06-19 – 2021-06-22 (×4): 25 mg via ORAL
  Filled 2021-06-19 (×4): qty 1

## 2021-06-19 MED ORDER — HYDRALAZINE HCL 20 MG/ML IJ SOLN: 10.0000 mg | Freq: Four times a day (QID) | INTRAMUSCULAR | Status: DC | PRN

## 2021-06-19 MED ORDER — ONDANSETRON HCL 4 MG/2ML IJ SOLN: 4.0000 mg | Freq: Four times a day (QID) | INTRAMUSCULAR | Status: DC | PRN

## 2021-06-19 MED ORDER — ENOXAPARIN SODIUM 40 MG/0.4ML IJ SOSY
40.0000 mg | PREFILLED_SYRINGE | INTRAMUSCULAR | Status: DC
  Filled 2021-06-19: qty 0.4

## 2021-06-19 MED ORDER — ALPRAZOLAM 1 MG PO TABS
1.0000 mg | ORAL_TABLET | Freq: Three times a day (TID) | ORAL | Status: DC | PRN
  Administered 2021-06-19 – 2021-06-22 (×7): 1 mg via ORAL
  Filled 2021-06-19 (×7): qty 1

## 2021-06-19 MED ORDER — COLESTIPOL HCL 1 G PO TABS
2.0000 g | ORAL_TABLET | Freq: Two times a day (BID) | ORAL | Status: DC
  Administered 2021-06-19 – 2021-06-22 (×5): 2 g via ORAL
  Filled 2021-06-19 (×6): qty 2

## 2021-06-19 MED ORDER — ACETAMINOPHEN 325 MG PO TABS
650.0000 mg | ORAL_TABLET | Freq: Four times a day (QID) | ORAL | Status: DC | PRN
  Administered 2021-06-19: 650 mg via ORAL
  Filled 2021-06-19 (×2): qty 2

## 2021-06-19 MED ORDER — ONDANSETRON HCL 4 MG PO TABS: 4.0000 mg | ORAL_TABLET | Freq: Four times a day (QID) | ORAL | Status: DC | PRN

## 2021-06-19 NOTE — Consult Note (Addendum)
WOC Nurse Consult Note: ?Reason for Consult: LLQ full thickness lesion, infectious ?Wound type: etiology not known, patient reports previous similar lesions on the LLE x2, left axillae and buttocks since January 2023. Oncology and PCP have recommended Infectious Disease consult, patient has been awaiting a call back to schedule an appointment. ?Pressure Injury POA: N/A ?Measurement: 1.5cm x 3cm x 0.3cm ?Wound bed: 70% red, 30% yellow ?Drainage (amount, consistency, odor) moderate light yellow ?Periwound: erythematous to 1cm circumferentially ?Dressing procedure/placement/frequency: Topical care will be to cleanse daily with soap and water, rinse and pat gently dry. Cover lesion with a size appropriate piece of Aquacel Ag+ Sheliah Hatch # [id number].  Top with dry gauze, ABD, paper tape. Change daily.  ? ?Recommend consultation with ID Provider. If you agree, please order/arrange consultation, either in house or after discharge. ? ?[facility] nursing team will not follow, but will remain available to this patient, the nursing and medical teams.  Please re-consult if needed. ?Thanks, ?Maudie Flakes, MSN, RN, [place], [place], CWON-AP, [place]  ?Pager# [phone]  ? ? ? ?  ?

## 2021-06-19 NOTE — Plan of Care (Signed)

## 2021-06-19 NOTE — Assessment & Plan Note (Signed)
?   Likely secondary to poor oral intake in the setting of thiazide use ?? Replacing with oral potassium chloride ?

## 2021-06-19 NOTE — Progress Notes (Signed)
Patient was transferred to [place] at [place]. Alert and oriented x4. Wound is accessed and dressing applied.  ?

## 2021-06-19 NOTE — Assessment & Plan Note (Signed)
.   Continuing home regimen of lipid lowering therapy.  

## 2021-06-19 NOTE — H&P (Signed)
?History and Physical  ? ? ?Patient: Melissa Shepherd MRN: [id number] DOA: 06/18/2021 ? ?Date of Service: the patient was seen and examined on 06/19/2021 ? ?Patient coming from: Home ? ?Chief Complaint:  ?Chief Complaint  ?Patient presents with  ? wound  ? ? ?HPI:  ? 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 ? ?Of note, patient had an elevated an area of redness swelling and tenderness in her lower abdomen the first week of April.  She had presented to [place] for evaluation at that time, received a dose of intravenous vancomycin and was discharged home on a course of Bactrim. ? ?Patient explains that despite this course of Bactrim she experienced worsening and enlargement of the abdominal wound that did not resolve with Bactrim therapy.  Patient explains that the wound has been enlarging and has been actively draining yellow drainage.  This is associated with moderate to severe pain of the wound, radiating diffusely, worse with movement and improved with rest.  Patient denies any associated fever but does complain of some associated generalized weakness and malaise and decreased appetite.  Patient denies any sick contacts or recent travel. ? ?Patient symptoms continue to worsen until she eventually presented to [facility] long hospital for evaluation.  Upon evaluation in the emergency department patient was found to be suffering from ongoing ulceration and abdominal wall cellulitis.  Patient was administered intravenous vancomycin and the hospitalist group was then called to assess the patient for admission the hospital. ? ?Review of Systems: Review of Systems  ?Gastrointestinal:  Positive for abdominal pain.  ?Skin:   ?     Draining skin wound   ?Neurological:  Positive for weakness.  ?All other systems reviewed and are negative. ? ? ?Past Medical History:  ?Diagnosis Date  ? Anginal pain ([place])   ? Anxiety   ? panic attacks  ? Cancer [facility])   ? Chicken pox   ? Cholelithiasis with acute on chronic cholecystitis without biliary obstruction 11/26/2016  ? Depression   ? Diarrhea   ? Dyspnea   ? with chest pain  ? Fatty liver   ? Frequent headaches   ? Hypertension   ? IBS (irritable bowel syndrome)   ? Multiple thyroid nodules   ? Seizures ([place])   ? pregenancy- toxemia- 1991  ? ? ?Past Surgical History:  ?Procedure Laterality Date  ? CESAREAN SECTION  1991. 2003.  ? 2  ? CHOLECYSTECTOMY N/A 11/26/2016  ? Procedure: LAPAROSCOPIC CHOLECYSTECTOMY WITH INTRAOPERATIVE CHOLANGIOGRAM;  Surgeon: Fanny Skates, MD;  Location: [facility];  Service: General;  Laterality: N/A;  ? MASTECTOMY Right   ? WISDOM TOOTH EXTRACTION    ? ? ?Social History:  reports that she has been smoking cigarettes. She has a 7.75 pack-year smoking history. She has never used smokeless tobacco. She reports that she does not currently use alcohol after a past usage of about 2.0 standard drinks per week. She reports that she does not use drugs. ? ?Allergies  ?Allergen Reactions  ? Ezetimibe Other (See Comments)  ?  Patient does not report what actually happens with Zetia ?Patient does not report what actually happens with Zetia ?  ? Prednisone Anxiety  ?  Other reaction(s): Other, Other (See Comments), Psychosis (intolerance), Unknown  ? ? ?Family History  ?  Problem Relation Age of Onset  ? Hypertension Mother   ? Cancer Sister   ?     cervical  ? Breast cancer Paternal Aunt   ? Breast cancer Maternal Grandmother   ? Cancer Maternal Grandmother   ?     Breast  ? ? ?Prior to Admission medications   ?Medication Sig Start Date End Date Taking? Authorizing Provider  ?ALPRAZolam (XANAX) 1 MG tablet Take 1 mg by mouth 3 (three) times daily.    [provider]  ?amLODipine (NORVASC) 10 MG tablet Take  10 mg by mouth daily. ?Patient not taking: Reported on 04/23/2021 03/16/21   [provider]  ?amphetamine-dextroamphetamine (ADDERALL) 20 MG tablet Take 20 mg by mouth 2 (two) times daily. 12/23/17   [provider]  ?ascorbic acid (VITAMIN C) 250 MG CHEW Chew 500 mg by mouth at bedtime.    [provider]  ?CALCIUM PO Take 600 mg by mouth at bedtime.    [provider]  ?Cholecalciferol (VITAMIN D-3) 125 MCG (5000 UT) TABS Take 2 tablets by mouth at bedtime.    [provider]  ?colestipol (COLESTID) 1 g tablet Take 2 g by mouth 2 (two) times daily. ?Patient not taking: Reported on 05/17/2021 02/01/21   [provider]  ?exemestane (AROMASIN) 25 MG tablet TAKE 1 TABLET (25 MG TOTAL) BY MOUTH DAILY. 04/27/21   Benay Pike, MD  ?folic acid (FOLVITE) 1 MG tablet Take 1 mg by mouth at bedtime. 12/29/20   [provider]  ?furosemide (LASIX) 20 MG tablet Take 20 mg by mouth daily as needed for fluid or edema.    [provider]  ?gabapentin (NEURONTIN) 300 MG capsule Take 300 mg by mouth 3 (three) times daily. 02/13/21   [provider]  ?hydrochlorothiazide (HYDRODIURIL) 12.5 MG tablet TAKE 1 TABLET BY MOUTH DAILY. 11/18/16   Leone Haven, MD  ?meloxicam (MOBIC) 15 MG tablet Take 15 mg by mouth as needed for pain.    [provider]  ?metoprolol succinate (TOPROL-XL) 100 MG 24 hr tablet Take 100 mg by mouth 2 (two) times daily. 03/12/21   [provider]  ?mupirocin ointment (BACTROBAN) 2 % Apply 1 application topically daily as needed (cyst). "cyst in panty line"    [provider]  ?nystatin (MYCOSTATIN) 100000 UNIT/ML suspension Use as directed 5 mLs (500,000 Units total) in the mouth or throat 4 (four) times daily as needed (Oral irritation or Oral thrush). 06/07/20   Scot Jun, FNP  ?potassium chloride SA (KLOR-CON M) 20 MEQ tablet Take 20 mEq by mouth at bedtime. 03/09/21   [provider]   ?rosuvastatin (CRESTOR) 40 MG tablet Take 40 mg by mouth at bedtime.    [provider]  ?sertraline (ZOLOFT) 100 MG tablet Take 100 mg by mouth at bedtime.    [provider]  ? ? ?Physical Exam: ? ?Vitals:  ? 06/19/21 0033 06/19/21 0103 06/19/21 0401 06/19/21 6301  ?BP: (!) 147/92 133/84 130/85 (!) 143/92  ?Pulse: 63 (!) 58 (!) 45 (!) 49  ?Resp: '19 18 16 18  '$ ?Temp:  97.8 ?F (36.6 ?C) 97.9 ?F (36.6 ?C) 97.7 ?F (36.5 ?C)  ?TempSrc:  Oral Oral   ?SpO2: 100% 100% 99% 98%  ?Weight:      ?Height:      ? ? ?Constitutional: Awake alert and oriented x3, no associated distress.   ?Skin: Approximately 4 cm diameter shallow ulceration on the lower anterior abdomen with surrounding induration  and warmth.  Slightly poor skin turgor noted otherwise.  Notable areas of scarring axilla and underneath the pannus.  Eyes: Pupils are equally reactive to light.  No evidence of scleral icterus or conjunctival pallor.  ?ENMT: Moist mucous membranes noted.  Posterior pharynx clear of any exudate or lesions.   ?Neck: normal, supple, no masses, no thyromegaly.  No evidence of jugular venous distension.   ?Respiratory: clear to auscultation bilaterally, no wheezing, no crackles. Normal respiratory effort. No accessory muscle use.  ?Cardiovascular: Regular rate and rhythm, no murmurs / rubs / gallops. No extremity edema. 2+ pedal pulses. No carotid bruits.  ?Chest:   Nontender without crepitus or deformity.   ?Back:   Nontender without crepitus or deformity. ?Abdomen: Abdomen is soft and nontender.  No evidence of intra-abdominal masses.  Positive bowel sounds noted in all quadrants.   ?Musculoskeletal: No joint deformity upper and lower extremities. Good ROM, no contractures. Normal muscle tone.  ?Neurologic: CN 2-12 grossly intact. Sensation intact.  Patient moving all 4 extremities spontaneously.  Patient is following all commands.  Patient is responsive to verbal stimuli.   ?Psychiatric: Sad mood with labile affect.   Patient seems to possess insight as to their current situation.   ? ?Data Reviewed: ? ?I have personally reviewed and interpreted labs, imaging. ? ?Significant findings are 2.0, hemoglobin 13, hematocrit 38.

## 2021-06-19 NOTE — Assessment & Plan Note (Addendum)
?   Persisting lower abdominal wall cellulitis with area of central ulceration has failed an outpatient course of Bactrim ?? CT imaging of the abdomen and pelvis has been obtained to ensure that there is no extension of infection into the deep tissues.  There is no evidence of underlying abscess formation. ?? Initially patient was thought to have a history of hidradenitis suppurativa however upon further questioning and review of records patient has never been formally diagnosed with this as far as I can tell.  On exam, there are some areas of scarring that could be related to a mild case of hidradenitis but I do not believe that that is necessarily the cause of patient's abdominal wall cellulitis/ulceration here especially based on review of images of the infected area from 4/6. ?? ER providers initiated intravenous vancomycin which will be continued at this time ?? As needed opiate-based analgesics for associated substantial pain ?? Blood cultures ordered ?? CRP ordered ?? De-escalate antibiotics based on culture results ?? If patient fails to rapidly improve will consider infectious disease consultation as patient has had multiple skin infections over the past several months. ?

## 2021-06-19 NOTE — Assessment & Plan Note (Signed)
.   Resume patients home regimen of oral antihypertensives . Titrate antihypertensive regimen as necessary to achieve adequate BP control . PRN intravenous antihypertensives for excessively elevated blood pressure   

## 2021-06-19 NOTE — Progress Notes (Signed)
[facility] ?PLAN OF CARE NOTE ?Patient: Melissa Shepherd WUX:[id number]   ?PCP: Remi Haggard, FNP DOB: [birth year]   ?DOA: 06/18/2021   DOS: 06/19/2021   ? ?Patient was admitted by my colleague earlier on 06/19/2021. I have reviewed the H&P as well as assessment and plan and agree with the same. ?Important changes in the plan are listed below. ? ?Plan of care: ?Principal Problem: ?  Abdominal wall cellulitis ?Active Problems: ?  Open abdominal wall wound ?  Lesion of left native kidney ?  Hypokalemia ?  Essential hypertension ?  Mixed hyperlipidemia ?  Malignant neoplasm of central portion of right breast in female, estrogen receptor positive ([place]) ?  ?I have consulted ID who will follow-up with the patient in the morning. ? ?Author: ?Berle Mull, MD ?[facility] ?06/19/2021 6:16 PM   ?If 7PM-7AM, please contact night-coverage at [email or web address] ?   ?

## 2021-06-19 NOTE — Assessment & Plan Note (Signed)
?   Patient receiving outpatient monthly Lupron injections ?? Patient additionally receiving daily Aromasin tablets ?? Continue outpatient follow-up ?

## 2021-06-19 NOTE — Assessment & Plan Note (Addendum)
?   Incidental finding, possibly malignant.   ?? Arrangement will be made for outpatient urologic consultation and MRI prior to discharge. ?

## 2021-06-19 NOTE — Assessment & Plan Note (Addendum)
?   Additionally ordering wound care consultation as aggressive wound care is extremely important ensuring patient's infection resolves. ?? Dressing changes as per their recommendations. ?

## 2021-06-19 NOTE — Progress Notes (Signed)
Pharmacy Antibiotic Note ? ?Melissa Shepherd is a 51 y.o. female admitted on 06/18/2021 with wound on abdomen. Purulent drainage present. Patient with history of similar wounds and MRSA infection in past. Infectious disease consult pending. Pharmacy has been consulted for vancomycin dosing. ? ?Plan: ?Vancomycin 1750 mg IV x 1 given in the ED. Start vancomycin 1000 mg IV q12h ?Goal AUC 400-550 ?Expected AUC: 469 ?SCr used: 0.8 ? ?Continue to follow renal function, culture data and clinical course for necessary antibiotic adjustments ? ? ?Height: '5\' 6"'$  (167.6 cm) ?Weight: 83.9 kg (185 lb) ?IBW/kg (Calculated) : 59.3 ? ?Temp (24hrs), Avg:98 ?F (36.7 ?C), Min:97.7 ?F (36.5 ?C), Max:98.3 ?F (36.8 ?C) ? ?Recent Labs  ?Lab 06/18/21 ?2303 06/19/21 ?1497  ?WBC 8.0 5.9  ?CREATININE 0.74 0.58  ?  ?Estimated Creatinine Clearance: 92.8 mL/min (by C-G formula based on SCr of 0.58 mg/dL).   ? ?Allergies  ?Allergen Reactions  ? Ezetimibe Other (See Comments)  ?  Patient does not report what actually happens with Zetia ?Patient does not report what actually happens with Zetia ?  ? Prednisone Anxiety  ?  Other reaction(s): Other, Other (See Comments), Psychosis (intolerance), Unknown  ? ? ?Antimicrobials this admission: ?Vancomycin 4/25 >> ? ?Dose adjustments this admission: NA ? ?Microbiology results: ?4/25 BCx: pending ?4/25 Cx (wound): ordered ? ?Thank you for allowing pharmacy to be a part of this patient?s care. ? ?Tawnya Crook, PharmD, BCPS ?Clinical Pharmacist ?06/19/2021 11:24 AM ? ? ?

## 2021-06-20 ENCOUNTER — Inpatient Hospital Stay (HOSPITAL_COMMUNITY): Payer: 59

## 2021-06-20 DIAGNOSIS — L0390 Cellulitis, unspecified: Secondary | ICD-10-CM | POA: Diagnosis present

## 2021-06-20 DIAGNOSIS — E876 Hypokalemia: Secondary | ICD-10-CM | POA: Diagnosis not present

## 2021-06-20 DIAGNOSIS — I10 Essential (primary) hypertension: Secondary | ICD-10-CM | POA: Diagnosis not present

## 2021-06-20 DIAGNOSIS — L98499 Non-pressure chronic ulcer of skin of other sites with unspecified severity: Secondary | ICD-10-CM | POA: Diagnosis present

## 2021-06-20 DIAGNOSIS — F17210 Nicotine dependence, cigarettes, uncomplicated: Secondary | ICD-10-CM | POA: Diagnosis present

## 2021-06-20 DIAGNOSIS — Z79899 Other long term (current) drug therapy: Secondary | ICD-10-CM | POA: Diagnosis not present

## 2021-06-20 DIAGNOSIS — Z791 Long term (current) use of non-steroidal anti-inflammatories (NSAID): Secondary | ICD-10-CM | POA: Diagnosis not present

## 2021-06-20 DIAGNOSIS — L03311 Cellulitis of abdominal wall: Secondary | ICD-10-CM | POA: Diagnosis not present

## 2021-06-20 DIAGNOSIS — Z79811 Long term (current) use of aromatase inhibitors: Secondary | ICD-10-CM | POA: Diagnosis not present

## 2021-06-20 DIAGNOSIS — Z8614 Personal history of Methicillin resistant Staphylococcus aureus infection: Secondary | ICD-10-CM | POA: Diagnosis not present

## 2021-06-20 DIAGNOSIS — Z888 Allergy status to other drugs, medicaments and biological substances status: Secondary | ICD-10-CM | POA: Diagnosis not present

## 2021-06-20 DIAGNOSIS — Z803 Family history of malignant neoplasm of breast: Secondary | ICD-10-CM | POA: Diagnosis not present

## 2021-06-20 DIAGNOSIS — B9561 Methicillin susceptible Staphylococcus aureus infection as the cause of diseases classified elsewhere: Secondary | ICD-10-CM | POA: Diagnosis present

## 2021-06-20 DIAGNOSIS — F32A Depression, unspecified: Secondary | ICD-10-CM | POA: Diagnosis present

## 2021-06-20 DIAGNOSIS — Z885 Allergy status to narcotic agent status: Secondary | ICD-10-CM | POA: Diagnosis not present

## 2021-06-20 DIAGNOSIS — S31109A Unspecified open wound of abdominal wall, unspecified quadrant without penetration into peritoneal cavity, initial encounter: Secondary | ICD-10-CM | POA: Diagnosis present

## 2021-06-20 DIAGNOSIS — Z170 Estrogen receptor positive status [ER+]: Secondary | ICD-10-CM | POA: Diagnosis not present

## 2021-06-20 DIAGNOSIS — N2889 Other specified disorders of kidney and ureter: Secondary | ICD-10-CM | POA: Diagnosis present

## 2021-06-20 DIAGNOSIS — E782 Mixed hyperlipidemia: Secondary | ICD-10-CM | POA: Diagnosis present

## 2021-06-20 DIAGNOSIS — C50111 Malignant neoplasm of central portion of right female breast: Secondary | ICD-10-CM | POA: Diagnosis present

## 2021-06-20 DIAGNOSIS — I82612 Acute embolism and thrombosis of superficial veins of left upper extremity: Secondary | ICD-10-CM | POA: Diagnosis present

## 2021-06-20 DIAGNOSIS — Z9011 Acquired absence of right breast and nipple: Secondary | ICD-10-CM | POA: Diagnosis not present

## 2021-06-20 DIAGNOSIS — N289 Disorder of kidney and ureter, unspecified: Secondary | ICD-10-CM | POA: Diagnosis not present

## 2021-06-20 DIAGNOSIS — K760 Fatty (change of) liver, not elsewhere classified: Secondary | ICD-10-CM | POA: Diagnosis present

## 2021-06-20 DIAGNOSIS — Z8249 Family history of ischemic heart disease and other diseases of the circulatory system: Secondary | ICD-10-CM | POA: Diagnosis not present

## 2021-06-20 DIAGNOSIS — F410 Panic disorder [episodic paroxysmal anxiety] without agoraphobia: Secondary | ICD-10-CM | POA: Diagnosis present

## 2021-06-20 DIAGNOSIS — M7989 Other specified soft tissue disorders: Secondary | ICD-10-CM | POA: Diagnosis not present

## 2021-06-20 LAB — CBC WITH DIFFERENTIAL/PLATELET
Abs Immature Granulocytes: 0.04 10*3/uL (ref 0.00–0.07)
Basophils Absolute: 0.1 10*3/uL (ref 0.0–0.1)
Basophils Relative: 1 %
Eosinophils Absolute: 0.2 10*3/uL (ref 0.0–0.5)
Eosinophils Relative: 2 %
HCT: 39 % (ref 36.0–46.0)
Hemoglobin: 13.2 g/dL (ref 12.0–15.0)
Immature Granulocytes: 1 %
Lymphocytes Relative: 44 %
Lymphs Abs: 2.7 10*3/uL (ref 0.7–4.0)
MCH: 29.8 pg (ref 26.0–34.0)
MCHC: 33.8 g/dL (ref 30.0–36.0)
MCV: 88 fL (ref 80.0–100.0)
Monocytes Absolute: 0.4 10*3/uL (ref 0.1–1.0)
Monocytes Relative: 7 %
Neutro Abs: 2.9 10*3/uL (ref 1.7–7.7)
Neutrophils Relative %: 45 %
Platelets: 179 10*3/uL (ref 150–400)
RBC: 4.43 MIL/uL (ref 3.87–5.11)
RDW: 13 % (ref 11.5–15.5)
WBC: 6.2 10*3/uL (ref 4.0–10.5)
nRBC: 0 % (ref 0.0–0.2)

## 2021-06-20 LAB — COMPREHENSIVE METABOLIC PANEL
ALT: 16 U/L (ref 0–44)
AST: 19 U/L (ref 15–41)
Albumin: 3.5 g/dL (ref 3.5–5.0)
Alkaline Phosphatase: 44 U/L (ref 38–126)
Anion gap: 5 (ref 5–15)
BUN: 10 mg/dL (ref 6–20)
CO2: 27 mmol/L (ref 22–32)
Calcium: 9.1 mg/dL (ref 8.9–10.3)
Chloride: 111 mmol/L (ref 98–111)
Creatinine, Ser: 0.72 mg/dL (ref 0.44–1.00)
GFR, Estimated: >60 mL/min (ref >60)
Glucose, Bld: 95 mg/dL (ref 70–99)
Potassium: 3.5 mmol/L (ref 3.5–5.1)
Sodium: 143 mmol/L (ref 135–145)
Total Bilirubin: 0.5 mg/dL (ref 0.3–1.2)
Total Protein: 5.9 g/dL — ABNORMAL LOW (ref 6.5–8.1)

## 2021-06-20 LAB — SEDIMENTATION RATE: Sed Rate: 3 mm/hr (ref 0–22)

## 2021-06-20 MED ORDER — ENOXAPARIN SODIUM 40 MG/0.4ML IJ SOSY
40.0000 mg | PREFILLED_SYRINGE | INTRAMUSCULAR | Status: DC
  Administered 2021-06-20 – 2021-06-21 (×2): 40 mg via SUBCUTANEOUS
  Filled 2021-06-20 (×2): qty 0.4

## 2021-06-20 MED ORDER — LINEZOLID 600 MG PO TABS
600.0000 mg | ORAL_TABLET | Freq: Two times a day (BID) | ORAL | Status: DC
  Administered 2021-06-20 – 2021-06-22 (×4): 600 mg via ORAL
  Filled 2021-06-20 (×5): qty 1

## 2021-06-20 MED ORDER — GADOBUTROL 1 MMOL/ML IV SOLN
8.0000 mL | Freq: Once | INTRAVENOUS | Status: AC | PRN
  Administered 2021-06-20: 8 mL via INTRAVENOUS

## 2021-06-20 NOTE — Progress Notes (Signed)
VAST consult received to obtain IV access. This patient currently does not have any IV meds or fluids ordered. Notified Kennyth Lose, RN and Dr. Darrick Meigs that best practice is to not place "just in case" IV's. We want to decrease risk of infection from extra sticks as well as preserve veins for when they are actually needed. If this patient's circumstances change and she needs an IV, VAS/IV team will be glad to come obtain access at that time. We also respond to all codes on this campus. Dr. Darrick Meigs responded he was okay with leaving access out at this time. Kennyth Lose, RN verbalized understanding. ?

## 2021-06-20 NOTE — Progress Notes (Signed)
[facility] ? ?PROGRESS NOTE ? ?Melissa Shepherd:[id number] DOB: [birth year] DOA: 06/18/2021 ?PCP: Remi Haggard, FNP ? ? 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 ?Patient stated that she had an elevated area of redness and swelling in her lower abdominal wall in first week of April.  She went to [facility] for evaluation at that time and was given a dose of intravenous vancomycin and was discharged home on course of Bactrim. ?Patient states that despite taking Bactrim she experienced worsening and enlargement of abdominal wound that did not resolve with Bactrim.  As per patient wound has been enlarging and has been actively draining yellow drainage.  This was associated with moderate to severe abdominal pain. ?In the ED patient was found to be suffering from ongoing ulceration abdominal wall cellulitis and was started on IV vancomycin. ? ? ? ?Subjective  ? ?Patient wound looks much better after getting IV vancomycin.  Pain is adequately controlled. ? ? Assessment/Plan:  ? ? ?Abdominal wall ulcer/cellulitis ?-Patient had failed outpatient therapy with Bactrim ?-CT abdomen/pelvis showed no evidence of underlying abscess ?-Continue as needed pain with opioids ?-Blood cultures ordered ?-Wound culture grew Staph aureus ?-CRP 0.5 ?-ID was consulted, IV vancomycin has been changed to p.o. Zyvox ? ?Lesion of left native kidney ?-CT abdomen/pelvis shows 2.9 cm complex cystic lesion with enhancing septations in the left lower kidney raising concern for cystic renal neoplasm ?-MRI abdomen with and without contrast recommended ?-We will also obtain urology consultation, as patient lacks insurance and will not be able to follow-up as  outpatient ? ?Hypokalemia ?-Secondary to poor p.o. intake in setting of taking HCTZ ?-Replete ? ?Hypertension ?-Blood pressure is well controlled, continue HCTZ ? ?Hyperlipidemia ?-Continue rosuvastatin ? ?Malignant neoplasm of central portion of right breast in female, estrogen receptor positive ?-Patient receiving outpatient monthly Lupron injections ?-Additionally receiving daily Aromasin tablets ? ? ? ?Medications ? ?  ? colestipol  2 g Oral Q12H  ? exemestane  25 mg Oral Daily  ? hydrochlorothiazide  12.5 mg Oral Daily  ? linezolid  600 mg Oral Q12H  ? loratadine  10 mg Oral Daily  ? nicotine  14 mg Transdermal Daily  ? rosuvastatin  40 mg Oral QHS  ? ? ? Data Reviewed:  ? ?CBG: ? ?No results for input(s): GLUCAP in the last 168 hours. ? ?SpO2: 99 %  ? ? ?Vitals:  ? 06/19/21 1203 06/19/21 2208 06/20/21 0530 06/20/21 1331  ?BP: (!) 151/94 (!) 151/83 127/84 134/83  ?Pulse: (!) 54 (!) 55 (!) 52 (!) 58  ?Resp:  (!) '22 18 16  '$ ?Temp: 97.9 ?F (36.6 ?C) (!) 97.4 ?F (36.3 ?C) (!) 97.4 ?F (36.3 ?C) 97.7 ?F (36.5 ?C)  ?TempSrc: Oral Oral Oral Oral  ?SpO2: 99% 100% 97% 99%  ?Weight:      ?Height:      ? ? ? ? ?Data Reviewed: ? ?Basic Metabolic Panel: ?Recent Labs  ?Lab 06/18/21 ?2303 06/19/21 ?4010 06/20/21 ?2725  ?NA 140 142 143  ?K 3.2* 3.6 3.5  ?CL 107 110 111  ?CO2 '28 26 27  '$ ?GLUCOSE 85 88 95  ?BUN '9 9 10  '$ ?CREATININE 0.74 0.58 0.72  ?CALCIUM 9.3 8.9 9.1  ?MG  --  1.8  --   ? ? ?  CBC: ?Recent Labs  ?Lab 06/18/21 ?2303 06/19/21 ?1610 06/20/21 ?9604  ?WBC 8.0 5.9 6.2  ?NEUTROABS 4.1 3.0 2.9  ?HGB 13.0 13.2 13.2  ?HCT 38.5 39.9 39.0  ?MCV 90.4 90.3 88.0  ?PLT 204 200 179  ? ? ?LFT ?Recent Labs  ?Lab 06/19/21 ?0952 06/20/21 ?5409  ?AST 19 19  ?ALT 19 16  ?ALKPHOS 41 44  ?BILITOT 0.6 0.5  ?PROT 6.0* 5.9*  ?ALBUMIN 3.8 3.5  ? ?  ?Antibiotics: ?Anti-infectives (From admission, onward)  ? ? Start     Dose/Rate Route Frequency Ordered Stop  ? 06/20/21 1515  linezolid (ZYVOX) tablet 600 mg       ? 600 mg Oral Every 12 hours  06/20/21 1418    ? 06/19/21 1200  vancomycin (VANCOCIN) IVPB 1000 mg/200 mL premix  Status:  Discontinued       ? 1,000 mg ?200 mL/hr over 60 Minutes Intravenous Every 12 hours 06/19/21 0838 06/20/21 1418  ? 06/18/21 2315  vancomycin (VANCOREADY) IVPB 1750 mg/350 mL       ? 1,750 mg ?175 mL/hr over 120 Minutes Intravenous  Once 06/18/21 2305 06/19/21 0304  ? ?  ? ? ? ?DVT prophylaxis: Lovenox ? ?Code Status: Full code ? ?Family Communication: No family at bedside ? ? ?CONSULTS infectious disease ? ? ?Objective  ? ? ?Physical Examination: ? ? ?General-appears in no acute distress ?Heart-S1-S2, regular, no murmur auscultated ?Lungs-clear to auscultation bilaterally, no wheezing or crackles auscultated ?Abdomen-soft, nontender, no organomegaly ?Extremities-no edema in the lower extremities ?Neuro-alert, oriented x3, no focal deficit noted ?Skin- ?Large ulcer noted on the left lower abdominal wall, mild erythema surrounding ulcer ? ?Status is: Inpatient: Abdominal wall ulcer ? ? ? ?  ? ? ? ? ? ?Oswald Hillock ?  ?[facility] ?If 7PM-7AM, please contact night-coverage at [email or web address], ?Office  [phone] ? ? ?06/20/2021, 5:24 PM  LOS: 0 days  ? ? ? ? ? ? ? ? ? ? ?  ?

## 2021-06-20 NOTE — TOC Initial Note (Signed)
Transition of Care (TOC) - Initial/Assessment Note  ? ? ?Patient Details  ?Name: Melissa Shepherd ?MRN: [id number] ?Date of Birth: [birth year] ? ?Transition of Care (TOC) CM/SW Contact:    ?Tawanna Cooler, RN ?Phone Number: ?06/20/2021, 12:11 PM ? ?Clinical Narrative:                 ? ?Transition of Care Department [facility]) has reviewed patient and no TOC needs have been identified at this time. We will continue to monitor patient advancement through interdisciplinary progression rounds. If new patient transition needs arise, please place a TOC consult. ? ? ? ?Expected Discharge Plan: Home/Self Care ?Barriers to Discharge: Continued Medical Work up ? ? ?Expected Discharge Plan and Services ?Expected Discharge Plan: Home/Self Care ?  ?   ?Living arrangements for the past 2 months: [place] ?                ?  ?Prior Living Arrangements/Services ?Living arrangements for the past 2 months: [place] ?Lives with:: Self ?Patient language and need for interpreter reviewed:: Yes ?       ?Need for Family Participation in Patient Care: Yes (Comment) ?Care giver support system in place?: Yes (comment) ?  ?Criminal Activity/Legal Involvement Pertinent to Current Situation/Hospitalization: No - Comment as needed ? ?Activities of Daily Living ?Home Assistive Devices/Equipment: None ?ADL Screening (condition at time of admission) ?Patient's cognitive ability adequate to safely complete daily activities?: Yes ?Is the patient deaf or have difficulty hearing?: No ?Does the patient have difficulty seeing, even when wearing glasses/contacts?: No ?Does the patient have difficulty concentrating, remembering, or making decisions?: No ?Patient able to express need for assistance with ADLs?: Yes ?Does the patient have difficulty dressing or bathing?: No ?Independently performs ADLs?: Yes (appropriate for developmental age) ?Does the patient have difficulty walking or climbing stairs?: No ?Weakness of Legs: None ?Weakness of  Arms/Hands: None ? ?Emotional Assessment ? Orientation: : Oriented to Self, Oriented to Place, Oriented to  Time, Oriented to Situation ?Alcohol / Substance Use: Not Applicable ?Psych Involvement: No (comment) ? ?Admission diagnosis:  Wound check, abscess [Z51.89] ?Abdominal wall cellulitis [O17.510] ?Patient Active Problem List  ? Diagnosis Date Noted  ? Open abdominal wall wound 06/19/2021  ? Hypokalemia 06/19/2021  ? Lesion of left native kidney 06/19/2021  ? Abdominal wall cellulitis 06/18/2021  ? Pain and swelling of lower leg 04/25/2021  ? Cellulitis and abscess of left lower extremity 03/31/2021  ? Malignant neoplasm of central portion of right breast in female, estrogen receptor positive ([place]) 03/06/2021  ? Cholelithiasis with acute on chronic cholecystitis without biliary obstruction 11/26/2016  ? Anxiety 08/18/2015  ? Mixed hyperlipidemia 08/18/2015  ? Vitamin D deficiency 08/18/2015  ? IBS (irritable bowel syndrome) 05/01/2015  ? Lumbago 10/06/2014  ? Bilateral thoracic back pain 10/06/2014  ? Chest pain 07/09/2014  ? Leg swelling 07/09/2014  ? Essential hypertension 06/11/2014  ? Depression 06/11/2014  ? Tobacco use disorder 06/11/2014  ? ?PCP:  Remi Haggard, FNP ?Pharmacy:   ?[facility], [place] [address] ?[address] ?[place] [address] ?Phone: [phone] Fax: [phone] ? ?[id number]?Phone: [phone]: [phone]? ?[id number] [place] [place] [id number]: [phone]: [phone] ? ? ?

## 2021-06-20 NOTE — Consult Note (Signed)
?  [facility] for Infectious Disease  Total days of antibiotics 3 vanco ?        ?      ?Reason for Consult:recurrent MRSA SSTI/abdominal wall infection    ?Referring Physician: Darrick Meigs ? ?Principal Problem: ?  Abdominal wall cellulitis ?Active Problems: ?  Essential hypertension ?  Mixed hyperlipidemia ?  Malignant neoplasm of central portion of right breast in female, estrogen receptor positive ([place]) ?  Open abdominal wall wound ?  Hypokalemia ?  Lesion of left native kidney ? ? ? ?HPI: Melissa Shepherd is a 51 y.o. female with hx of breast ca(DCIS) s/p right mastectomy on lupron injections, htn, recurrent MRSA skin infection, depression/anxiety. She was admitted for worsening pain, drainage and erythema to left sided abdominal wall wound that was not responding to oral abtx(doxycycline). She was hospitalized in early February for the same lesions and previously to that had been had leg lesions in the Fall 2022. Her previous cultures showed MRSA in feb showed R to tetracycline/sulfa/FQ. She also states that in the past month, she noticed lesion under her left axilla that she popped but quickly thereafter has noticed some linear induration to her axilla. She remains afebrile but in the last 24hrs her left AC PIV infiltrated and now has pain and erythema at that site concerning for phlebitis. ? ?Past Medical History:  ?Diagnosis Date  ? Anginal pain ([place])   ? Anxiety   ? panic attacks  ? Cancer [facility])   ? Chicken pox   ? Cholelithiasis with acute on chronic cholecystitis without biliary obstruction 11/26/2016  ? Depression   ? Diarrhea   ? Dyspnea   ? with chest pain  ? Fatty liver   ? Frequent headaches   ? Hypertension   ? IBS (irritable bowel syndrome)   ? Multiple thyroid nodules   ? Seizures ([place])   ? pregenancy- toxemia- 1991  ? ? ?Allergies:  ?Allergies  ?Allergen Reactions  ? Ezetimibe Other (See Comments)  ?  Unknown reaction ?  ? Prednisone Anxiety  ?  Psychosis (intolerance)  ? Morphine   ?  ineffective   ? ? ?MEDICATIONS: ? colestipol  2 g Oral Q12H  ? exemestane  25 mg Oral Daily  ? hydrochlorothiazide  12.5 mg Oral Daily  ? linezolid  600 mg Oral Q12H  ? loratadine  10 mg Oral Daily  ? nicotine  14 mg Transdermal Daily  ? rosuvastatin  40 mg Oral QHS  ? ? ?Social History  ? ?Tobacco Use  ? Smoking status: Every Day  ?  Packs/day: 0.25  ?  Years: 31.00  ?  Pack years: 7.75  ?  Types: Cigarettes  ? Smokeless tobacco: Never  ?Vaping Use  ? Vaping Use: Never used  ?Substance Use Topics  ? Alcohol use: Not Currently  ?  Alcohol/week: 2.0 standard drinks  ?  Types: 2 Cans of beer per week  ? Drug use: No  ? ? ?Family History  ?Problem Relation Age of Onset  ? Hypertension Mother   ? Cancer Sister   ?     cervical  ? Breast cancer Paternal Aunt   ? Breast cancer Maternal Grandmother   ? Cancer Maternal Grandmother   ?     Breast  ? ? ?Review of Systems -  ? ?Constitutional: Negative for fever, chills, diaphoresis, activity change, appetite change, fatigue and unexpected weight change.  ?HENT: Negative for congestion, sore throat, rhinorrhea, sneezing, trouble swallowing and sinus pressure.  ?  Eyes: Negative for photophobia and visual disturbance.  ?Respiratory: Negative for cough, chest tightness, shortness of breath, wheezing and stridor.  ?Cardiovascular: Negative for chest pain, palpitations and leg swelling.  ?Gastrointestinal: Negative for nausea, vomiting, abdominal pain, diarrhea, constipation, blood in stool, abdominal distention and anal bleeding.  ?Genitourinary: Negative for dysuria, hematuria, flank pain and difficulty urinating.  ?Musculoskeletal: Negative for myalgias, back pain, joint swelling, arthralgias and gait problem.  ?Skin: + abdominal wound. Negative for color change, pallor, rash  ?Neurological: Negative for dizziness, tremors, weakness and light-headedness.  ?Hematological: Negative for adenopathy. Does not bruise/bleed easily.  ?Psychiatric/Behavioral: Negative for behavioral problems,  confusion, sleep disturbance, dysphoric mood, decreased concentration and agitation.  ? ?OBJECTIVE: ?Temp:  [97.4 ?F (36.3 ?C)-97.7 ?F (36.5 ?C)] 97.7 ?F (36.5 ?C) (04/26 1331) ?Pulse Rate:  [52-58] 58 (04/26 1331) ?Resp:  [16-22] 16 (04/26 1331) ?BP: (127-151)/(83-84) 134/83 (04/26 1331) ?SpO2:  [97 %-100 %] 99 % (04/26 1331) ?Physical Exam  ?Constitutional:  oriented to person, place, and time. appears well-developed and well-nourished. No distress.  ?HENT: [place]/AT, PERRLA, no scleral icterus ?Mouth/Throat: Oropharynx is clear and moist. No oropharyngeal exudate.  ?Cardiovascular: Normal rate, regular rhythm and normal heart sounds. Exam reveals no gallop and no friction rub.  ?No murmur heard.  ?Pulmonary/Chest: Effort normal and breath sounds normal. No respiratory distress.  has no wheezes.  ?Neck = supple, no nuchal rigidity ?Abdominal: Soft. Bowel sounds are normal.  exhibits no distension. There is no tenderness.  ?Lymphadenopathy: no cervical adenopathy. No axillary adenopathy ?Neurological: alert and oriented to person, place, and time.  ?Skin: Skin is warm and dry. No rash noted. No erythema.  ?Psychiatric: a normal mood and affect.  behavior is normal.  ? ? ?LABS: ?Results for orders placed or performed during the hospital encounter of 06/18/21 (from the past 48 hour(s))  ?CBC with Differential     Status: None  ? Collection Time: 06/18/21 11:03 PM  ?Result Value Ref Range  ? WBC 8.0 4.0 - 10.5 K/uL  ? RBC 4.26 3.87 - 5.11 MIL/uL  ? Hemoglobin 13.0 12.0 - 15.0 g/dL  ? HCT 38.5 36.0 - 46.0 %  ? MCV 90.4 80.0 - 100.0 fL  ? MCH 30.5 26.0 - 34.0 pg  ? MCHC 33.8 30.0 - 36.0 g/dL  ? RDW 13.2 11.5 - 15.5 %  ? Platelets 204 150 - 400 K/uL  ? nRBC 0.0 0.0 - 0.2 %  ? Neutrophils Relative % 51 %  ? Neutro Abs 4.1 1.7 - 7.7 K/uL  ? Lymphocytes Relative 41 %  ? Lymphs Abs 3.3 0.7 - 4.0 K/uL  ? Monocytes Relative 5 %  ? Monocytes Absolute 0.4 0.1 - 1.0 K/uL  ? Eosinophils Relative 2 %  ? Eosinophils Absolute 0.2 0.0 -  0.5 K/uL  ? Basophils Relative 1 %  ? Basophils Absolute 0.1 0.0 - 0.1 K/uL  ? Immature Granulocytes 0 %  ? Abs Immature Granulocytes 0.02 0.00 - 0.07 K/uL  ?  Comment: Performed at [facility], [place] [address], [place], [place] [address]  ?Basic metabolic panel     Status: Abnormal  ? Collection Time: 06/18/21 11:03 PM  ?Result Value Ref Range  ? Sodium 140 135 - 145 mmol/L  ? Potassium 3.2 (L) 3.5 - 5.1 mmol/L  ? Chloride 107 98 - 111 mmol/L  ? CO2 28 22 - 32 mmol/L  ? Glucose, Bld 85 70 - 99 mg/dL  ?  Comment: Glucose reference range applies only to  samples taken after fasting for at least 8 hours.  ? BUN 9 6 - 20 mg/dL  ? Creatinine, Ser 0.74 0.44 - 1.00 mg/dL  ? Calcium 9.3 8.9 - 10.3 mg/dL  ? GFR, Estimated >60 >60 mL/min  ?  Comment: (NOTE) ?Calculated using the CKD-EPI Creatinine Equation (2021) ?  ? Anion gap 5 5 - 15  ?  Comment: Performed at [facility], [place] [address], [place], [place] [address]  ?Comprehensive metabolic panel     Status: Abnormal  ? Collection Time: 06/19/21  9:52 AM  ?Result Value Ref Range  ? Sodium 142 135 - 145 mmol/L  ? Potassium 3.6 3.5 - 5.1 mmol/L  ? Chloride 110 98 - 111 mmol/L  ? CO2 26 22 - 32 mmol/L  ? Glucose, Bld 88 70 - 99 mg/dL  ?  Comment: Glucose reference range applies only to samples taken after fasting for at least 8 hours.  ? BUN 9 6 - 20 mg/dL  ? Creatinine, Ser 0.58 0.44 - 1.00 mg/dL  ? Calcium 8.9 8.9 - 10.3 mg/dL  ? Total Protein 6.0 (L) 6.5 - 8.1 g/dL  ? Albumin 3.8 3.5 - 5.0 g/dL  ? AST 19 15 - 41 U/L  ? ALT 19 0 - 44 U/L  ? Alkaline Phosphatase 41 38 - 126 U/L  ? Total Bilirubin 0.6 0.3 - 1.2 mg/dL  ? GFR, Estimated >60 >60 mL/min  ?  Comment: (NOTE) ?Calculated using the CKD-EPI Creatinine Equation (2021) ?  ? Anion gap 6 5 - 15  ?  Comment: Performed at [facility], [place] [address] [place], [place] [address]  ?CBC with Differential/Platelet     Status: None  ? Collection Time: 06/19/21  9:52 AM   ?Result Value Ref Range  ? WBC 5.9 4.0 - 10.5 K/uL  ? RBC 4.42 3.87 - 5.11 MIL/uL  ? Hemoglobin 13.2 12.0 - 15.0 g/dL  ? HCT 39.9 36.0 - 46.0 %  ? MCV 90.3 80.0 - 100.0 fL  ? MCH 29.9 26.0 - 34.0 pg  ? MCHC 33.1 3

## 2021-06-20 NOTE — Plan of Care (Signed)

## 2021-06-20 NOTE — Consult Note (Signed)
Urology Consult  ? ?Physician requesting consult: Dr. Darrick Meigs ? ?Reason for consult: Left renal lesion ? ?History of Present Illness: Melissa Shepherd is a 51 y.o. female with history of breast cancer (DCIS).  She is on Lupron currently.  She is admitted for management of persistent abdominal wall cellulitis.  On initial admission, she had CT abdomen and pelvis.  This revealed a 29 mm Bosniak category 3 cyst in the left central kidney.. ? ?The patient had CT scan performed in 2019.  It appears that she has a much smaller cystic lesion, perhaps 13 mm in size, in the same position on that scan.  There is no associated adenopathy on the current scan. ? ? ?Past Medical History:  ?Diagnosis Date  ? Anginal pain ([place])   ? Anxiety   ? panic attacks  ? Cancer [facility] [place])   ? Chicken pox   ? Cholelithiasis with acute on chronic cholecystitis without biliary obstruction 11/26/2016  ? Depression   ? Diarrhea   ? Dyspnea   ? with chest pain  ? Fatty liver   ? Frequent headaches   ? Hypertension   ? IBS (irritable bowel syndrome)   ? Multiple thyroid nodules   ? Seizures ([place])   ? pregenancy- toxemia- 1991  ? ? ?Past Surgical History:  ?Procedure Laterality Date  ? CESAREAN SECTION  1991. 2003.  ? 2  ? CHOLECYSTECTOMY N/A 11/26/2016  ? Procedure: LAPAROSCOPIC CHOLECYSTECTOMY WITH INTRAOPERATIVE CHOLANGIOGRAM;  Surgeon: Fanny Skates, MD;  Location: [place];  Service: General;  Laterality: N/A;  ? MASTECTOMY Right   ? WISDOM TOOTH EXTRACTION    ? ? ? ?Current Hospital Medications: ?Scheduled Meds: ? colestipol  2 g Oral Q12H  ? enoxaparin (LOVENOX) injection  40 mg Subcutaneous Q24H  ? exemestane  25 mg Oral Daily  ? hydrochlorothiazide  12.5 mg Oral Daily  ? linezolid  600 mg Oral Q12H  ? loratadine  10 mg Oral Daily  ? nicotine  14 mg Transdermal Daily  ? rosuvastatin  40 mg Oral QHS  ? ?Continuous Infusions: ?PRN Meds:.acetaminophen **OR** acetaminophen, ALPRAZolam, hydrALAZINE, oxyCODONE-acetaminophen **OR** morphine injection,  ondansetron **OR** ondansetron (ZOFRAN) IV, polyethylene glycol ? ?Allergies:  ?Allergies  ?Allergen Reactions  ? Ezetimibe Other (See Comments)  ?  Unknown reaction ?  ? Prednisone Anxiety  ?  Psychosis (intolerance)  ? Morphine   ?  ineffective  ? ? ?Family History  ?Problem Relation Age of Onset  ? Hypertension Mother   ? Cancer Sister   ?     cervical  ? Breast cancer Paternal Aunt   ? Breast cancer Maternal Grandmother   ? Cancer Maternal Grandmother   ?     Breast  ? ? ?Social History:  reports that she has been smoking cigarettes. She has a 7.75 pack-year smoking history. She has never used smokeless tobacco. She reports that she does not currently use alcohol after a past usage of about 2.0 standard drinks per week. She reports that she does not use drugs. ? ?ROS: ?A complete review of systems was performed.  All systems are negative except for pertinent findings as noted. ? ?Physical Exam:  ?Vital signs in last 24 hours: ?Temp:  [97.4 ?F (36.3 ?C)-97.7 ?F (36.5 ?C)] 97.7 ?F (36.5 ?C) (04/26 1331) ?Pulse Rate:  [52-58] 58 (04/26 1331) ?Resp:  [16-22] 16 (04/26 1331) ?BP: (127-151)/(83-84) 134/83 (04/26 1331) ?SpO2:  [97 %-100 %] 99 % (04/26 1331) ?General:  Alert and oriented, No acute distress ?HEENT: Normocephalic,  atraumatic ?Neck: No JVD or lymphadenopathy ?Cardiovascular: Regular rate  ?Lungs: Normal inspiratory/expiratory excursion ?Extremities: No edema ?Neurologic: Grossly intact ? ?Laboratory Data:  ?Recent Labs  ?  06/18/21 ?2303 06/19/21 ?0952 06/20/21 ?2248  ?WBC 8.0 5.9 6.2  ?HGB 13.0 13.2 13.2  ?HCT 38.5 39.9 39.0  ?PLT 204 200 179  ? ? ?Recent Labs  ?  06/18/21 ?2303 06/19/21 ?0952 06/20/21 ?2500  ?NA 140 142 143  ?K 3.2* 3.6 3.5  ?CL 107 110 111  ?GLUCOSE 85 88 95  ?BUN '9 9 10  '$ ?CALCIUM 9.3 8.9 9.1  ?CREATININE 0.74 0.58 0.72  ? ? ? ?Results for orders placed or performed during the hospital encounter of 06/18/21 (from the past 24 hour(s))  ?CBC with Differential/Platelet     Status: None  ?  Collection Time: 06/20/21  5:28 AM  ?Result Value Ref Range  ? WBC 6.2 4.0 - 10.5 K/uL  ? RBC 4.43 3.87 - 5.11 MIL/uL  ? Hemoglobin 13.2 12.0 - 15.0 g/dL  ? HCT 39.0 36.0 - 46.0 %  ? MCV 88.0 80.0 - 100.0 fL  ? MCH 29.8 26.0 - 34.0 pg  ? MCHC 33.8 30.0 - 36.0 g/dL  ? RDW 13.0 11.5 - 15.5 %  ? Platelets 179 150 - 400 K/uL  ? nRBC 0.0 0.0 - 0.2 %  ? Neutrophils Relative % 45 %  ? Neutro Abs 2.9 1.7 - 7.7 K/uL  ? Lymphocytes Relative 44 %  ? Lymphs Abs 2.7 0.7 - 4.0 K/uL  ? Monocytes Relative 7 %  ? Monocytes Absolute 0.4 0.1 - 1.0 K/uL  ? Eosinophils Relative 2 %  ? Eosinophils Absolute 0.2 0.0 - 0.5 K/uL  ? Basophils Relative 1 %  ? Basophils Absolute 0.1 0.0 - 0.1 K/uL  ? Immature Granulocytes 1 %  ? Abs Immature Granulocytes 0.04 0.00 - 0.07 K/uL  ?Comprehensive metabolic panel     Status: Abnormal  ? Collection Time: 06/20/21  5:28 AM  ?Result Value Ref Range  ? Sodium 143 135 - 145 mmol/L  ? Potassium 3.5 3.5 - 5.1 mmol/L  ? Chloride 111 98 - 111 mmol/L  ? CO2 27 22 - 32 mmol/L  ? Glucose, Bld 95 70 - 99 mg/dL  ? BUN 10 6 - 20 mg/dL  ? Creatinine, Ser 0.72 0.44 - 1.00 mg/dL  ? Calcium 9.1 8.9 - 10.3 mg/dL  ? Total Protein 5.9 (L) 6.5 - 8.1 g/dL  ? Albumin 3.5 3.5 - 5.0 g/dL  ? AST 19 15 - 41 U/L  ? ALT 16 0 - 44 U/L  ? Alkaline Phosphatase 44 38 - 126 U/L  ? Total Bilirubin 0.5 0.3 - 1.2 mg/dL  ? GFR, Estimated >60 >60 mL/min  ? Anion gap 5 5 - 15  ?Sedimentation rate     Status: None  ? Collection Time: 06/20/21  5:58 AM  ?Result Value Ref Range  ? Sed Rate 3 0 - 22 mm/hr  ? ?Recent Results (from the past 240 hour(s))  ?Culture, blood (Routine X 2) w Reflex to ID Panel     Status: None (Preliminary result)  ? Collection Time: 06/19/21  9:52 AM  ? Specimen: BLOOD LEFT HAND  ?Result Value Ref Range Status  ? Specimen Description   Final  ?  BLOOD LEFT HAND ?Performed at [facility], [place] [address], [place], [place] [address] ?  ? Special Requests   Final  ?  BOTTLES DRAWN AEROBIC AND ANAEROBIC  Blood  Culture adequate volume ?Performed at [facility], [place] [address] [place], [place] [address] ?  ? Culture   Final  ?  NO GROWTH < 24 HOURS ?Performed at [facility], [place] [address], [place], [place] [address] ?  ? Report Status PENDING  Incomplete  ?Culture, blood (Routine X 2) w Reflex to ID Panel     Status: None (Preliminary result)  ? Collection Time: 06/19/21  9:52 AM  ? Specimen: BLOOD  ?Result Value Ref Range Status  ? Specimen Description   Final  ?  BLOOD BLOOD LEFT FOREARM ?Performed at [facility], [place] [address], [place], [place] [address] ?  ? Special Requests   Final  ?  IN PEDIATRIC BOTTLE Blood Culture results may not be optimal due to an inadequate volume of blood received in culture bottles ?Performed at [facility], [place] [address] [place], [place] [address] ?  ? Culture   Final  ?  NO GROWTH < 24 HOURS ?Performed at [facility], [place] [address] [place], [place] [address] ?  ? Report Status PENDING  Incomplete  ?Aerobic Culture w Gram Stain (superficial specimen)     Status: None (Preliminary result)  ? Collection Time: 06/19/21  2:00 PM  ? Specimen: Wound  ?Result Value Ref Range Status  ? Specimen Description   Final  ?  WOUND ?Performed at [facility], [place] [address] [place], [place] [address] ?  ? Special Requests   Final  ?  ABDOMEN ?Performed at [facility], [place] [address] [place], [place] [address] ?  ? Gram Stain   Final  ?  ABUNDANT WBC PRESENT, PREDOMINANTLY PMN ?RARE GRAM POSITIVE COCCI ?  ? Culture   Final  ?  FEW STAPHYLOCOCCUS AUREUS ?SUSCEPTIBILITIES TO FOLLOW ?Performed at [facility], [place] [address], [place],  [address] ?  ? Report Status PENDING  Incomplete  ? ? ?Renal Function: ?Recent Labs  ?  06/18/21 ?2303 06/19/21 ?0952 06/20/21 ?1751  ?CREATININE 0.74 0.58 0.72  ? ?Estimated Creatinine Clearance: 92.8 mL/min (by C-G formula based on SCr of  0.72 mg/dL). ? ?Radiologic Imaging: ?CT ABDOMEN PELVIS W CONTRAST ? ?Result Date: 06/19/2021 ?CLINICAL DATA:  Abdominal wound infection EXAM: CT ABDOMEN AND PELVIS WITH CONTRAST TECHNIQUE: Multidetector CT ima

## 2021-06-21 ENCOUNTER — Other Ambulatory Visit (HOSPITAL_COMMUNITY): Payer: Self-pay

## 2021-06-21 ENCOUNTER — Inpatient Hospital Stay (HOSPITAL_COMMUNITY): Payer: 59

## 2021-06-21 ENCOUNTER — Telehealth (INDEPENDENT_AMBULATORY_CARE_PROVIDER_SITE_OTHER): Payer: Self-pay | Admitting: Vascular Surgery

## 2021-06-21 ENCOUNTER — Encounter: Payer: Self-pay | Admitting: Hematology

## 2021-06-21 DIAGNOSIS — N289 Disorder of kidney and ureter, unspecified: Secondary | ICD-10-CM | POA: Diagnosis not present

## 2021-06-21 DIAGNOSIS — C50111 Malignant neoplasm of central portion of right female breast: Secondary | ICD-10-CM | POA: Diagnosis not present

## 2021-06-21 DIAGNOSIS — I10 Essential (primary) hypertension: Secondary | ICD-10-CM | POA: Diagnosis not present

## 2021-06-21 DIAGNOSIS — M7989 Other specified soft tissue disorders: Secondary | ICD-10-CM

## 2021-06-21 DIAGNOSIS — L03311 Cellulitis of abdominal wall: Secondary | ICD-10-CM | POA: Diagnosis not present

## 2021-06-21 LAB — PROTIME-INR
INR: 0.9 (ref 0.8–1.2)
Prothrombin Time: 12.3 seconds (ref 11.4–15.2)

## 2021-06-21 LAB — AEROBIC CULTURE W GRAM STAIN (SUPERFICIAL SPECIMEN)

## 2021-06-21 MED ORDER — ASPIRIN EC 81 MG PO TBEC
81.0000 mg | DELAYED_RELEASE_TABLET | Freq: Every day | ORAL | Status: DC
  Administered 2021-06-21 – 2021-06-22 (×2): 81 mg via ORAL
  Filled 2021-06-21 (×2): qty 1

## 2021-06-21 MED ORDER — MIDAZOLAM HCL 2 MG/2ML IJ SOLN
INTRAMUSCULAR | Status: AC
  Filled 2021-06-21: qty 4

## 2021-06-21 MED ORDER — POTASSIUM CHLORIDE CRYS ER 20 MEQ PO TBCR
20.0000 meq | EXTENDED_RELEASE_TABLET | Freq: Every day | ORAL | Status: DC
Start: 2021-06-21 — End: 2021-06-22
  Administered 2021-06-21 – 2021-06-22 (×2): 20 meq via ORAL
  Filled 2021-06-21 (×2): qty 1

## 2021-06-21 MED ORDER — MIDAZOLAM HCL 2 MG/2ML IJ SOLN
INTRAMUSCULAR | Status: AC | PRN
  Administered 2021-06-21: 1 mg via INTRAVENOUS
  Administered 2021-06-21: 2 mg via INTRAVENOUS
  Administered 2021-06-21: 1 mg via INTRAVENOUS

## 2021-06-21 MED ORDER — FENTANYL CITRATE (PF) 100 MCG/2ML IJ SOLN
INTRAMUSCULAR | Status: AC | PRN
  Administered 2021-06-21 (×2): 50 ug via INTRAVENOUS

## 2021-06-21 MED ORDER — FENTANYL CITRATE (PF) 100 MCG/2ML IJ SOLN
INTRAMUSCULAR | Status: AC
  Filled 2021-06-21: qty 2

## 2021-06-21 NOTE — Procedures (Signed)
Vascular and Interventional Radiology Procedure Note ? ?Patient: Melissa Shepherd ?DOB: 1970/05/09 ?Medical Record Number: [id number] ?Note Date/Time: 06/21/21 5:13 PM  ? ?Performing Physician: Michaelle Birks, MD ?Assistant(s): None ? ?Diagnosis: Complex L renal cystic mass ? ?Procedure: LEFT RENAL MASS BIOPSY ? ?Anesthesia: Conscious Sedation ?Complications: None ?Estimated Blood Loss: Minimal ?Specimens: Sent for Pathology ? ?Findings:  ?Successful CT-guided biopsy of L renal mass. ?A total of 4 samples were obtained. ?Hemostasis of the tract was achieved using Gelfoam Slurry Embolization. ? ?Plan: Bed rest for 2 hours. ? ?See detailed procedure note with images in PACS. ?The patient tolerated the procedure well without incident or complication and was returned to Floor Bed in stable condition.  ? ? ?Michaelle Birks, MD ?[facility] ?[facility] ? ? ?Pager. [phone] ?Clinic. [phone]  ?

## 2021-06-21 NOTE — Consult Note (Signed)
? ?Chief Complaint: ?Patient was seen in consultation today for image guided cystic left renal lesion biopsy ?Chief Complaint  ?Patient presents with  ? wound  ? ? ?Referring Physician(s): ?Dahlstedt,S ? ?Supervising Physician: Corrie Mckusick ? ?Patient Status: [facility] ? ?History of Present Illness: ?Melissa Shepherd is a 51 y.o. female with past medical history significant for DCIS right breast with prior mastectomy (on Lupron),  angina, anxiety/panic attacks/ depression, gallstones,  hyperlipidemia, fatty liver, headaches, hypertension, IBS as well as frequent MRSA skin infections. She recently presented to [facility] with lower abdominal wall wound/pain despite antibiotic treatment. In the ED patient was noted to have ongoing ulceration at the left abdominal wall wound site with associated cellulitis.  She was started on IV vancomycin and currently on Zyvox.  Imaging of the abdomen pelvis revealed cellulitis along the left lower abdominal wall with no drainable fluid collection or abscess.  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  Patient was evaluated by urology.  MRI abdomen was obtained which confirmed a 3 cm complex cystic lesion of left lower kidney with enhancing septations concerning for neoplasm.  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  There was no renal vein invasion or regional lymphadenopathy.  Prior review of 2019 CT revealed a much smaller cystic lesion about 1.3 cm in the same position on that scan.  Request now received for image guided biopsy of the left renal cystic lesion. ? ?Past Medical History:  ?Diagnosis Date  ? Anginal pain ([place])   ? Anxiety   ? panic attacks  ? Cancer [facility])   ? Chicken pox   ? Cholelithiasis with acute on chronic cholecystitis without biliary obstruction 11/26/2016  ? Depression   ? Diarrhea   ? Dyspnea   ? with chest pain  ? Fatty  liver   ? Frequent headaches   ? Hypertension   ? IBS (irritable bowel syndrome)   ? Multiple thyroid nodules   ? Seizures ([place])   ? pregenancy- toxemia- 1991  ? ? ?Past Surgical History:  ?Procedure Laterality Date  ? CESAREAN SECTION  1991. 2003.  ? 2  ? CHOLECYSTECTOMY N/A 11/26/2016  ? Procedure: LAPAROSCOPIC CHOLECYSTECTOMY WITH INTRAOPERATIVE CHOLANGIOGRAM;  Surgeon: Fanny Skates, MD;  Location: [place];  Service: General;  Laterality: N/A;  ? MASTECTOMY Right   ? WISDOM TOOTH EXTRACTION    ? ? ?Allergies: ?Ezetimibe, Prednisone, and Morphine ? ?Medications: ?Prior to Admission medications   ?Medication Sig Start Date End Date Taking? Authorizing Provider  ?ALPRAZolam (XANAX) 1 MG tablet Take 1 mg by mouth 3 (three) times daily.   Yes [provider]  ?amphetamine-dextroamphetamine (ADDERALL) 20 MG tablet Take 20 mg by mouth 2 (two) times daily. 12/23/17  Yes [provider]  ?ascorbic acid (VITAMIN C) 250 MG CHEW Chew 500 mg by mouth at bedtime.   Yes [provider]  ?CALCIUM PO Take 600 mg by mouth at bedtime.   Yes [provider]  ?Cholecalciferol (VITAMIN D-3) 125 MCG (5000 UT) TABS Take 2 tablets by mouth at bedtime.   Yes [provider]  ?colestipol (COLESTID) 1 g tablet Take 1-2 g by mouth 2 (two) times daily. 02/01/21  Yes [provider]  ?exemestane (AROMASIN) 25 MG tablet TAKE 1 TABLET (25 MG TOTAL) BY MOUTH DAILY. ?Patient taking differently: Take 25 mg by mouth daily after breakfast. 04/27/21  Yes Benay Pike, MD  ?folic acid (FOLVITE) 1 MG tablet Take 1 mg by mouth at bedtime. 12/29/20  Yes [provider]  ?furosemide (LASIX) 20 MG tablet Take 20 mg by mouth daily as needed for fluid or edema.   Yes [provider]  ?gabapentin (NEURONTIN) 300 MG capsule Take 300 mg by mouth 3 (three) times daily. 02/13/21  Yes [provider]  ?hydrochlorothiazide (HYDRODIURIL) 12.5 MG tablet TAKE 1 TABLET BY MOUTH  DAILY. ?Patient taking differently: Take 12.5 mg by mouth daily. 11/18/16  Yes Leone Haven, MD  ?Iron-Vitamins (GERITOL PO) Take 1 tablet by mouth daily.   Yes [provider]  ?meloxicam (MOBIC) 15 MG tablet Take 15 mg by mouth as needed (knee pain).   Yes [provider]  ?metoprolol succinate (TOPROL-XL) 100 MG 24 hr tablet Take 100 mg by mouth daily. 03/12/21  Yes [provider]  ?nystatin (MYCOSTATIN) 100000 UNIT/ML suspension Use as directed 5 mLs (500,000 Units total) in the mouth or throat 4 (four) times daily as needed (Oral irritation or Oral thrush). 06/07/20  Yes Scot Jun, FNP  ?potassium chloride SA (KLOR-CON M) 20 MEQ tablet Take 20 mEq by mouth at bedtime. 03/09/21  Yes [provider]  ?rosuvastatin (CRESTOR) 40 MG tablet Take 40 mg by mouth at bedtime.   Yes [provider]  ?sertraline (ZOLOFT) 100 MG tablet Take 100 mg by mouth at bedtime.   Yes [provider]  ?amLODipine (NORVASC) 10 MG tablet Take 10 mg by mouth daily. ?Patient not taking: Reported on 04/23/2021 03/16/21   [provider]  ?  ? ?Family History  ?Problem Relation Age of Onset  ? Hypertension Mother   ? Cancer Sister   ?     cervical  ? Breast cancer Paternal Aunt   ? Breast cancer Maternal Grandmother   ? Cancer Maternal Grandmother   ?     Breast  ? ? ?Social History  ? ?Socioeconomic History  ? Marital status: Married  ?  Spouse name: Not on file  ? Number of children: Not on file  ? Years of education: Not on file  ? Highest education level: Not on file  ?Occupational History  ? Not on file  ?Tobacco Use  ? Smoking status: Every Day  ?  Packs/day: 0.25  ?  Years: 31.00  ?  Pack years: 7.75  ?  Types: Cigarettes  ? Smokeless tobacco: Never  ?Vaping Use  ? Vaping Use: Never used  ?Substance and Sexual Activity  ? Alcohol use: Not Currently  ?  Alcohol/week: 2.0 standard drinks  ?  Types: 2 Cans of beer per week  ? Drug use: No  ? Sexual activity: Not  Currently  ?Other Topics Concern  ? Not on file  ?Social History Narrative  ? Hair dresser   ? Lives with husband and 25 yo daughter  ? High school degree and cosmetology school   ? Right handed   ? Caffeine- 1 cup of coffee, 1 soda   ? Pets: 1 dog inside   ? ?Social Determinants of Health  ? ?Financial Resource Strain: Not on file  ?Food Insecurity: Food Insecurity Present  ? Worried About Charity fundraiser in the Last Year: Sometimes true  ? Ran Out of Food in the Last Year: Sometimes true  ?Transportation Needs: No Transportation Needs  ? Lack of Transportation (Medical): No  ? Lack of Transportation (Non-Medical): No  ?Physical Activity: Not on  file  ?Stress: Not on file  ?Social Connections: Not on file  ? ? ? ?Review of Systems currently denies fever, chest pain, dyspnea, cough, back pain, nausea, vomiting or bleeding.  She does have occasional headaches and left lower quadrant tenderness at wound site ? ?Vital Signs: ?BP 140/79   Pulse (!) 55   Temp 97.7 ?F (36.5 ?C) (Oral)   Resp 14   Ht '5\' 6"'$  (1.676 m)   Wt 185 lb (83.9 kg)   SpO2 99%   BMI 29.86 kg/m?  ? ?Physical Exam awake, alert.  Chest clear to auscultation bilaterally.  Heart with slightly bradycardic but regular rhythm.  Abdomen soft, positive bowel sounds, left lower abdominal wound site covered with gauze dressing.  Site tender to palpation.  No lower extremity edema. ? ?Imaging: ?MR ABDOMEN W WO CONTRAST ? ?Result Date: 06/20/2021 ?CLINICAL DATA:  Renal mass on CT EXAM: MRI ABDOMEN WITHOUT AND WITH CONTRAST TECHNIQUE: Multiplanar multisequence MR imaging of the abdomen was performed both before and after the administration of intravenous contrast. CONTRAST:  72m GADAVIST GADOBUTROL 1 MMOL/ML IV SOLN COMPARISON:  CT abdomen/pelvis dated 06/19/2021 FINDINGS: Lower chest: Lung bases are clear. Hepatobiliary: Liver is within normal limits. No suspicious/enhancing hepatic lesions. Status post cholecystectomy. No intrahepatic or extrahepatic  ductal dilatation. Pancreas:  Within normal limits. Spleen:  Within normal limits. Adrenals/Urinary Tract:  Adrenal glands are within normal limits. Right kidney is within normal limits. 3.0 x 2.2 x 2.3 cm complex cystic les

## 2021-06-21 NOTE — Progress Notes (Signed)
Left upper extremity venous duplex has been completed. ?Preliminary results can be found in CV Proc through chart review.  ?Results were given to the patient's nurse, Melissa. ? ?06/21/21 9:14 AM ?Carlos Levering RVT   ?

## 2021-06-21 NOTE — Progress Notes (Addendum)
?   ? ? ? ? ?[facility] ? ?Date of Admission:  06/18/2021    ? ? ?Total days of antibiotics 4 ? Vancomycin 4/24 >> 4/6 ? Linezolid 4/26 >> ?        ? ?ASSESSMENT: ?Melissa Shepherd is a 51 y.o. 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 Previously with Tetracycline and Trimethoprim-sulfa resistant strains in February and April of this year. Fortunately she has been responding well to current treatment and she seems to be tolerating the linezolid so far. She has an appointment with Dr. Baxter Flattery on May 11th in the afternoon set up to ensure the abdominal wound infection has resolved.  ? ?She has not attempted decolonization for MRSA before - we can help her with recommendations at her outpatient appointment but reasonable to start her on mupirocin BID now x 5d. Would not do the Hibiclens / chg wipes until her abdominal wound heals over a bit more.  ? ?CT biopsy tomorrow for possible neoplasm identified on scans here.  ? ?Medication monitoring = Platelets stable, no concern over co-administration with sertraline currently but will need ongoing monitoring.  ? ? ?PLAN: ?Linezolid to complete 2 weeks of treatment  ?FU arranged in ID clinic with Dr. Baxter Flattery  ? ? ?Principal Problem: ?  Abdominal wall cellulitis ?Active Problems: ?  Essential hypertension ?  Mixed hyperlipidemia ?  Malignant neoplasm of central portion of right breast in female, estrogen receptor positive ([place]) ?  Open abdominal wall wound ?  Hypokalemia ?  Lesion of left native kidney ?  Cellulitis ? ? ? aspirin EC  81 mg Oral Daily  ? colestipol  2 g Oral Q12H  ? enoxaparin (LOVENOX) injection  40 mg Subcutaneous Q24H  ? exemestane  25 mg Oral Daily  ? hydrochlorothiazide  12.5 mg Oral Daily  ? linezolid  600 mg Oral Q12H  ? loratadine  10 mg Oral Daily  ? nicotine  14 mg Transdermal Daily  ? potassium chloride  20 mEq Oral Daily  ? rosuvastatin  40 mg Oral QHS  ? ? ?SUBJECTIVE: ?Skin  infections are doing better. Lower legs are dried up and not draining. She does have a spot in her left axilla that is no longer draining but has some cording noted in the armpit that extends to her arm that is a little tender. She is doing well with Linezolid and has only noticed a little dry mouth.  ? ?She is going to have a biopsy of kidney lesion tomorrow after MRI showed concern over possible neoplasm.  ? ?We spent time about measures to take at home to prevent further infections.  ? ? ?Review of Systems: ?Review of Systems  ?Constitutional:  Negative for fever and weight loss.  ?Musculoskeletal:  Negative for joint pain.  ?Skin:  Positive for rash (multiple areas of skin infections).  ? ?Allergies  ?Allergen Reactions  ? Ezetimibe Other (See Comments)  ?  Unknown reaction ?  ? Prednisone Anxiety  ?  Psychosis (intolerance)  ? Morphine   ?  ineffective  ? ? ?OBJECTIVE: ?Vitals:  ? 06/20/21 0530 06/20/21 1331 06/20/21 2219 06/21/21 1441  ?BP: 127/84 134/83 140/79 (!) 145/87  ?Pulse: (!) 52 (!) 58 (!) 55 (!) 58  ?Resp: '18 16 14   '$ ?Temp: (!) 97.4 ?F (36.3 ?C) 97.7 ?F (36.5 ?C) 97.7 ?F (36.5 ?C) 98.2 ?F (36.8 ?C)  ?TempSrc: Oral Oral Oral Oral  ?SpO2: 97% 99% 99%  99%  ?Weight:      ?Height:      ? ?Body mass index is 29.86 kg/m?. ? ?Physical Exam ?Vitals reviewed.  ?Cardiovascular:  ?   Rate and Rhythm: Normal rate.  ?Pulmonary:  ?   Effort: Pulmonary effort is normal.  ?Abdominal:  ?   General: There is no distension.  ?   Palpations: Abdomen is soft.  ?Skin: ?   General: Skin is warm and dry.  ?   Comments: Left axilla with non-draining boil that is no longer red. She does have some cording palpated extending down toward her elbow that is a little tender but not warm or red.  ?Leg incisions are dried up and calmed down ?Abdominal incision is much improved in comparison to initial photos.   ?Neurological:  ?   Mental Status: She is alert and oriented to person, place, and time.  ? ? ?Lab Results ?Lab Results   ?Component Value Date  ? WBC 6.2 06/20/2021  ? HGB 13.2 06/20/2021  ? HCT 39.0 06/20/2021  ? MCV 88.0 06/20/2021  ? PLT 179 06/20/2021  ?  ?Lab Results  ?Component Value Date  ? CREATININE 0.72 06/20/2021  ? BUN 10 06/20/2021  ? NA 143 06/20/2021  ? K 3.5 06/20/2021  ? CL 111 06/20/2021  ? CO2 27 06/20/2021  ?  ?Lab Results  ?Component Value Date  ? ALT 16 06/20/2021  ? AST 19 06/20/2021  ? ALKPHOS 44 06/20/2021  ? BILITOT 0.5 06/20/2021  ?  ? ?Microbiology: ?Recent Results (from the past 240 hour(s))  ?Culture, blood (Routine X 2) w Reflex to ID Panel     Status: None (Preliminary result)  ? Collection Time: 06/19/21  9:52 AM  ? Specimen: BLOOD LEFT HAND  ?Result Value Ref Range Status  ? Specimen Description   Final  ?  BLOOD LEFT HAND ?Performed at [facility], [place] [address], [place], [place] [address] ?  ? Special Requests   Final  ?  BOTTLES DRAWN AEROBIC AND ANAEROBIC Blood Culture adequate volume ?Performed at [facility], [place] [address] [place], [place] [address] ?  ? Culture   Final  ?  NO GROWTH 2 DAYS ?Performed at [facility], [place] [address], [place], [place] [address] ?  ? Report Status PENDING  Incomplete  ?Culture, blood (Routine X 2) w Reflex to ID Panel     Status: None (Preliminary result)  ? Collection Time: 06/19/21  9:52 AM  ? Specimen: BLOOD  ?Result Value Ref Range Status  ? Specimen Description   Final  ?  BLOOD BLOOD LEFT FOREARM ?Performed at [facility], [place] [address] [place], [place] [address] ?  ? Special Requests   Final  ?  IN PEDIATRIC BOTTLE Blood Culture results may not be optimal due to an inadequate volume of blood received in culture bottles ?Performed at [facility], [place] [address] [place], [place] [address] ?  ? Culture   Final  ?  NO GROWTH 2 DAYS ?Performed at [facility], [place] [address], [place], [place] [address] ?  ? Report Status PENDING  Incomplete  ?Aerobic Culture w Gram  Stain (superficial specimen)     Status: None  ? Collection Time: 06/19/21  2:00 PM  ? Specimen: Wound  ?Result Value Ref Range Status  ? Specimen Description   Final  ?  WOUND ?Performed at [facility], [address]  [place], [place] [address] ?  ? Special Requests   Final  ?  ABDOMEN ?Performed at [facility], [place] [address], [place], [place] [address] ?  ? Gram Stain   Final  ?  ABUNDANT WBC PRESENT, PREDOMINANTLY PMN ?RARE GRAM POSITIVE COCCI ?Performed at [facility], [place] [address], [place], [place] [address] ?  ? Culture FEW METHICILLIN RESISTANT STAPHYLOCOCCUS AUREUS  Final  ? Report Status 06/21/2021 FINAL  Final  ? Organism ID, Bacteria METHICILLIN RESISTANT STAPHYLOCOCCUS AUREUS  Final  ?    Susceptibility  ? Methicillin resistant staphylococcus aureus - MIC*  ?  CIPROFLOXACIN >=8 RESISTANT Resistant   ?  ERYTHROMYCIN >=8 RESISTANT Resistant   ?  GENTAMICIN <=0.5 SENSITIVE Sensitive   ?  OXACILLIN >=4 RESISTANT Resistant   ?  TETRACYCLINE <=1 SENSITIVE Sensitive   ?  VANCOMYCIN <=0.5 SENSITIVE Sensitive   ?  TRIMETH/SULFA >=320 RESISTANT Resistant   ?  CLINDAMYCIN <=0.25 SENSITIVE Sensitive   ?  RIFAMPIN <=0.5 SENSITIVE Sensitive   ?  Inducible Clindamycin NEGATIVE Sensitive   ?  * FEW METHICILLIN RESISTANT STAPHYLOCOCCUS AUREUS  ? ? ?Janene Madeira, MSN, NP-C ?[facility] ?[facility]  ?[email or web address] ?Pager: [phone] ?Office: [phone] ?[facility] Main Line: [phone] ?*Secure Chat Communication Welcome  ?

## 2021-06-21 NOTE — Progress Notes (Signed)
[facility] ? ?PROGRESS NOTE ? ?Melissa Shepherd:[id number] DOB: 1971/01/14 DOA: 06/18/2021 ?PCP: Remi Haggard, FNP ? ? 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 ?Patient stated that she had an elevated area of redness and swelling in her lower abdominal wall in first week of April.  She went to [facility] for evaluation at that time and was given a dose of intravenous vancomycin and was discharged home on course of Bactrim. ?Patient states that despite taking Bactrim she experienced worsening and enlargement of abdominal wound that did not resolve with Bactrim.  As per patient wound has been enlarging and has been actively draining yellow drainage.  This was associated with moderate to severe abdominal pain. ?In the ED patient was found to be suffering from ongoing ulceration abdominal wall cellulitis and was started on IV vancomycin. ? ? ? ?Subjective  ? ?Patient seen and examined, venous duplex of left upper extremity shows basilic vein thrombosis.  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 ? ? Assessment/Plan:  ? ? ?Abdominal wall ulcer/cellulitis ?-Patient had failed outpatient therapy with Bactrim ?-CT abdomen/pelvis showed no evidence of underlying abscess ?-Continue as needed pain with opioids ?-Blood cultures ordered ?-Wound culture grew Staph aureus ?-CRP 0.5 ?-ID was consulted, IV vancomycin has been changed to p.o. Zyvox ?-ID recommends 2 weeks of Zyvox ? 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 Diona Fanti ?-We will obtain IR consult for renal biopsy ?-Patient to follow-up with urology as outpatient ? ?Left basilic vein thrombosis ?-Venous duplex of left upper extremity shows basilic vein thrombosis only in the distal upper arm ?-Discussed with Dr. Stanford Breed, vascular surgeon; he just recommends taking aspirin, warm compresses ?-No indication for anticoagulation ? ?Hypokalemia ?-Secondary to poor p.o. intake in setting of taking HCTZ ?-Replete ? ?Hypertension ?-Blood pressure is well controlled, continue HCTZ ? ?Hyperlipidemia ?-Continue rosuvastatin ? ?Malignant neoplasm of central portion of right breast in female, estrogen receptor positive ?-Patient receiving outpatient monthly Lupron injections ?-Additionally receiving daily Aromasin tablets ? ? ? ?Medications ? ?  ? colestipol  2 g Oral Q12H  ? enoxaparin (LOVENOX) injection  40 mg Subcutaneous Q24H  ? exemestane  25 mg Oral Daily  ? hydrochlorothiazide  12.5 mg Oral Daily  ? linezolid  600 mg Oral Q12H  ? loratadine  10 mg Oral Daily  ? nicotine  14 mg Transdermal Daily  ? rosuvastatin  40 mg Oral QHS  ? ? ? Data Reviewed:  ? ?CBG: ? ?No results for input(s): GLUCAP in the last 168 hours. ? ?SpO2: 99 %  ? ? ?Vitals:  ? 06/19/21 2208 06/20/21 0530 06/20/21 1331 06/20/21 2219  ?BP: (!) 151/83 127/84 134/83 140/79  ?Pulse: (!) 55 (!) 52 (!) 58 (!) 55  ?Resp: (!) '22 18 16 14  '$ ?Temp: (!) 97.4 ?F (36.3 ?C) (!) 97.4 ?F (36.3 ?C) 97.7 ?F (36.5 ?C)  97.7 ?F (36.5 ?C)  ?TempSrc: Oral Oral Oral Oral  ?SpO2: 100% 97% 99% 99%  ?Weight:      ?Height:      ? ? ? ? ?Data Reviewed: ? ?Basic Metabolic Panel: ?Recent Labs  ?Lab 06/18/21 ?2303 06/19/21 ?6759 06/20/21 ?1638  ?NA 140 142 143  ?K 3.2* 3.6 3.5  ?CL 107 110 111  ?CO2 '28 26 27  '$ ?GLUCOSE 85 88 95  ?BUN '9 9 10   '$ ?CREATININE 0.74 0.58 0.72  ?CALCIUM 9.3 8.9 9.1  ?MG  --  1.8  --   ? ? ?CBC: ?Recent Labs  ?Lab 06/18/21 ?2303 06/19/21 ?4665 06/20/21 ?9935  ?WBC 8.0 5.9 6.2  ?NEUTROABS 4.1 3.0 2.9  ?HGB 13.0 13.2 13.2  ?HCT 38.5 39.9 39.0  ?MCV 90.4 90.3 88.0  ?PLT 204 200 179  ? ? ?LFT ?Recent Labs  ?Lab 06/19/21 ?0952 06/20/21 ?7017  ?AST 19 19  ?ALT 19 16  ?ALKPHOS 41 44  ?BILITOT 0.6 0.5  ?PROT 6.0* 5.9*  ?ALBUMIN 3.8 3.5  ? ?  ?Antibiotics: ?Anti-infectives (From admission, onward)  ? ? Start     Dose/Rate Route Frequency Ordered Stop  ? 06/20/21 1515  linezolid (ZYVOX) tablet 600 mg       ? 600 mg Oral Every 12 hours 06/20/21 1418    ? 06/19/21 1200  vancomycin (VANCOCIN) IVPB 1000 mg/200 mL premix  Status:  Discontinued       ? 1,000 mg ?200 mL/hr over 60 Minutes Intravenous Every 12 hours 06/19/21 0838 06/20/21 1418  ? 06/18/21 2315  vancomycin (VANCOREADY) IVPB 1750 mg/350 mL       ? 1,750 mg ?175 mL/hr over 120 Minutes Intravenous  Once 06/18/21 2305 06/19/21 0304  ? ?  ? ? ? ?DVT prophylaxis: Lovenox ? ?Code Status: Full code ? ?Family Communication: No family at bedside ? ? ?CONSULTS infectious disease ? ? ?Objective  ? ? ?Physical Examination: ? ?General-appears in no acute distress ?Heart-S1-S2, regular, no murmur auscultated ?Lungs-clear to auscultation bilaterally, no wheezing or crackles auscultated ?Abdomen-soft, nontender, no organomegaly ?Extremities-no edema in the lower extremities ?Neuro-alert, oriented x3, no focal deficit noted ?Skin-abdominal wall ulcer in dressing ? ? ? ?Status is: Inpatient: Abdominal wall ulcer ? ? ? ?  ? ? ? ? ? ?Oswald Hillock ?  ?[facility] ?If 7PM-7AM, please contact night-coverage at [email or web address], ?Office  [phone] ? ? ?06/21/2021, 1:21 PM  LOS: 1 day  ? ? ? ? ? ? ? ? ? ? ?  ?

## 2021-06-21 NOTE — Telephone Encounter (Signed)
Pt LVMOM stating that she had an appt today @ 3:00 and 4:00 butr she is currently in the hospital at [facility]. She wants to know if we can keep appt until she finds out if she will be released. I ATC - no answer and no VM was able to be left due to VM being full.  ?

## 2021-06-21 NOTE — TOC Benefit Eligibility Note (Signed)
Patient Advocate Encounter ? ?Insurance verification completed.   ? ?The patient is currently admitted and upon discharge could be taking linezolid (Zyvox) 600 mg tablets. ? ?The current 14 day co-pay is, $0.00.  ? ?The patient is insured through [employer]  ? ? ? ?Lyndel Safe, CPhT ?Pharmacy Patient Advocate Specialist ?[facility] Patient Advocate Team ?Direct Number: [phone]  Fax: [phone] ? ? ? ? ? ?  ?

## 2021-06-22 ENCOUNTER — Other Ambulatory Visit (HOSPITAL_COMMUNITY): Payer: Self-pay

## 2021-06-22 DIAGNOSIS — I10 Essential (primary) hypertension: Secondary | ICD-10-CM | POA: Diagnosis not present

## 2021-06-22 DIAGNOSIS — N289 Disorder of kidney and ureter, unspecified: Secondary | ICD-10-CM | POA: Diagnosis not present

## 2021-06-22 DIAGNOSIS — L03311 Cellulitis of abdominal wall: Secondary | ICD-10-CM | POA: Diagnosis not present

## 2021-06-22 DIAGNOSIS — E876 Hypokalemia: Secondary | ICD-10-CM | POA: Diagnosis not present

## 2021-06-22 MED ORDER — ASPIRIN 81 MG PO TBEC
81.0000 mg | DELAYED_RELEASE_TABLET | Freq: Every day | ORAL | 11 refills | Status: DC
  Filled 2021-06-22: qty 30, 30d supply, fill #0

## 2021-06-22 MED ORDER — OXYCODONE-ACETAMINOPHEN 5-325 MG PO TABS
1.0000 | ORAL_TABLET | ORAL | 0 refills | Status: DC | PRN
  Filled 2021-06-22: qty 20, 4d supply, fill #0

## 2021-06-22 MED ORDER — LINEZOLID 600 MG PO TABS
600.0000 mg | ORAL_TABLET | Freq: Two times a day (BID) | ORAL | 0 refills | Status: DC
Start: 2021-06-22 — End: 2021-07-24
  Filled 2021-06-22: qty 21, 11d supply, fill #0

## 2021-06-22 NOTE — Discharge Summary (Signed)
?Physician Discharge Summary ?  ?Patient: Melissa Shepherd MRN: [id number] DOB: [birth year]  ?Admit date:     06/18/2021  ?Discharge date: 06/22/21  ?Discharge Physician: Oswald Hillock  ? ?PCP: Remi Haggard, FNP  ? ?Recommendations at discharge:  ? ?Follow-up urology as outpatient ?Follow-up infectious disease outpatient ?Follow-up results of renal biopsy as outpatient ? ? ?Discharge Diagnoses: ?Principal Problem: ?  Abdominal wall cellulitis ?Active Problems: ?  Open abdominal wall wound ?  Lesion of left native kidney ?  Hypokalemia ?  Essential hypertension ?  Mixed hyperlipidemia ?  Malignant neoplasm of central portion of right breast in female, estrogen receptor positive ([place]) ?  Cellulitis ? ?Resolved Problems: ?  * No resolved hospital problems. * ? 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 ?Patient stated that she had an elevated area of redness and swelling in her lower abdominal wall in first week of April.  She went to [facility] for evaluation at that time and was given a dose of intravenous vancomycin and was discharged home on course of Bactrim. ?Patient states that despite taking Bactrim she experienced worsening and enlargement of abdominal wound that did not resolve with Bactrim.  As per patient wound has been enlarging and has been actively draining yellow drainage.  This was associated with moderate to severe abdominal pain. ?In the ED patient was found to be suffering from ongoing ulceration abdominal wall cellulitis and was started on IV vancomycin. ? ?Assessment and Plan: ? ? ?Abdominal wall ulcer/cellulitis ?-Patient had failed outpatient therapy with Bactrim ?-CT abdomen/pelvis showed no evidence of underlying abscess ?-Continue as needed  pain with opioids ?-Blood cultures ordered ?-Wound culture grew Staph aureus ?-CRP 0.5 ?-ID was consulted, IV vancomycin has been changed to p.o. Zyvox ?-ID recommends 2 weeks of Zyvox ?-We will discharge on Zyvox for 12 more days ?  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 Diona Fanti ?-IR was consulted for renal biopsy.  Biopsy obtained, result pending. ?-Patient to follow-up with urology as outpatient ?  ?Left basilic vein thrombosis ?-Venous duplex of left upper extremity shows basilic vein thrombosis only in the distal upper arm ?-Discussed with Dr. Stanford Breed, vascular surgeon; he just recommends taking aspirin, warm compresses ?-No indication for anticoagulation ?-Restart aspirin 81 mg p.o. daily ?  ?Hypokalemia ?-Secondary to poor p.o. intake in setting of taking HCTZ ?-Replete ?  ?Hypertension ?-Blood pressure is well controlled, continue HCTZ ?  ?Hyperlipidemia ?-Continue rosuvastatin ?  ?Malignant neoplasm of central portion of right breast in female, estrogen receptor positive ?-Patient receiving outpatient monthly Lupron injections ?-Additionally receiving daily Aromasin tablets ?  ?  ? ?  ? ? ?Consultants: Infectious disease, urology ?Procedures performed: Renal biopsy ?Disposition: Home ?Diet recommendation:  ?Regular diet ?DISCHARGE MEDICATION: ?Allergies as of 06/22/2021   ? ?   Reactions  ? Ezetimibe Other (See Comments)  ? Unknown reaction  ? Prednisone Anxiety  ? Psychosis (intolerance)  ? Morphine   ? ineffective  ? ?  ? ?  ?  Medication List  ?  ? ?STOP taking these medications   ? ?furosemide 20 MG tablet ?Commonly known as: LASIX ?  ?meloxicam 15 MG  tablet ?Commonly known as: MOBIC ?  ? ?  ? ?TAKE these medications   ? ?ALPRAZolam 1 MG tablet ?Commonly known as: Duanne Moron ?Take 1 mg by mouth 3 (three) times daily. ?  ?amLODipine 10 MG tablet ?Commonly known as: NORVASC ?Take 10 mg by mouth daily. ?  ?amphetamine-dextroamphetamine 20 MG tablet ?Commonly known as: ADDERALL ?Take 20 mg by mouth 2 (two) times daily. ?  ?ascorbic acid 250 MG Chew ?Commonly known as: VITAMIN C ?Chew 500 mg by mouth at bedtime. ?  ?aspirin 81 MG EC tablet ?Take 1 tablet (81 mg total) by mouth daily. Swallow whole. ?  ?CALCIUM PO ?Take 600 mg by mouth at bedtime. ?  ?colestipol 1 g tablet ?Commonly known as: COLESTID ?Take 1-2 g by mouth 2 (two) times daily. ?  ?exemestane 25 MG tablet ?Commonly known as: AROMASIN ?TAKE 1 TABLET (25 MG TOTAL) BY MOUTH DAILY. ?What changed: when to take this ?  ?folic acid 1 MG tablet ?Commonly known as: FOLVITE ?Take 1 mg by mouth at bedtime. ?  ?gabapentin 300 MG capsule ?Commonly known as: NEURONTIN ?Take 300 mg by mouth 3 (three) times daily. ?  ?GERITOL PO ?Take 1 tablet by mouth daily. ?  ?hydrochlorothiazide 12.5 MG tablet ?Commonly known as: HYDRODIURIL ?TAKE 1 TABLET BY MOUTH DAILY. ?  ?linezolid 600 MG tablet ?Commonly known as: ZYVOX ?Take 1 tablet (600 mg total) by mouth every 12 (twelve) hours. ?  ?metoprolol succinate 100 MG 24 hr tablet ?Commonly known as: TOPROL-XL ?Take 100 mg by mouth daily. ?  ?nystatin 100000 UNIT/ML suspension ?Commonly known as: MYCOSTATIN ?Use as directed 5 mLs (500,000 Units total) in the mouth or throat 4 (four) times daily as needed (Oral irritation or Oral thrush). ?  ?oxyCODONE-acetaminophen 5-325 MG tablet ?Commonly known as: PERCOCET/ROXICET ?Take 1 tablet by mouth every 4 (four) hours as needed for moderate pain. ?  ?potassium chloride SA 20 MEQ tablet ?Commonly known as: KLOR-CON M ?Take 20 mEq by mouth at bedtime. ?  ?rosuvastatin 40 MG tablet ?Commonly known as: CRESTOR ?Take 40 mg by mouth at bedtime. ?   ?sertraline 100 MG tablet ?Commonly known as: ZOLOFT ?Take 100 mg by mouth at bedtime. ?  ?Vitamin D-3 125 MCG (5000 UT) Tabs ?Take 2 tablets by mouth at bedtime. ?  ? ?  ? ? ?Discharge Exam: ?Danley Danker Weights  ? 06/18/21 2002  ?Weight: 83.9 kg  ? ?General-appears in no acute distress ?Heart-S1-S2, regular, no murmur auscultated ?Lungs-clear to auscultation bilaterally, no wheezing or crackles auscultated ?Abdomen-soft, nontender, no organomegaly ?Extremities-no edema in the lower extremities ?Neuro-alert, oriented x3, no focal deficit noted ?Skin-abdominal wall wound and dressing ? ?Condition at discharge: good ? ?The results of significant diagnostics from this hospitalization (including imaging, microbiology, ancillary and laboratory) are listed below for reference.  ? ?Imaging Studies: ?MR ABDOMEN W WO CONTRAST ? ?Result Date: 06/20/2021 ?CLINICAL DATA:  Renal mass on CT EXAM: MRI ABDOMEN WITHOUT AND WITH CONTRAST TECHNIQUE: Multiplanar multisequence MR imaging of the abdomen was performed both before and after the administration of intravenous contrast. CONTRAST:  43m GADAVIST GADOBUTROL 1 MMOL/ML IV SOLN COMPARISON:  CT abdomen/pelvis dated 06/19/2021 FINDINGS: Lower chest: Lung bases are clear. Hepatobiliary: Liver is within normal limits. No suspicious/enhancing hepatic lesions. Status post cholecystectomy. No intrahepatic or extrahepatic ductal dilatation. Pancreas:  Within normal limits.  Spleen:  Within normal limits. Adrenals/Urinary Tract:  Adrenal glands are within normal limits. Right kidney is within normal limits. 3.0 x 2.2 x 2.3 cm complex cystic lesion in the left lower kidney with at least 2 thin enhancing septations (series 21/images 47 and 52). No solid component/mural nodularity. Bosniak III. Stomach/Bowel: Stomach is within normal limits. Visualized bowel is unremarkable. Vascular/Lymphatic:  No evidence of abdominal aortic aneurysm. Accessory lower pole renal artery. Single left renal vein. No  renal vein invasion. No suspicious regional lymphadenopathy. Other:  No abdominal ascites. Musculoskeletal: No focal osseous lesions. IMPRESSION: 3.0 cm complex cystic lesion in the left lower kidney with en

## 2021-06-22 NOTE — Plan of Care (Signed)

## 2021-06-22 NOTE — Discharge Instructions (Signed)
Hold Adderall while you are taking antibiotic Zyvox.  Start taking Adderall after 12 days. ?

## 2021-06-23 ENCOUNTER — Encounter: Payer: Self-pay | Admitting: Hematology and Oncology

## 2021-06-23 ENCOUNTER — Other Ambulatory Visit: Payer: Self-pay | Admitting: Hematology and Oncology

## 2021-06-24 LAB — CULTURE, BLOOD (ROUTINE X 2)
Culture: NO GROWTH
Culture: NO GROWTH
Special Requests: ADEQUATE

## 2021-06-25 MED ORDER — EXEMESTANE 25 MG PO TABS: 25.0000 mg | ORAL_TABLET | Freq: Every day | ORAL | 0 refills | Status: DC

## 2021-06-26 ENCOUNTER — Telehealth: Payer: Self-pay | Admitting: Hematology and Oncology

## 2021-06-26 ENCOUNTER — Other Ambulatory Visit (INDEPENDENT_AMBULATORY_CARE_PROVIDER_SITE_OTHER): Payer: Self-pay | Admitting: Vascular Surgery

## 2021-06-26 DIAGNOSIS — M79669 Pain in unspecified lower leg: Secondary | ICD-10-CM

## 2021-06-26 LAB — SURGICAL PATHOLOGY

## 2021-06-26 NOTE — Telephone Encounter (Signed)
Scheduled appointment per provider. Left message. 

## 2021-06-27 DIAGNOSIS — I890 Lymphedema, not elsewhere classified: Secondary | ICD-10-CM | POA: Insufficient documentation

## 2021-06-27 NOTE — Progress Notes (Signed)
? ? ? ? ?MRN : [id number] ? ?Melissa Shepherd is a 51 y.o. (05-07-1970) female who presents with chief complaint of legs swell. ? ?History of Present Illness: The patient returns to the office for followup evaluation regarding leg swelling.  The swelling has persisted and the pain associated with swelling continues. There have not been any interval development of a ulcerations or wounds. ? ?Since the previous visit the patient has been wearing graduated compression stockings and has noted little if any improvement in the lymphedema. The patient has been using compression routinely morning until night. ? ?The patient also states elevation during the day and exercise is being done too.  ? ?No outpatient medications have been marked as taking for the 06/28/21 encounter (Appointment) with Delana Meyer, Dolores Lory, MD.  ? ? ?Past Medical History:  ?Diagnosis Date  ? Anginal pain ([place])   ? Anxiety   ? panic attacks  ? Cancer [facility])   ? Chicken pox   ? Cholelithiasis with acute on chronic cholecystitis without biliary obstruction 11/26/2016  ? Depression   ? Diarrhea   ? Dyspnea   ? with chest pain  ? Fatty liver   ? Frequent headaches   ? Hypertension   ? IBS (irritable bowel syndrome)   ? Multiple thyroid nodules   ? Seizures ([place])   ? pregenancy- toxemia- 1991  ? ? ?Past Surgical History:  ?Procedure Laterality Date  ? CESAREAN SECTION  1991. 2003.  ? 2  ? CHOLECYSTECTOMY N/A 11/26/2016  ? Procedure: LAPAROSCOPIC CHOLECYSTECTOMY WITH INTRAOPERATIVE CHOLANGIOGRAM;  Surgeon: Fanny Skates, MD;  Location: [place];  Service: General;  Laterality: N/A;  ? MASTECTOMY Right   ? WISDOM TOOTH EXTRACTION    ? ? ?Social History ?Social History  ? ?Tobacco Use  ? Smoking status: Every Day  ?  Packs/day: 0.25  ?  Years: 31.00  ?  Pack years: 7.75  ?  Types: Cigarettes  ? Smokeless tobacco: Never  ?Vaping Use  ? Vaping Use: Never used  ?Substance Use Topics  ? Alcohol use: Not Currently  ?  Alcohol/week: 2.0 standard drinks  ?  Types: 2 Cans of  beer per week  ? Drug use: No  ? ? ?Family History ?Family History  ?Problem Relation Age of Onset  ? Hypertension Mother   ? Cancer Sister   ?     cervical  ? Breast cancer Paternal Aunt   ? Breast cancer Maternal Grandmother   ? Cancer Maternal Grandmother   ?     Breast  ? ? ?Allergies  ?Allergen Reactions  ? Ezetimibe Other (See Comments)  ?  Unknown reaction ?  ? Prednisone Anxiety  ?  Psychosis (intolerance)  ? Morphine   ?  ineffective  ? ? ? ?REVIEW OF SYSTEMS (Negative unless checked) ? ?Constitutional: '[]'$ Weight loss  '[]'$ Fever  '[]'$ Chills ?Cardiac: '[]'$ Chest pain   '[]'$ Chest pressure   '[]'$ Palpitations   '[]'$ Shortness of breath when laying flat   '[]'$ Shortness of breath with exertion. ?Vascular:  '[]'$ Pain in legs with walking   '[x]'$ Pain in legs with standing  '[]'$ History of DVT   '[]'$ Phlebitis   '[x]'$ Swelling in legs   '[]'$ Varicose veins   '[]'$ Non-healing ulcers ?Pulmonary:   '[]'$ Uses home oxygen   '[]'$ Productive cough   '[]'$ Hemoptysis   '[]'$ Wheeze  '[]'$ COPD   '[]'$ Asthma ?Neurologic:  '[]'$ Dizziness   '[]'$ Seizures   '[]'$ History of stroke   '[]'$ History of TIA  '[]'$ Aphasia   '[]'$ Vissual changes   '[]'$ Weakness or numbness in arm   '[]'$   Weakness or numbness in leg ?Musculoskeletal:   '[]'$ Joint swelling   '[]'$ Joint pain   '[]'$ Low back pain ?Hematologic:  '[]'$ Easy bruising  '[]'$ Easy bleeding   '[]'$ Hypercoagulable state   '[]'$ Anemic ?Gastrointestinal:  '[]'$ Diarrhea   '[]'$ Vomiting  '[]'$ Gastroesophageal reflux/heartburn   '[]'$ Difficulty swallowing. ?Genitourinary:  '[]'$ Chronic kidney disease   '[]'$ Difficult urination  '[]'$ Frequent urination   '[]'$ Blood in urine ?Skin:  '[]'$ Rashes   '[]'$ Ulcers  ?Psychological:  '[]'$ History of anxiety   '[]'$  History of major depression. ? ?Physical Examination ? ?There were no vitals filed for this visit. ?There is no height or weight on file to calculate BMI. ?Gen: WD/WN, NAD ?Head: [place]/AT, No temporalis wasting.  ?Ear/Nose/Throat: Hearing grossly intact, nares w/o erythema or drainage, pinna without lesions ?Eyes: PER, EOMI, sclera nonicteric.  ?Neck: Supple, no gross masses.   No JVD.  ?Pulmonary:  Good air movement, no audible wheezing, no use of accessory muscles.  ?Cardiac: RRR, precordium not hyperdynamic. ?Vascular:  scattered varicosities present bilaterally.  Mild venous stasis changes to the legs bilaterally.  3-4+ soft pitting edema  ?Vessel Right Left  ?Radial Palpable Palpable  ?Gastrointestinal: soft, non-distended. No guarding/no peritoneal signs.  ?Musculoskeletal: M/S 5/5 throughout.  No deformity.  ?Neurologic: CN 2-12 intact. Pain and light touch intact in extremities.  Symmetrical.  Speech is fluent. Motor exam as listed above. ?Psychiatric: Judgment intact, Mood & affect appropriate for pt's clinical situation. ?Dermatologic: Venous rashes no ulcers noted.  No changes consistent with cellulitis. ?Lymph : No lichenification or skin changes of chronic lymphedema. ? ?CBC ?Lab Results  ?Component Value Date  ? WBC 6.2 06/20/2021  ? HGB 13.2 06/20/2021  ? HCT 39.0 06/20/2021  ? MCV 88.0 06/20/2021  ? PLT 179 06/20/2021  ? ? ?BMET ?   ?Component Value Date/Time  ? NA 143 06/20/2021 0528  ? NA 140 08/03/2013 0000  ? K 3.5 06/20/2021 0528  ? CL 111 06/20/2021 0528  ? CO2 27 06/20/2021 0528  ? GLUCOSE 95 06/20/2021 0528  ? BUN 10 06/20/2021 0528  ? BUN 11 08/03/2013 0000  ? CREATININE 0.72 06/20/2021 0528  ? CREATININE 0.76 04/10/2021 1139  ? CALCIUM 9.1 06/20/2021 0528  ? GFRNONAA >60 06/20/2021 0528  ? GFRNONAA >60 04/10/2021 1139  ? GFRAA >60 05/17/2017 1943  ? ?Estimated Creatinine Clearance: 92.8 mL/min (by C-G formula based on SCr of 0.72 mg/dL). ? ?COAG ?Lab Results  ?Component Value Date  ? INR 0.9 06/21/2021  ? ? ?Radiology ?MR ABDOMEN W WO CONTRAST ? ?Result Date: 06/20/2021 ?CLINICAL DATA:  Renal mass on CT EXAM: MRI ABDOMEN WITHOUT AND WITH CONTRAST TECHNIQUE: Multiplanar multisequence MR imaging of the abdomen was performed both before and after the administration of intravenous contrast. CONTRAST:  63m GADAVIST GADOBUTROL 1 MMOL/ML IV SOLN COMPARISON:  CT  abdomen/pelvis dated 06/19/2021 FINDINGS: Lower chest: Lung bases are clear. Hepatobiliary: Liver is within normal limits. No suspicious/enhancing hepatic lesions. Status post cholecystectomy. No intrahepatic or extrahepatic ductal dilatation. Pancreas:  Within normal limits. Spleen:  Within normal limits. Adrenals/Urinary Tract:  Adrenal glands are within normal limits. Right kidney is within normal limits. 3.0 x 2.2 x 2.3 cm complex cystic lesion in the left lower kidney with at least 2 thin enhancing septations (series 21/images 47 and 52). No solid component/mural nodularity. Bosniak III. Stomach/Bowel: Stomach is within normal limits. Visualized bowel is unremarkable. Vascular/Lymphatic:  No evidence of abdominal aortic aneurysm. Accessory lower pole renal artery. Single left renal vein. No renal vein invasion. No suspicious regional lymphadenopathy. Other:  No  abdominal ascites. Musculoskeletal: No focal osseous lesions. 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 Bosniak III. Accessory lower pole renal artery. Single left renal vein. No renal vein invasion or regional lymphadenopathy. Electronically Signed   By: Julian Hy M.D.   On: 06/20/2021 21:40  ? ?CT ABDOMEN PELVIS W CONTRAST ? ?Result Date: 06/19/2021 ?CLINICAL DATA:  Abdominal wound infection EXAM: CT ABDOMEN AND PELVIS WITH CONTRAST TECHNIQUE: Multidetector CT imaging of the abdomen and pelvis was performed using the standard protocol following bolus administration of intravenous contrast. RADIATION DOSE REDUCTION: This exam was performed according to the departmental dose-optimization program which includes automated exposure control, adjustment of the mA and/or kV according to patient size and/or use of iterative reconstruction technique. CONTRAST:  14m OMNIPAQUE IOHEXOL 300 MG/ML  SOLN COMPARISON:  05/10/2017 FINDINGS: Lower chest: Lung bases are  clear.  Status post right mastectomy. Hepatobiliary: Liver is within normal limits. Status post cholecystectomy. No intrahepatic or extrahepatic ductal dilatation. Pancreas: Within normal limits. Spleen: Within

## 2021-06-28 ENCOUNTER — Encounter (INDEPENDENT_AMBULATORY_CARE_PROVIDER_SITE_OTHER): Payer: Self-pay | Admitting: Vascular Surgery

## 2021-06-28 ENCOUNTER — Ambulatory Visit (INDEPENDENT_AMBULATORY_CARE_PROVIDER_SITE_OTHER): Payer: 59 | Admitting: Vascular Surgery

## 2021-06-28 ENCOUNTER — Ambulatory Visit (INDEPENDENT_AMBULATORY_CARE_PROVIDER_SITE_OTHER): Payer: 59

## 2021-06-28 VITALS — BP 133/87 | HR 57 | Resp 17 | Ht 66.0 in

## 2021-06-28 DIAGNOSIS — M79669 Pain in unspecified lower leg: Secondary | ICD-10-CM | POA: Diagnosis not present

## 2021-06-28 DIAGNOSIS — M79661 Pain in right lower leg: Secondary | ICD-10-CM

## 2021-06-28 DIAGNOSIS — M79662 Pain in left lower leg: Secondary | ICD-10-CM

## 2021-06-28 DIAGNOSIS — I890 Lymphedema, not elsewhere classified: Secondary | ICD-10-CM | POA: Diagnosis not present

## 2021-06-28 DIAGNOSIS — I10 Essential (primary) hypertension: Secondary | ICD-10-CM | POA: Diagnosis not present

## 2021-06-28 DIAGNOSIS — R600 Localized edema: Secondary | ICD-10-CM

## 2021-06-28 DIAGNOSIS — M7989 Other specified soft tissue disorders: Secondary | ICD-10-CM

## 2021-06-28 DIAGNOSIS — F17210 Nicotine dependence, cigarettes, uncomplicated: Secondary | ICD-10-CM

## 2021-07-03 ENCOUNTER — Inpatient Hospital Stay: Payer: 59 | Attending: Hematology

## 2021-07-03 DIAGNOSIS — Z170 Estrogen receptor positive status [ER+]: Secondary | ICD-10-CM | POA: Insufficient documentation

## 2021-07-03 DIAGNOSIS — C50111 Malignant neoplasm of central portion of right female breast: Secondary | ICD-10-CM | POA: Insufficient documentation

## 2021-07-05 ENCOUNTER — Ambulatory Visit (INDEPENDENT_AMBULATORY_CARE_PROVIDER_SITE_OTHER): Payer: 59 | Admitting: Internal Medicine

## 2021-07-05 ENCOUNTER — Other Ambulatory Visit: Payer: Self-pay

## 2021-07-05 VITALS — BP 157/90 | HR 70 | Resp 16 | Ht 66.0 in | Wt 188.8 lb

## 2021-07-05 DIAGNOSIS — L03311 Cellulitis of abdominal wall: Secondary | ICD-10-CM | POA: Diagnosis not present

## 2021-07-05 NOTE — Progress Notes (Signed)
? ?RFV: hospital follow up for MRSA skin wounds ?Patient ID: Melissa Shepherd, female   DOB: December 28, 1970, 51 y.o.   MRN: [id number] ? ?HPI ?51yo F with recently hospitalized for abdominal wall cellulitis, non-healing wound. She was prescribed 14 d course of linezolid with last dose today. She is now noticing some signs of +yeast infection. Wound is improving. ? ?Outpatient Encounter Medications as of 07/05/2021  ?Medication Sig  ? ALPRAZolam (XANAX) 1 MG tablet Take 1 mg by mouth 3 (three) times daily.  ? amLODipine (NORVASC) 10 MG tablet Take 10 mg by mouth daily.  ? amphetamine-dextroamphetamine (ADDERALL) 20 MG tablet Take 20 mg by mouth 2 (two) times daily.  ? ascorbic acid (VITAMIN C) 250 MG CHEW Chew 500 mg by mouth at bedtime.  ? aspirin 81 MG EC tablet Take 1 tablet (81 mg total) by mouth daily. Swallow whole.  ? CALCIUM PO Take 600 mg by mouth at bedtime.  ? Cholecalciferol (VITAMIN D-3) 125 MCG (5000 UT) TABS Take 2 tablets by mouth at bedtime.  ? colestipol (COLESTID) 1 g tablet Take 1-2 g by mouth 2 (two) times daily.  ? exemestane (AROMASIN) 25 MG tablet Take 1 tablet (25 mg total) by mouth daily.  ? folic acid (FOLVITE) 1 MG tablet Take 1 mg by mouth at bedtime.  ? gabapentin (NEURONTIN) 300 MG capsule Take 300 mg by mouth 3 (three) times daily.  ? hydrochlorothiazide (HYDRODIURIL) 12.5 MG tablet TAKE 1 TABLET BY MOUTH DAILY. (Patient taking differently: Take 12.5 mg by mouth daily.)  ? Iron-Vitamins (GERITOL PO) Take 1 tablet by mouth daily.  ? linezolid (ZYVOX) 600 MG tablet Take 1 tablet (600 mg total) by mouth every 12 (twelve) hours.  ? metoprolol succinate (TOPROL-XL) 100 MG 24 hr tablet Take 100 mg by mouth daily.  ? nystatin (MYCOSTATIN) 100000 UNIT/ML suspension Use as directed 5 mLs (500,000 Units total) in the mouth or throat 4 (four) times daily as needed (Oral irritation or Oral thrush).  ? oxyCODONE-acetaminophen (PERCOCET/ROXICET) 5-325 MG tablet Take 1 tablet by mouth every 4 (four) hours  as needed for moderate pain.  ? potassium chloride SA (KLOR-CON M) 20 MEQ tablet Take 20 mEq by mouth at bedtime.  ? rosuvastatin (CRESTOR) 40 MG tablet Take 40 mg by mouth at bedtime.  ? sertraline (ZOLOFT) 100 MG tablet Take 100 mg by mouth at bedtime.  ? ?No facility-administered encounter medications on file as of 07/05/2021.  ?  ? ?Patient Active Problem List  ? Diagnosis Date Noted  ? Lymphedema 06/27/2021  ? Cellulitis 06/20/2021  ? Open abdominal wall wound 06/19/2021  ? Hypokalemia 06/19/2021  ? Lesion of left native kidney 06/19/2021  ? Abdominal wall cellulitis 06/18/2021  ? Pain and swelling of lower leg 04/25/2021  ? Cellulitis and abscess of left lower extremity 03/31/2021  ? Malignant neoplasm of central portion of right breast in female, estrogen receptor positive ([place]) 03/06/2021  ? Cholelithiasis with acute on chronic cholecystitis without biliary obstruction 11/26/2016  ? Anxiety 08/18/2015  ? Mixed hyperlipidemia 08/18/2015  ? Vitamin D deficiency 08/18/2015  ? IBS (irritable bowel syndrome) 05/01/2015  ? Lumbago 10/06/2014  ? Bilateral thoracic back pain 10/06/2014  ? Chest pain 07/09/2014  ? Leg swelling 07/09/2014  ? Essential hypertension 06/11/2014  ? Depression 06/11/2014  ? Tobacco use disorder 06/11/2014  ? ? ? ?Health Maintenance Due  ?Topic Date Due  ? COVID-19 Vaccine (1) Never done  ? Hepatitis C Screening  Never done  ? PAP  SMEAR-Modifier  05/31/2016  ? COLONOSCOPY (Pts 45-42yr Insurance coverage will need to be confirmed)  Never done  ?  ? ?Review of Systems ?Review of Systems  ?Constitutional: Negative for fever, chills, diaphoresis, activity change, appetite change, fatigue and unexpected weight change.  ?HENT: Negative for congestion, sore throat, rhinorrhea, sneezing, trouble swallowing and sinus pressure.  ?Eyes: Negative for photophobia and visual disturbance.  ?Respiratory: Negative for cough, chest tightness, shortness of breath, wheezing and stridor.  ?Cardiovascular:  Negative for chest pain, palpitations and leg swelling.  ?Gastrointestinal: Negative for nausea, vomiting, abdominal pain, diarrhea, constipation, blood in stool, abdominal distention and anal bleeding.  ?Genitourinary: Negative for dysuria, hematuria, flank pain and difficulty urinating.  ?Musculoskeletal: Negative for myalgias, back pain, joint swelling, arthralgias and gait problem.  ?Skin: + wound ?Neurological: Negative for dizziness, tremors, weakness and light-headedness.  ?Hematological: Negative for adenopathy. Does not bruise/bleed easily.  ?Psychiatric/Behavioral: Negative for behavioral problems, confusion, sleep disturbance, dysphoric mood, decreased concentration and agitation.  ? ?Physical Exam  ?BP (!) 157/90   Pulse 70   Resp 16   Ht '5\' 6"'$  (1.676 m)   Wt 188 lb 12.8 oz (85.6 kg)   SpO2 100%   BMI 30.47 kg/m?  ?Gen = a x o by 3 in nad ?Abd = wound bed has good granulation bed. No exudate. No surrounding erythema ?Skin = as above ?CBC ?Lab Results  ?Component Value Date  ? WBC 6.2 06/20/2021  ? RBC 4.43 06/20/2021  ? HGB 13.2 06/20/2021  ? HCT 39.0 06/20/2021  ? PLT 179 06/20/2021  ? MCV 88.0 06/20/2021  ? MCH 29.8 06/20/2021  ? MCHC 33.8 06/20/2021  ? RDW 13.0 06/20/2021  ? LYMPHSABS 2.7 06/20/2021  ? MONOABS 0.4 06/20/2021  ? EOSABS 0.2 06/20/2021  ? ? ?BMET ?Lab Results  ?Component Value Date  ? NA 143 06/20/2021  ? K 3.5 06/20/2021  ? CL 111 06/20/2021  ? CO2 27 06/20/2021  ? GLUCOSE 95 06/20/2021  ? BUN 10 06/20/2021  ? CREATININE 0.72 06/20/2021  ? CALCIUM 9.1 06/20/2021  ? GFRNONAA >60 06/20/2021  ? GFRAA >60 05/17/2017  ? ? ? ? ?Assessment and Plan ? ?Cellulitis with non-healing wound = finish course of linezolid today. Stop alginate and switch to vaseline based dressing, to change daily ? ?Will do mrsa decolonization to start in 1-2 wk with bactrim ds, mupirocin and chg bathing x 5 days ? ? ?

## 2021-07-08 ENCOUNTER — Encounter (INDEPENDENT_AMBULATORY_CARE_PROVIDER_SITE_OTHER): Payer: Self-pay | Admitting: Vascular Surgery

## 2021-07-10 ENCOUNTER — Ambulatory Visit (INDEPENDENT_AMBULATORY_CARE_PROVIDER_SITE_OTHER): Payer: 59 | Admitting: Urology

## 2021-07-10 VITALS — BP 161/99 | HR 78

## 2021-07-10 DIAGNOSIS — N2889 Other specified disorders of kidney and ureter: Secondary | ICD-10-CM

## 2021-07-10 DIAGNOSIS — N281 Cyst of kidney, acquired: Secondary | ICD-10-CM

## 2021-07-10 NOTE — Progress Notes (Signed)
H&P ? ?Chief Complaint: Complex left renal cyst ? ?History of Present Illness: Melissa Shepherd is a 51 y.o. female seen recently in consultation while in the hospital for treatment of abdominal wall cellulitis. ? ?She has a history of breast cancer (DCIS).  She is on Lupron currently.  She is admitted for management of persistent abdominal wall cellulitis.  On initial admission, she had CT abdomen and pelvis.  This revealed a 29 mm Bosniak category 3 cyst in the left central kidney.. ? ?The patient had CT scan performed in 2019.  It appears that she has a much smaller cystic lesion, perhaps 13 mm in size, in the same position on that scan.  There is no associated adenopathy on the current scan. ? ?MRI subsequently done on 06/20/2021-- ?3.0 x 2.2 x 2.3 cm complex cystic lesion in the left lower kidney ?with at least 2 thin enhancing septations (series 21/images 47 and ?52). No solid component/mural nodularity. Bosniak III. ? ?She then underwent CT directed biopsy.  This revealed no evidence of neoplasm. ? ?She is here today for outpatient follow-up.+ ?  ? ?Past Medical History:  ?Diagnosis Date  ? Anginal pain ([place])   ? Anxiety   ? panic attacks  ? Cancer [facility])   ? Chicken pox   ? Cholelithiasis with acute on chronic cholecystitis without biliary obstruction 11/26/2016  ? Depression   ? Diarrhea   ? Dyspnea   ? with chest pain  ? Fatty liver   ? Frequent headaches   ? Hypertension   ? IBS (irritable bowel syndrome)   ? Multiple thyroid nodules   ? Seizures ([place])   ? pregenancy- toxemia- 1991  ? ? ?Past Surgical History:  ?Procedure Laterality Date  ? CESAREAN SECTION  1991. 2003.  ? 2  ? CHOLECYSTECTOMY N/A 11/26/2016  ? Procedure: LAPAROSCOPIC CHOLECYSTECTOMY WITH INTRAOPERATIVE CHOLANGIOGRAM;  Surgeon: Fanny Skates, MD;  Location: [place];  Service: General;  Laterality: N/A;  ? MASTECTOMY Right   ? WISDOM TOOTH EXTRACTION    ? ? ?Home Medications:  ?(Not in a hospital admission) ? ? ?Allergies:  ?Allergies   ?Allergen Reactions  ? Ezetimibe Other (See Comments)  ?  Unknown reaction ?  ? Prednisone Anxiety  ?  Psychosis (intolerance)  ? Morphine   ?  ineffective  ? ? ?Family History  ?Problem Relation Age of Onset  ? Hypertension Mother   ? Cancer Sister   ?     cervical  ? Breast cancer Paternal Aunt   ? Breast cancer Maternal Grandmother   ? Cancer Maternal Grandmother   ?     Breast  ? ? ?Social History:  reports that she has been smoking cigarettes. She has a 7.75 pack-year smoking history. She has never used smokeless tobacco. She reports that she does not currently use alcohol after a past usage of about 2.0 standard drinks per week. She reports that she does not use drugs. ? ?ROS: ?A complete review of systems was performed.  All systems are negative except for pertinent findings as noted. ? ?Physical Exam:  ?Vital signs in last 24 hours: ?'@VSRANGES'$ @ ?General:  Alert and oriented, No acute distress ?HEENT: Normocephalic, atraumatic ?Neck: No JVD or lymphadenopathy ?Cardiovascular: Regular rate  ?Lungs: Normal inspiratory/expiratory excursion ?Abdomen: Soft, nontender, nondistended, no abdominal masses ?Back: No CVA tenderness ?Extremities: No edema ?Neurologic: Grossly intact ? ?I have reviewed prior pt notes ? ?I have reviewed notes from referring/previous physicians ? ?I have reviewed pathology results with the  patient ? ?I have independently reviewed prior imaging--CT/MRI images reviewed with the patient. ? ? ? ?Impression/Assessment:  ?Bosniak category 3 cyst in left kidney, found on recent hospitalization for abdominal wall cellulitis.  MRI evaluation was performed followed by CT directed biopsy which revealed benign pathology.  She is doing well at this point. ? ?Plan:  ?1.  I discussed with the patient the fact that we should follow this long-term.  I will have her come back in 6 months with repeat MRI ? ?2.  If the cyst recurs, consider repeat biopsy if significant abnormalities present.  Otherwise, we  will continue radiographic surveillance ? ?Lillette Boxer Shandria Clinch ?07/10/2021, 7:05 AM  ?Lillette Boxer. Mikeal Winstanley MD ? ? ?

## 2021-07-12 ENCOUNTER — Telehealth: Payer: Self-pay

## 2021-07-12 NOTE — Telephone Encounter (Signed)
Patient called needing her Bactrim, mupirocin, and CHG cloths sent to [facility]. Secure chat sent to Dr. Baxter Flattery. Dr. Baxter Flattery to send in prescriptions.   Beryle Flock, RN

## 2021-07-13 ENCOUNTER — Other Ambulatory Visit: Payer: Self-pay | Admitting: Internal Medicine

## 2021-07-13 ENCOUNTER — Telehealth: Payer: Self-pay

## 2021-07-13 MED ORDER — SULFAMETHOXAZOLE-TRIMETHOPRIM 800-160 MG PO TABS: 1.0000 | ORAL_TABLET | Freq: Two times a day (BID) | ORAL | 0 refills | Status: DC

## 2021-07-13 MED ORDER — MUPIROCIN 2 % EX OINT: 1.0000 "application " | TOPICAL_OINTMENT | Freq: Three times a day (TID) | CUTANEOUS | 0 refills | Status: AC

## 2021-07-13 MED ORDER — CHLORHEXIDINE GLUCONATE CLOTH 2 % EX PADS: 6.0000 | MEDICATED_PAD | Freq: Every day | CUTANEOUS | 0 refills | Status: DC

## 2021-07-13 NOTE — Telephone Encounter (Signed)
Patient called to ask that since the cyst on her kidney was drained does that mean the cyst is gone or will the it continue to fill with fluid?  Please advise.

## 2021-07-17 NOTE — Telephone Encounter (Signed)
Patient aware via mychart

## 2021-07-18 ENCOUNTER — Ambulatory Visit (INDEPENDENT_AMBULATORY_CARE_PROVIDER_SITE_OTHER): Payer: 59

## 2021-07-18 DIAGNOSIS — C50111 Malignant neoplasm of central portion of right female breast: Secondary | ICD-10-CM | POA: Diagnosis not present

## 2021-07-18 DIAGNOSIS — E2839 Other primary ovarian failure: Secondary | ICD-10-CM

## 2021-07-18 DIAGNOSIS — Z170 Estrogen receptor positive status [ER+]: Secondary | ICD-10-CM

## 2021-07-18 DIAGNOSIS — Z780 Asymptomatic menopausal state: Secondary | ICD-10-CM

## 2021-07-24 ENCOUNTER — Other Ambulatory Visit: Payer: Self-pay

## 2021-07-24 ENCOUNTER — Encounter: Payer: Self-pay | Admitting: Internal Medicine

## 2021-07-24 ENCOUNTER — Telehealth: Payer: 59 | Admitting: Internal Medicine

## 2021-07-24 ENCOUNTER — Telehealth (INDEPENDENT_AMBULATORY_CARE_PROVIDER_SITE_OTHER): Payer: 59 | Admitting: Internal Medicine

## 2021-07-24 DIAGNOSIS — B9562 Methicillin resistant Staphylococcus aureus infection as the cause of diseases classified elsewhere: Secondary | ICD-10-CM | POA: Diagnosis not present

## 2021-07-24 DIAGNOSIS — L089 Local infection of the skin and subcutaneous tissue, unspecified: Secondary | ICD-10-CM | POA: Diagnosis not present

## 2021-07-24 DIAGNOSIS — L03311 Cellulitis of abdominal wall: Secondary | ICD-10-CM

## 2021-07-24 MED ORDER — DOXYCYCLINE HYCLATE 100 MG PO TABS: 100.0000 mg | ORAL_TABLET | Freq: Two times a day (BID) | ORAL | 0 refills | Status: DC

## 2021-07-24 NOTE — Progress Notes (Signed)
Virtual Visit via Video Note  I connected with Melissa Shepherd on 07/24/21 at  3:15 PM EDT by a video enabled telemedicine application and verified that I am speaking with the correct person using two identifiers.  Location: Patient: at home in bathroom Provider: in clinic   I discussed the limitations of evaluation and management by telemedicine and the availability of in person appointments. The patient expressed understanding and agreed to proceed.  History of Present Illness: 51yo F with breast ca hx of abdominal wall cellulitis and poorly healing ulcer. Noticing increasing drainage and redness about wound since finishing course of abtx. She has hx of Sulfa R-MRSA   Observations/Objective:  Wound with exudate, greyish edges, mild erythema. But discoloration about wound bedw Assessment and Plan:  Will give 7d of doxy '100mg'$  bid po on full stomach Referral to wound care Have her loosely wrap her wound, suspect some tight occlusion is causing some maceration Follow Up Instructions:    I discussed the assessment and treatment plan with the patient. The patient was provided an opportunity to ask questions and all were answered. The patient agreed with the plan and demonstrated an understanding of the instructions.   The patient was advised to call back or seek an in-person evaluation if the symptoms worsen or if the condition fails to improve as anticipated.  I provided 20 minutes of non-face-to-face time during this encounter.   Carlyle Basques, MD

## 2021-07-25 ENCOUNTER — Inpatient Hospital Stay (HOSPITAL_BASED_OUTPATIENT_CLINIC_OR_DEPARTMENT_OTHER): Payer: 59 | Admitting: Hematology and Oncology

## 2021-07-25 ENCOUNTER — Encounter: Payer: Self-pay | Admitting: Hematology and Oncology

## 2021-07-25 ENCOUNTER — Other Ambulatory Visit: Payer: Self-pay

## 2021-07-25 ENCOUNTER — Inpatient Hospital Stay: Payer: 59

## 2021-07-25 DIAGNOSIS — Z170 Estrogen receptor positive status [ER+]: Secondary | ICD-10-CM

## 2021-07-25 DIAGNOSIS — C50111 Malignant neoplasm of central portion of right female breast: Secondary | ICD-10-CM

## 2021-07-25 MED ORDER — LEUPROLIDE ACETATE 3.75 MG IM KIT
3.7500 mg | PACK | Freq: Once | INTRAMUSCULAR | Status: AC
  Administered 2021-07-25: 3.75 mg via INTRAMUSCULAR
  Filled 2021-07-25: qty 3.75

## 2021-07-25 NOTE — Assessment & Plan Note (Signed)
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  She has been receiving adjuvant ovarian suppression with Lupron and Aromasin.  She has tried Arimidex as well as letrozole in the past, could not tolerated because of arthralgias.  She is switching providers because of some change in insurance.    She missed her last dose of Lupron shots since she had some kidney biopsy and was admitted for the same.  She is now here to proceed with Lupron.  She stopped taking exemestane a couple weeks ago since we have discussed in the past that antiestrogen therapy in her case is likely ineffective without ovarian suppression.  I have recommended that she stay on an every 28-day schedule for the short as best as she can and also take exemestane daily as recommended.  On exam today, and the previously thought to be oil cyst remained stable in the right mastectomy bed.  Adjacent to this nodule there is now a new nodule which was not noted on previous exam.  No other concerning examination findings.  We will proceed with ultrasound sooner than September given this new finding.  I also recommended a telephone visit in 4 weeks to review pathology results.  She will continue with antiestrogen therapy and return to clinic in 3 to 4 months

## 2021-07-25 NOTE — Progress Notes (Signed)
[facility] NOTE  Patient Care Team: Remi Haggard, FNP as PCP - General (Family Medicine)  CHIEF COMPLAINTS/PURPOSE OF CONSULTATION:  Right breast cancer  HISTORY OF PRESENTING ILLNESS:  Melissa Shepherd 51 y.o. female is here because of recent diagnosis of right breast cancer  I reviewed her records extensively and collaborated the history with the patient.  SUMMARY OF ONCOLOGIC HISTORY: Oncology History  Malignant neoplasm of central portion of right breast in female, estrogen receptor positive ([place])  01/27/2020 Cancer Staging   Staging form: Breast, AJCC 8th Edition - Pathologic stage from 01/27/2020: Stage IA (pT1a, pN0, cM0, G3, ER+, PR+, HER2-) - Signed by Truitt Merle, MD on 03/18/2021 Stage prefix: Initial diagnosis Multigene prognostic tests performed: None Histologic grading system: 3 grade system Residual tumor (R): R0 - None    03/06/2021 Initial Diagnosis   Malignant neoplasm of central portion of right breast in female, estrogen receptor positive ([place])     Treatment plan/history: 1. Status post right mastectomy with immediate reconstruction on 01/27/2020. Pathology revealed 3 mm grade 3 invasive ductal carcinoma with associated DCIS. 4 sentinel nodes negative for metastatic disease. Breast prognostic panel showed tumor to be ER +50%, moderate intensity. PR positive, 5%, weak intensity. HER-2/neu negative with Ki-67 10%.  2. Initiation of Lupron monthly March 20, 2020.  3. Initiation of adjuvant anastrozole 1 mg daily, patient discontinued secondary to myalgias and arthralgias.  4. Initiate letrozole 2.5 mg daily, 11/14/2020- 12/19/2020, discontinued secondary to myalgias and arthralgias  5. Initiation of Aromasin 25 mg 12/19/2020    Interval history  She is here for follow up. She missed her last dose of lupron because of kidney procedure. She stopped taking exemestane 2 weeks ago because she remembered that the pill doesn't work without  the shot. She denies any changes in breast otherwise. No change in breathing. No change in bowel habits, she has baseline diarrhea from IBS No change in urinary habits. NO new headaches, seizures.  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 2003.   2   CHOLECYSTECTOMY N/A 11/26/2016   Procedure: LAPAROSCOPIC CHOLECYSTECTOMY WITH INTRAOPERATIVE CHOLANGIOGRAM;  Surgeon: Fanny Skates, MD;  Location: [facility];  Service: General;  Laterality: N/A;   MASTECTOMY Right    WISDOM TOOTH EXTRACTION      SOCIAL HISTORY: Social History   Socioeconomic History   Marital status: Married    Spouse name: Not on file   Number of children: Not on file   Years of education: Not on file   Highest education level: Not on file  Occupational History   Not on file  Tobacco Use   Smoking status: Every Day    Packs/day: 0.25    Years: 31.00    Pack years: 7.75    Types: Cigarettes   Smokeless tobacco: Never  Vaping Use   Vaping Use:  Never used  Substance and Sexual Activity   Alcohol use: Not Currently    Alcohol/week: 2.0 standard drinks    Types: 2 Cans of beer per week   Drug use: No   Sexual activity: Not Currently  Other Topics Concern   Not on file  Social History Narrative   Hair dresser    Lives with husband and 35 yo daughter   High school degree and cosmetology school    Right handed    Caffeine- 1 cup of coffee, 1 soda    Pets: 1 dog inside    Social Determinants of  Radio broadcast assistant Strain: Not on file  Food Insecurity: Food Insecurity Present   Worried About Charity fundraiser in the Last Year: Sometimes true   Ran Out of Food in the Last Year: Sometimes true  Transportation Needs: No Transportation Needs   Lack of Transportation (Medical): No   Lack of Transportation (Non-Medical): No  Physical Activity: Not on file  Stress: Not on file  Social Connections: Not on file  Intimate Partner Violence: Not on file    FAMILY HISTORY: Family History  Problem Relation Age of Onset   Hypertension Mother    Cancer Sister        cervical   Breast cancer Paternal Aunt    Breast cancer Maternal Grandmother    Cancer Maternal Grandmother        Breast    ALLERGIES:  is allergic to ezetimibe, prednisone, and morphine.  MEDICATIONS:  Current Outpatient Medications  Medication Sig Dispense Refill   ALPRAZolam (XANAX) 1 MG tablet Take 1 mg by mouth 3 (three) times daily.     amLODipine (NORVASC) 10 MG tablet Take 10 mg by mouth daily.     amphetamine-dextroamphetamine (ADDERALL) 20 MG tablet Take 20 mg by mouth 2 (two) times daily.     ascorbic acid (VITAMIN C) 250 MG CHEW Chew 500 mg by mouth at bedtime.     aspirin 81 MG EC tablet Take 1 tablet (81 mg total) by mouth daily. Swallow whole. 30 tablet 11   CALCIUM PO Take 600 mg by mouth at bedtime.     Chlorhexidine Gluconate Cloth 2 % PADS Apply 6 each topically daily at 6 (six) AM. Use 1 cloth per appendage. Then use the remaining clothes for your torso. Use daily x 5 days. Regardless of time of day 30 each 0   Cholecalciferol (VITAMIN D-3) 125 MCG (5000 UT) TABS Take 2 tablets by mouth at bedtime.     colestipol (COLESTID) 1 g tablet Take 1-2 g by mouth 2 (two) times daily.     doxycycline (VIBRA-TABS) 100 MG tablet Take 1 tablet (100 mg total) by mouth 2 (two) times daily. Take on full stomach to minimize nausea 14 tablet 0   exemestane (AROMASIN) 25 MG tablet Take 1 tablet (25 mg total)  by mouth daily. 30 tablet 0   folic acid (FOLVITE) 1 MG tablet Take 1 mg by mouth at bedtime.     gabapentin (NEURONTIN) 300 MG capsule Take 300 mg by mouth 3 (three) times daily.     hydrochlorothiazide (HYDRODIURIL) 12.5 MG tablet TAKE 1 TABLET BY MOUTH DAILY. (Patient taking differently: Take 12.5 mg by mouth daily.) 30 tablet 7   Iron-Vitamins (GERITOL PO) Take 1 tablet by mouth daily.     metoprolol succinate (TOPROL-XL) 100 MG 24 hr tablet Take 100 mg by mouth daily.     mupirocin ointment (BACTROBAN) 2 %  Apply 1 application. topically 3 (three) times daily. As directed for 5 days 22 g 0   nystatin (MYCOSTATIN) 100000 UNIT/ML suspension Use as directed 5 mLs (500,000 Units total) in the mouth or throat 4 (four) times daily as needed (Oral irritation or Oral thrush). 180 mL 0   oxyCODONE-acetaminophen (PERCOCET/ROXICET) 5-325 MG tablet Take 1 tablet by mouth every 4 (four) hours as needed for moderate pain. 20 tablet 0   potassium chloride SA (KLOR-CON M) 20 MEQ tablet Take 20 mEq by mouth at bedtime.     rosuvastatin (CRESTOR) 40 MG tablet Take 40 mg by mouth at bedtime.     sertraline (ZOLOFT) 100 MG tablet Take 100 mg by mouth at bedtime.     No current facility-administered medications for this visit.    PHYSICAL EXAMINATION: ECOG PERFORMANCE STATUS: 0 - Asymptomatic  Vitals:   07/25/21 1600  BP: (!) 155/97  Pulse: (!) 113  Resp: 18  Temp: 97.9 F (36.6 C)  SpO2: 100%    Filed Weights   07/25/21 1600  Weight: 180 lb 1.6 oz (81.7 kg)    Physical Exam Constitutional:      Appearance: Normal appearance.  Cardiovascular:     Rate and Rhythm: Normal rate and regular rhythm.  Chest:     Comments: In the right breast at the 2 o'clock position, she had a nodule which was thought to be an oil cyst.  At this region, I feel an adjacent nodule which is new since her last visit.  She now has 2 nodules adjacent to each other.  No other palpable masses.  Left breast normal to  inspection and palpation.  No regional adenopathy Musculoskeletal:     Cervical back: Normal range of motion and neck supple. No rigidity.  Lymphadenopathy:     Cervical: No cervical adenopathy.  Neurological:     Mental Status: She is alert.     LABORATORY DATA:  I have reviewed the data as listed Lab Results  Component Value Date   WBC 6.2 06/20/2021   HGB 13.2 06/20/2021   HCT 39.0 06/20/2021   MCV 88.0 06/20/2021   PLT 179 06/20/2021   Lab Results  Component Value Date   NA 143 06/20/2021   K 3.5 06/20/2021   CL 111 06/20/2021   CO2 27 06/20/2021    RADIOGRAPHIC STUDIES: I have personally reviewed the radiological reports and agreed with the findings in the report.  ASSESSMENT AND PLAN:  Malignant neoplasm of central portion of right breast in female, estrogen receptor positive ([place]) This is a 67 year old premenopausal female patient with right breast invasive ductal carcinoma status post right mastectomy, final pathology showing 8 mm right breast IDC with associated DCIS, 4 sentinel nodes negative, prognostic showed ER 50% positive, PR positive, 5% weak intensity and HER2 negative with Ki-67 of 10%.  She has been receiving adjuvant ovarian suppression with Lupron and Aromasin.  She has tried Arimidex as well as letrozole in the past, could not tolerated because of arthralgias.  She is switching providers because of some change in insurance.    She missed her last dose of Lupron shots since she had some kidney biopsy and was admitted for the same.  She is now here to proceed with Lupron.  She stopped taking exemestane a couple weeks ago since we have discussed in the past that antiestrogen therapy in her case is likely ineffective without ovarian suppression.  I have recommended that she stay on an every 28-day schedule for  the short as best as she can and also take exemestane daily as recommended.  On exam today, and the previously thought to be oil cyst remained stable in the  right mastectomy bed.  Adjacent to this nodule there is now a new nodule which was not noted on previous exam.  No other concerning examination findings.  We will proceed with ultrasound sooner than September given this new finding.  I also recommended a telephone visit in 4 weeks to review pathology results.  She will continue with antiestrogen therapy and return to clinic in 3 to 4 months  I spent 30 minutes in the care of this patient reviewing history, medical records, physical examination, counseling and coordination of care. All questions were answered. The patient knows to call the clinic with any problems, questions or concerns.    Benay Pike, MD 07/25/21

## 2021-07-25 NOTE — Patient Instructions (Signed)
Leuprolide injection What is this medication? LEUPROLIDE (loo PROE lide) is a man-made hormone. It is used to treat the symptoms of prostate cancer. This medicine may also be used to treat children with early onset of puberty. It may be used for other hormonal conditions. This medicine may be used for other purposes; ask your health care provider or pharmacist if you have questions. COMMON BRAND NAME(S): Lupron What should I tell my care team before I take this medication? They need to know if you have any of these conditions: diabetes heart disease or previous heart attack high blood pressure high cholesterol pain or difficulty passing urine spinal cord metastasis stroke tobacco smoker an unusual or allergic reaction to leuprolide, benzyl alcohol, other medicines, foods, dyes, or preservatives pregnant or trying to get pregnant breast-feeding How should I use this medication? This medicine is for injection under the skin or into a muscle. You will be taught how to prepare and give this medicine. Use exactly as directed. Take your medicine at regular intervals. Do not take your medicine more often than directed. It is important that you put your used needles and syringes in a special sharps container. Do not put them in a trash can. If you do not have a sharps container, call your pharmacist or healthcare provider to get one. A special MedGuide will be given to you by the pharmacist with each prescription and refill. Be sure to read this information carefully each time. Talk to your pediatrician regarding the use of this medicine in children. While this medicine may be prescribed for children as young as 8 years for selected conditions, precautions do apply. Overdosage: If you think you have taken too much of this medicine contact a poison control center or emergency room at once. NOTE: This medicine is only for you. Do not share this medicine with others. What if I miss a dose? If you miss  a dose, take it as soon as you can. If it is almost time for your next dose, take only that dose. Do not take double or extra doses. What may interact with this medication? Do not take this medicine with any of the following medications: chasteberry cisapride dronedarone pimozide thioridazine This medicine may also interact with the following medications: herbal or dietary supplements, like black cohosh or DHEA female hormones, like estrogens or progestins and birth control pills, patches, rings, or injections female hormones, like testosterone other medicines that prolong the QT interval (abnormal heart rhythm) This list may not describe all possible interactions. Give your health care provider a list of all the medicines, herbs, non-prescription drugs, or dietary supplements you use. Also tell them if you smoke, drink alcohol, or use illegal drugs. Some items may interact with your medicine. What should I watch for while using this medication? Visit your doctor or health care professional for regular checks on your progress. During the first week, your symptoms may get worse, but then will improve as you continue your treatment. You may get hot flashes, increased bone pain, increased difficulty passing urine, or an aggravation of nerve symptoms. Discuss these effects with your doctor or health care professional, some of them may improve with continued use of this medicine. Female patients may experience a menstrual cycle or spotting during the first 2 months of therapy with this medicine. If this continues, contact your doctor or health care professional. This medicine may increase blood sugar. Ask your healthcare provider if changes in diet or medicines are needed if you have  diabetes. What side effects may I notice from receiving this medication? Side effects that you should report to your doctor or health care professional as soon as possible: allergic reactions like skin rash, itching or  hives, swelling of the face, lips, or tongue breathing problems chest pain depression or memory disorders pain in your legs or groin pain at site where injected severe headache signs and symptoms of high blood sugar such as being more thirsty or hungry or having to urinate more than normal. You may also feel very tired or have blurry vision swelling of the feet and legs visual changes vomiting Side effects that usually do not require medical attention (report to your doctor or health care professional if they continue or are bothersome): breast swelling or tenderness decrease in sex drive or performance diarrhea hot flashes loss of appetite muscle, joint, or bone pains nausea redness or irritation at site where injected skin problems or acne This list may not describe all possible side effects. Call your doctor for medical advice about side effects. You may report side effects to FDA at 1-800-FDA-1088. Where should I keep my medication? Keep out of the reach of children. Store below 25 degrees C (77 degrees F). Do not freeze. Protect from light. Do not use if it is not clear or if there are particles present. Throw away any unused medicine after the expiration date. NOTE: This sheet is a summary. It may not cover all possible information. If you have questions about this medicine, talk to your doctor, pharmacist, or health care provider.  2023 Elsevier/Gold Standard (2021-01-12 00:00:00)  

## 2021-07-26 ENCOUNTER — Telehealth: Payer: Self-pay | Admitting: Hematology and Oncology

## 2021-07-26 NOTE — Telephone Encounter (Signed)
Scheduled appointment per 05/31 los. Patient aware.

## 2021-07-27 ENCOUNTER — Other Ambulatory Visit: Payer: Self-pay | Admitting: Hematology and Oncology

## 2021-07-31 ENCOUNTER — Other Ambulatory Visit: Payer: Self-pay | Admitting: Physical Medicine and Rehabilitation

## 2021-07-31 ENCOUNTER — Telehealth: Payer: Self-pay

## 2021-07-31 ENCOUNTER — Telehealth: Payer: Self-pay | Admitting: Physical Medicine and Rehabilitation

## 2021-07-31 ENCOUNTER — Inpatient Hospital Stay: Payer: 59

## 2021-07-31 ENCOUNTER — Other Ambulatory Visit: Payer: Self-pay

## 2021-07-31 DIAGNOSIS — N6310 Unspecified lump in the right breast, unspecified quadrant: Secondary | ICD-10-CM

## 2021-07-31 NOTE — Telephone Encounter (Signed)
Appt scheduled for tomorrow. Dr. Baxter Flattery will call patient at [facility]. Leatrice Jewels, RMA

## 2021-07-31 NOTE — Telephone Encounter (Signed)
Patient is calling in try to establish Dr Ernestina Patches as her new Pain Management doctor for bulging disc in back and she usually gets steroid injections in her back but has not had any in a few months she states, patient states she is also having pain in knees is no cartilage. I advised patient she needs to have medical records sent over to [place] and she understood. Please advise patient says you can call her at [phone] if you have any questions.

## 2021-07-31 NOTE — Telephone Encounter (Signed)
Called patient regarding medication question. States she has some additional questions and would like to talk to provider. Will forward message to MD. Leatrice Jewels, RMA

## 2021-07-31 NOTE — Telephone Encounter (Signed)
Patient called office to confirm antibiotic that was sent to her pharmacy. States that it is a "weak one" and that she has been on antibiotic since January. Will reach out to provider to confirm antibiotic will call patient with provider's response. Melissa Shepherd, RMA

## 2021-08-01 ENCOUNTER — Other Ambulatory Visit: Payer: Self-pay

## 2021-08-01 ENCOUNTER — Ambulatory Visit (INDEPENDENT_AMBULATORY_CARE_PROVIDER_SITE_OTHER): Payer: 59 | Admitting: Internal Medicine

## 2021-08-01 ENCOUNTER — Encounter (HOSPITAL_BASED_OUTPATIENT_CLINIC_OR_DEPARTMENT_OTHER): Payer: 59 | Attending: Physician Assistant | Admitting: Internal Medicine

## 2021-08-01 DIAGNOSIS — L02211 Cutaneous abscess of abdominal wall: Secondary | ICD-10-CM | POA: Insufficient documentation

## 2021-08-01 DIAGNOSIS — A4902 Methicillin resistant Staphylococcus aureus infection, unspecified site: Secondary | ICD-10-CM | POA: Diagnosis not present

## 2021-08-01 DIAGNOSIS — B9562 Methicillin resistant Staphylococcus aureus infection as the cause of diseases classified elsewhere: Secondary | ICD-10-CM | POA: Insufficient documentation

## 2021-08-01 DIAGNOSIS — L03311 Cellulitis of abdominal wall: Secondary | ICD-10-CM

## 2021-08-01 DIAGNOSIS — Z853 Personal history of malignant neoplasm of breast: Secondary | ICD-10-CM | POA: Insufficient documentation

## 2021-08-01 NOTE — Progress Notes (Signed)
Virtual Visit via Telephone Note  I connected with Joycelyn Man on 08/01/21 at  1:45 PM EDT by telephone and verified that I am speaking with the correct person using two identifiers.  Location: Patient: at home Provider: at clinic   I discussed the limitations, risks, security and privacy concerns of performing an evaluation and management service by telephone and the availability of in person appointments. I also discussed with the patient that there may be a patient responsible charge related to this service. The patient expressed understanding and agreed to proceed.   History of Present Illness:   saw wound care clinic/provider today. She reports her wound is cleaning - started on decolonization with chg bath, mupirocin and doxycycline which is also helping with her abdominal wound  Observations/Objective:  Afebrile, fluent speech Reviewed prior records and media pic Assessment and Plan: MRSA soft skin infection -- continue with local wound care ,and completing doxycycline; also finishing decolonization process  Follow Up Instructions: continue to follow up at wound care if needed, and can also come back to ID clinic as needed    I discussed the assessment and treatment plan with the patient. The patient was provided an opportunity to ask questions and all were answered. The patient agreed with the plan and demonstrated an understanding of the instructions.   The patient was advised to call back or seek an in-person evaluation if the symptoms worsen or if the condition fails to improve as anticipated.  I provided  20 minutes of non-face-to-face time during this encounter.   Carlyle Basques, MD

## 2021-08-02 ENCOUNTER — Ambulatory Visit: Payer: Self-pay | Admitting: *Deleted

## 2021-08-02 VITALS — BP 142/86 | Wt 184.4 lb

## 2021-08-02 DIAGNOSIS — R2231 Localized swelling, mass and lump, right upper limb: Secondary | ICD-10-CM

## 2021-08-02 DIAGNOSIS — R2232 Localized swelling, mass and lump, left upper limb: Secondary | ICD-10-CM

## 2021-08-02 DIAGNOSIS — N6312 Unspecified lump in the right breast, upper inner quadrant: Secondary | ICD-10-CM

## 2021-08-02 DIAGNOSIS — Z1239 Encounter for other screening for malignant neoplasm of breast: Secondary | ICD-10-CM

## 2021-08-02 NOTE — Progress Notes (Signed)
Melissa, Shepherd ([id number]) Visit Report for 08/01/2021 Abuse Risk Screen Details Patient Name: Date of Service: Melissa WELL, [name] 08/01/2021 8:30 A M Medical Record Number: [id number] Patient Account Number: [id number] Date of Birth/Sex: Treating RN: 08/07/1970 (51 y.o. Melissa Shepherd, Melissa Shepherd Primary Care Melissa Shepherd: Melissa Shepherd Other Clinician: Referring Melissa Shepherd: Treating Melissa Shepherd/Extender: Melissa Shepherd, Melissa Shepherd: 0 Abuse Risk Screen Items Answer ABUSE RISK SCREEN: Has anyone close to you tried to hurt or harm you recentlyo No Do you feel uncomfortable with anyone in your familyo No Has anyone forced you do things that you didnt want to doo No Electronic Signature(s) Signed: 08/02/2021 4:55:17 PM By: Rhae Hammock RN Entered By: Rhae Hammock on 08/01/2021 09:18:41 -------------------------------------------------------------------------------- Activities of Daily Living Details Patient Name: Date of Service: Melissa WELL, [name] 08/01/2021 8:30 A M Medical Record Number: [id number] Patient Account Number: [id number] Date of Birth/Sex: Treating RN: May 22, 1970 (51 y.o. Melissa Shepherd, Melissa Shepherd Primary Care Tyyonna Soucy: Melissa Shepherd Other Clinician: Referring Markel Mergenthaler: Treating Rickell Wiehe/Extender: Melissa Shepherd, Melissa Shepherd: 0 Activities of Daily Living Items Answer Activities of Daily Living (Please select one for each item) Drive Automobile Completely Able T Medications ake Completely Able Use T elephone Completely Able Care for Appearance Completely Able Use T oilet Completely Able Bath / Shower Completely Able Dress Self Completely Able Feed Self Completely Able Walk Completely Able Get In / Out Bed Completely Able Housework Completely Able Prepare Meals Completely [place] for Self Completely Able Electronic Signature(s) Signed: 08/02/2021 4:55:17 PM By: Rhae Hammock RN Entered By:  Rhae Hammock on 08/01/2021 09:19:06 -------------------------------------------------------------------------------- Education Screening Details Patient Name: Date of Service: Melissa WELL, [name] 08/01/2021 8:30 A M Medical Record Number: [id number] Patient Account Number: [id number] Date of Birth/Sex: Treating RN: April 12, 1970 (51 y.o. Melissa Shepherd, Melissa Shepherd Primary Care Yeiden Frenkel: Melissa Shepherd Other Clinician: Referring [place]: Treating Lillyauna Jenkinson/Extender: Jerrol Banana in Shepherd: 0 Primary Learner Assessed: Patient Learning Preferences/Education Level/Primary Language Learning Preference: Explanation, Demonstration, Communication Board, Printed Material Highest Education Level: College or Above Preferred Language: English Cognitive Barrier Language Barrier: No Translator Needed: No Memory Deficit: No Emotional Barrier: No Cultural/Religious Beliefs Affecting Medical Care: No Physical Barrier Impaired Vision: Yes Glasses Impaired Hearing: No Decreased Hand dexterity: No Knowledge/Comprehension Knowledge Level: High Comprehension Level: High Ability to understand written instructions: High Ability to understand verbal instructions: High Motivation Anxiety Level: Calm Cooperation: Cooperative Education Importance: Denies Need Interest in Health Problems: Asks Questions Perception: Coherent Willingness to Engage in Self-Management High Activities: Readiness to Engage in Self-Management High Activities: Electronic Signature(s) Signed: 08/02/2021 4:55:17 PM By: Rhae Hammock RN Entered By: Rhae Hammock on 08/01/2021 09:19:34 -------------------------------------------------------------------------------- Fall Risk Assessment Details Patient Name: Date of Service: Melissa WELL, [name] 08/01/2021 8:30 A M Medical Record Number: [id number] Patient Account Number: [id number] Date of Birth/Sex: Treating RN: 1970/08/10 (52 y.o. Melissa Shepherd,  Melissa Shepherd Primary Care Demontrez Rindfleisch: Melissa Shepherd Other Clinician: Referring Nickolus Wadding: Treating Nyana Haren/Extender: Melissa Shepherd, Melissa Shepherd: 0 Fall Risk Assessment Items Have you had 2 or more falls in the last 12 monthso 0 No Have you had any fall that resulted in injury in the last 12 monthso 0 No FALLS RISK SCREEN History of falling - immediate or within 3 months 0 No Secondary diagnosis (Do you have 2 or more medical diagnoseso) 0 No Ambulatory aid None/bed rest/wheelchair/nurse 0 No Crutches/cane/walker 0 No Furniture 0 No Intravenous therapy Access/Saline/Heparin Lock 0 No Gait/Transferring Normal/ bed rest/ wheelchair 0  No Weak (short steps with or without shuffle, stooped but able to lift head while walking, may seek 0 No support from furniture) Impaired (short steps with shuffle, may have difficulty arising from chair, head down, impaired 0 No balance) Mental Status Oriented to own ability 0 No Electronic Signature(s) Signed: 08/02/2021 4:55:17 PM By: Rhae Hammock RN Entered By: Rhae Hammock on 08/01/2021 09:19:41 -------------------------------------------------------------------------------- Foot Assessment Details Patient Name: Date of Service: Melissa WELL, [name] 08/01/2021 8:30 A M Medical Record Number: [id number] Patient Account Number: [id number] Date of Birth/Sex: Treating RN: 08/28/1970 (51 y.o. Melissa Shepherd, Melissa Shepherd Primary Care Aima Mcwhirt: Melissa Shepherd Other Clinician: Referring Lauriel Helin: Treating Dajana Gehrig/Extender: Melissa Shepherd, Melissa Shepherd: 0 Foot Assessment Items Site Locations + = Sensation present, - = Sensation absent, C = Callus, U = Ulcer R = Redness, W = Warmth, M = Maceration, PU = Pre-ulcerative lesion F = Fissure, S = Swelling, D = Dryness Assessment Right: Left: Other Deformity: No No Prior Foot Ulcer: No No Prior Amputation: No No Charcot Joint: No No Ambulatory  Status: Gait: Notes N/A and PRT NOT DIABETIC . Electronic Signature(s) Signed: 08/02/2021 4:55:17 PM By: Rhae Hammock RN Entered By: Rhae Hammock on 08/01/2021 09:20:32 -------------------------------------------------------------------------------- Nutrition Risk Screening Details Patient Name: Date of Service: Melissa WELL, [name] 08/01/2021 8:30 A M Medical Record Number: [id number] Patient Account Number: [id number] Date of Birth/Sex: Treating RN: 08-05-1970 (51 y.o. Melissa Shepherd, Melissa Shepherd Primary Care Vadhir Mcnay: Melissa Shepherd Other Clinician: Referring Jaquilla Woodroof: Treating Shrika Milos/Extender: Melissa Shepherd, Melissa Shepherd: 0 Height (in): 66 Weight (lbs): 180 Body Mass Index (BMI): 29 Nutrition Risk Screening Items Score Screening NUTRITION RISK SCREEN: I have an illness or condition that made me change the kind and/or amount of food I eat 0 No I eat fewer than two meals per day 0 No I eat few fruits and vegetables, or milk products 0 No I have three or more drinks of beer, liquor or wine almost every day 0 No I have tooth or mouth problems that make it hard for me to eat 0 No I don't always have enough money to buy the food I need 0 No I eat alone most of the time 0 No I take three or more different prescribed or over-the-counter drugs a day 0 No Without wanting to, I have lost or gained 10 pounds in the last six months 0 No I am not always physically able to shop, cook and/or feed myself 0 No Nutrition Protocols Good Risk Protocol 0 No interventions needed Moderate Risk Protocol High Risk Proctocol Risk Level: Good Risk Score: 0 Electronic Signature(s) Signed: 08/02/2021 4:55:17 PM By: Rhae Hammock RN Entered By: Rhae Hammock on 08/01/2021 09:20:19

## 2021-08-02 NOTE — Progress Notes (Signed)
Melissa Shepherd, Melissa Shepherd ([id number]) Visit Report for 08/01/2021 Chief Complaint Document Details Patient Name: Date of Service: Melissa, BOORMAN Shepherd. 08/01/2021 8:30 A M Medical Record Number: [id number] Patient Account Number: [id number] Date of Birth/Sex: Treating RN: Dec 25, 1970 (51 y.o. Tonita Phoenix, Lauren Primary Care Provider: Threasa Alpha Other Clinician: Referring Provider: Treating Provider/Extender: Westley Foots, CHERYL Weeks in Treatment: 0 Information Obtained from: Patient Chief Complaint 6/7; patient comes into clinic today for review of a wound on her right lower abdominal wall Electronic Signature(s) Signed: 08/01/2021 5:09:19 PM By: Linton Ham MD Entered By: Linton Ham on 08/01/2021 09:52:28 -------------------------------------------------------------------------------- HPI Details Patient Name: Date of Service: Melissa WELL, Brookley Shepherd. 08/01/2021 8:30 A M Medical Record Number: [id number] Patient Account Number: [id number] Date of Birth/Sex: Treating RN: 1970-12-22 (51 y.o. Tonita Phoenix, Lauren Primary Care Provider: Threasa Alpha Other Clinician: Referring Provider: Treating Provider/Extender: Westley Foots, Malachy Mood Weeks in Treatment: 0 History of Present Illness HPI Description: ADMISSION 08/01/2021 This is a 51 year old woman who has a history of MRSA skin abscesses. This includes her left axilla bilateral groin. She states her problem began in January of this year with wounds and abscesses on her right lower extremity. I did not research this in detail however she developed an abscess of her abdominal wall which rapidly spread. She was seen in the ER on 05/31/2021 with an abdominal wall abscess she was given vancomycin in the ER and discharged on trimethoprim sulfamethoxazole. She required admission to hospital from 4/24 through 06/22/2021 for abdominal wall cellulitis and an abscess. She had a CT scan of the abdomen while an inpatient that showed  cellulitis but no drainable fluid collection. 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 Since discharge she has been followed by Dr. Baxter Flattery. She has had a prolonged course of doxycycline and did have Bactroban topically to the wound but most recently has been using Vaseline gauze and more recently than that just simply a dry dressing. I believe she is using Hibiclens showers. The wound on her abdomen actually is very tiny and superficial. There is no measurable depth. Past medical history includes left breast CA, hyperlipidemia, leg swelling secondary to lymphedema. Electronic Signature(s) Signed: 08/01/2021 5:09:19 PM By: Linton Ham MD Entered By: Linton Ham on 08/01/2021 09:57:08 -------------------------------------------------------------------------------- Physical Exam Details Patient Name: Date of Service: Melissa WELL, Kenedie Shepherd. 08/01/2021 8:30 A M Medical Record Number: [id number] Patient Account Number: [id number] Date of Birth/Sex: Treating RN: Jul 12, 1970 (51 y.o. Benjaman Lobe Primary Care Provider: Threasa Alpha Other Clinician: Referring Provider: Treating Provider/Extender: Westley Foots, CHERYL Weeks in Treatment: 0 Constitutional Patient is hypertensive.. Pulse regular and within target range for patient.Marland Kitchen Respirations regular, non-labored and within target range.. Temperature is normal and within the target range for the patient.Marland Kitchen Appears in no distress. Gastrointestinal (GI) Abdomen is soft and non-distended without masses or tenderness.. No liver or spleen enlargement. Integumentary (Hair, Skin) Scars in her left axilla but nothing that looks pathognomonic of hidradenitis. Notes Wound exam; the area in question on the lower abdomen on the left is actually almost completely healed. This is superficial there is no surrounding tenderness no  purulence no draining Electronic Signature(s) Signed: 08/01/2021 5:09:19 PM By: Linton Ham MD Entered By: Linton Ham on 08/01/2021 09:58:45 -------------------------------------------------------------------------------- Physician Orders Details Patient Name: Date of Service: Melissa WELL, Anala Shepherd. 08/01/2021 8:30 A M Medical Record Number: [id number] Patient Account Number:  [id number] Date of Birth/Sex: Treating RN: 05-25-1970 (51 y.o. Tonita Phoenix, Lauren Primary Care Provider: Threasa Alpha Other Clinician: Referring Provider: Treating Provider/Extender: Westley Foots, CHERYL Weeks in Treatment: 0 Verbal / Phone Orders: No Diagnosis Coding ICD-10 Coding Code Description L02.211 Cutaneous abscess of abdominal wall B95.62 Methicillin resistant Staphylococcus aureus infection as the cause of diseases classified elsewhere Discharge From [facility] Discharge from [place] Signature(s) Signed: 08/01/2021 5:09:19 PM By: Linton Ham MD Signed: 08/02/2021 4:55:17 PM By: Rhae Hammock RN Entered By: Rhae Hammock on 08/01/2021 11:24:50 -------------------------------------------------------------------------------- Problem List Details Patient Name: Date of Service: Melissa WELL, Elsie Shepherd. 08/01/2021 8:30 A M Medical Record Number: [id number] Patient Account Number: [id number] Date of Birth/Sex: Treating RN: [birth year] (51 y.o. Tonita Phoenix, Lauren Primary Care Provider: Threasa Alpha Other Clinician: Referring Provider: Treating Provider/Extender: Westley Foots, CHERYL Weeks in Treatment: 0 Active Problems ICD-10 Encounter Code Description Active Date MDM Diagnosis L02.211 Cutaneous abscess of abdominal wall 08/01/2021 No Yes B95.62 Methicillin resistant Staphylococcus aureus infection as the cause of diseases 08/01/2021 No Yes classified elsewhere Inactive Problems Resolved Problems Electronic Signature(s) Signed: 08/01/2021 5:09:19 PM  By: Linton Ham MD Entered By: Linton Ham on 08/01/2021 09:52:01 -------------------------------------------------------------------------------- Progress Note Details Patient Name: Date of Service: Melissa WELL, Anina Shepherd. 08/01/2021 8:30 A M Medical Record Number: [id number] Patient Account Number: [id number] Date of Birth/Sex: Treating RN: 1970/08/13 (51 y.o. Tonita Phoenix, Lauren Primary Care Provider: Threasa Alpha Other Clinician: Referring Provider: Treating Provider/Extender: Westley Foots, CHERYL Weeks in Treatment: 0 Subjective Chief Complaint Information obtained from Patient 6/7; patient comes into clinic today for review of a wound on her right lower abdominal wall History of Present Illness (HPI) ADMISSION 08/01/2021 This is a 51 year old woman who has a history of MRSA skin abscesses. This includes her left axilla bilateral groin. She states her problem began in January of this year with wounds and abscesses on her right lower extremity. I did not research this in detail however she developed an abscess of her abdominal wall which rapidly spread. She was seen in the ER on 05/31/2021 with an abdominal wall abscess she was given vancomycin in the ER and discharged on trimethoprim sulfamethoxazole. She required admission to hospital from 4/24 through 06/22/2021 for abdominal wall cellulitis and an abscess. She had a CT scan of the abdomen while an inpatient that showed cellulitis but no drainable fluid collection. 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 Since discharge she has been followed by Dr. Baxter Flattery. She has had a prolonged course of doxycycline and did have Bactroban topically to the wound but most recently has been using Vaseline gauze and more recently than that just simply a dry dressing. I believe she is using Hibiclens showers. The wound on  her abdomen actually is very tiny and superficial. There is no measurable depth. Past medical history includes left breast CA, hyperlipidemia, leg swelling secondary to lymphedema. Patient History Information obtained from Patient, Chart. Allergies prednisone, morphine, ezetimibe Family History Unknown History. Social History Current every day smoker - cigarettes, Marital Status - Married, Alcohol Use - Rarely, Drug Use - No History, Caffeine Use - Moderate. Medical History Hematologic/Lymphatic Patient has history of Lymphedema Cardiovascular Patient has history of Hypertension Denies history of Angina, Arrhythmia, Congestive Heart Failure, Coronary Artery Disease, Deep Vein Thrombosis, Hypotension, Myocardial Infarction, Peripheral Arterial Disease, Peripheral Venous Disease, Phlebitis, Vasculitis Gastrointestinal Patient has  history of Colitis Denies history of Cirrhosis , Crohnoos, Hepatitis A, Hepatitis B, Hepatitis C Integumentary (Skin) Denies history of History of Burn Musculoskeletal Denies history of Gout, Rheumatoid Arthritis, Osteoarthritis, Osteomyelitis Neurologic Denies history of Dementia, Neuropathy, Quadriplegia, Paraplegia, Seizure Disorder Oncologic Denies history of Received Chemotherapy, Received Radiation Psychiatric Denies history of Anorexia/bulimia, Confinement Anxiety Medical A Surgical History Notes nd Cardiovascular hyperlipidemia Gastrointestinal IBS Musculoskeletal arthritis back, knee, legs, ankles Neurologic back pain chronic Oncologic hx breast ca; right mastectomy Psychiatric depression and anxiety!!! Review of Systems (ROS) Constitutional Symptoms (General Health) Denies complaints or symptoms of Fatigue, Fever, Chills, Marked Weight Change. Eyes Complains or has symptoms of Glasses / Contacts. Ear/Nose/Mouth/Throat Denies complaints or symptoms of Chronic sinus problems or rhinitis. Respiratory Denies complaints or symptoms of  Chronic or frequent coughs, Shortness of Breath. Cardiovascular Denies complaints or symptoms of Chest pain. Gastrointestinal Denies complaints or symptoms of Frequent diarrhea, Nausea, Vomiting. Endocrine Denies complaints or symptoms of Heat/cold intolerance. Genitourinary Denies complaints or symptoms of Frequent urination. Integumentary (Skin) Complains or has symptoms of Wounds. Musculoskeletal Denies complaints or symptoms of Muscle Pain, Muscle Weakness. Neurologic Denies complaints or symptoms of Numbness/parasthesias. Psychiatric Denies complaints or symptoms of Claustrophobia, Suicidal. Objective Constitutional Patient is hypertensive.. Pulse regular and within target range for patient.Marland Kitchen Respirations regular, non-labored and within target range.. Temperature is normal and within the target range for the patient.Marland Kitchen Appears in no distress. Vitals Time Taken: 9:00 AM, Height: 66 in, Source: Stated, Weight: 180 lbs, Source: Stated, BMI: 29, Temperature: 98.7 F, Pulse: 74 bpm, Respiratory Rate: 17 breaths/min, Blood Pressure: 178/112 mmHg. Gastrointestinal (GI) Abdomen is soft and non-distended without masses or tenderness.. No liver or spleen enlargement. General Notes: Wound exam; the area in question on the lower abdomen on the left is actually almost completely healed. This is superficial there is no surrounding tenderness no purulence no draining Integumentary (Hair, Skin) Scars in her left axilla but nothing that looks pathognomonic of hidradenitis. Assessment Active Problems ICD-10 Cutaneous abscess of abdominal wall Methicillin resistant Staphylococcus aureus infection as the cause of diseases classified elsewhere Plan 1. The patient's wound is just about healed. I recommended topical Bactroban to continue daily with a covering Band-Aid. 2. The patient seems well versed in care and dealing with MRSA type wounds. She is using I think Hibiclens which seems reasonable  to continue. 3. There is seems little reason for her to continue to follow here her wound is essentially healed Electronic Signature(s) Signed: 08/01/2021 5:09:19 PM By: Linton Ham MD Entered By: Linton Ham on 08/01/2021 10:00:40 -------------------------------------------------------------------------------- HxROS Details Patient Name: Date of Service: Melissa WELL, Alaylah Shepherd. 08/01/2021 8:30 A M Medical Record Number: [id number] Patient Account Number: [id number] Date of Birth/Sex: Treating RN: [birth year] (51 y.o. Tonita Phoenix, Lauren Primary Care Provider: Threasa Alpha Other Clinician: Referring Provider: Treating Provider/Extender: Westley Foots, CHERYL Weeks in Treatment: 0 Information Obtained From Patient Chart Constitutional Symptoms (General Health) Complaints and Symptoms: Negative for: Fatigue; Fever; Chills; Marked Weight Change Eyes Complaints and Symptoms: Positive for: Glasses / Contacts Ear/Nose/Mouth/Throat Complaints and Symptoms: Negative for: Chronic sinus problems or rhinitis Respiratory Complaints and Symptoms: Negative for: Chronic or frequent coughs; Shortness of Breath Cardiovascular Complaints and Symptoms: Negative for: Chest pain Medical History: Positive for: Hypertension Negative for: Angina; Arrhythmia; Congestive Heart Failure; Coronary Artery Disease; Deep Vein Thrombosis; Hypotension; Myocardial Infarction; Peripheral Arterial Disease; Peripheral Venous Disease; Phlebitis; Vasculitis Past Medical History Notes: hyperlipidemia Gastrointestinal Complaints and Symptoms: Negative for: Frequent diarrhea; Nausea; Vomiting Medical History: Positive  for: Colitis Negative for: Cirrhosis ; Crohns; Hepatitis A; Hepatitis B; Hepatitis C Past Medical History Notes: IBS Endocrine Complaints and Symptoms: Negative for: Heat/cold intolerance Genitourinary Complaints and Symptoms: Negative for: Frequent urination Integumentary  (Skin) Complaints and Symptoms: Positive for: Wounds Medical History: Negative for: History of Burn Musculoskeletal Complaints and Symptoms: Negative for: Muscle Pain; Muscle Weakness Medical History: Negative for: Gout; Rheumatoid Arthritis; Osteoarthritis; Osteomyelitis Past Medical History Notes: arthritis back, knee, legs, ankles Neurologic Complaints and Symptoms: Negative for: Numbness/parasthesias Medical History: Negative for: Dementia; Neuropathy; Quadriplegia; Paraplegia; Seizure Disorder Past Medical History Notes: back pain chronic Psychiatric Complaints and Symptoms: Negative for: Claustrophobia; Suicidal Medical History: Negative for: Anorexia/bulimia; Confinement Anxiety Past Medical History Notes: depression and anxiety!!! Hematologic/Lymphatic Medical History: Positive for: Lymphedema Immunological Oncologic Medical History: Negative for: Received Chemotherapy; Received Radiation Past Medical History Notes: hx breast ca; right mastectomy Immunizations Pneumococcal Vaccine: Received Pneumococcal Vaccination: No Implantable Devices None Family and Social History Unknown History: Yes; Current every day smoker - cigarettes; Marital Status - Married; Alcohol Use: Rarely; Drug Use: No History; Caffeine Use: Moderate; Financial Concerns: No; Food, Clothing or Shelter Needs: No; Support System Lacking: No; Transportation Concerns: No Engineer, maintenance) Signed: 08/01/2021 5:09:19 PM By: Linton Ham MD Signed: 08/02/2021 4:55:17 PM By: Rhae Hammock RN Entered By: Rhae Hammock on 08/01/2021 09:18:34 -------------------------------------------------------------------------------- SuperBill Details Patient Name: Date of Service: Melissa WELL, Bentli Shepherd. 08/01/2021 Medical Record Number: [id number] Patient Account Number: [id number] Date of Birth/Sex: Treating RN: November 14, 1970 (51 y.o. Tonita Phoenix, Lauren Primary Care Provider: Threasa Alpha Other  Clinician: Referring Provider: Treating Provider/Extender: Westley Foots, CHERYL Weeks in Treatment: 0 Diagnosis Coding ICD-10 Codes Code Description L02.211 Cutaneous abscess of abdominal wall B95.62 Methicillin resistant Staphylococcus aureus infection as the cause of diseases classified elsewhere Facility Procedures CPT4 Code: 60109323 Description: 99213 - WOUND CARE VISIT-LEV 3 EST PT Modifier: Quantity: 1 Physician Procedures : CPT4 Code Description Modifier [id number] - WC PHYS LEVEL 2 - NEW PT ICD-10 Diagnosis Description L02.211 Cutaneous abscess of abdominal wall B95.62 Methicillin resistant Staphylococcus aureus infection as the cause of diseases classified elsewhere Quantity: 1 Electronic Signature(s) Signed: 08/01/2021 5:09:19 PM By: Linton Ham MD Signed: 08/02/2021 4:55:17 PM By: Rhae Hammock RN Entered By: Rhae Hammock on 08/01/2021 11:26:17

## 2021-08-02 NOTE — Progress Notes (Signed)
[name] is a 51 y.o. 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    Pap Smear: Pap smear not completed today. Last Pap smear was 10/19/2020 at [place] [facility] and was normal with negative HPV. Per patient has history of an abnormal Pap smear. Per patient has a history of two abnormal Pap smears 10 years and 17 years ago that colposcopies were completed for follow up. Patient's previous Pap smear 10/07/2019 was normal with positive HPV that a repeat Pap smear in one year was completed for follow up that was her most recent Pap smear. Last Pap smear result is available in Epic.   Physical exam: Breasts Left breast larger than right breast due to history of right breast mastectomy for breast cancer in December 2021. No skin abnormalities bilateral breasts. No nipple retraction left breast. No nipple discharge left breast. Palpated a lump within the right axilla at 10 o'clock 18 cm from the center of the mastectomy scar. Palpated four left axillary lumps on exam at 1 o'clock 14 cm from the nipple, 1 o'clock 12 cm from the nipple, 2 o'clock 15 cm from the nipple, and 2:30 o'clock 17 cm from the nipple. Palpated two lumps within the right breast at 2 o'clock 6 cm and 2 o'clock 5 cm from mastectomy scar. Palpated a firm area at the center of mastectomy scar versus lump at the center of the breast. No lumps palpated left breast. No complaints of pain or tenderness on exam.  MS DIGITAL DIAG TOMO UNI LEFT  Result Date: 05/17/2021 CLINICAL DATA:  Personal history of malignant RIGHT mastectomy in 2021, presenting now with a possible palpable lump in the LEFT axilla and a possible palpable lump at the mastectomy site. EXAM: DIGITAL DIAGNOSTIC UNILATERAL LEFT MAMMOGRAM WITH  TOMOSYNTHESIS AND CAD; ULTRASOUND RIGHT BREAST LIMITED; [place] AXILLARY LEFT TECHNIQUE: Left digital diagnostic mammography and breast tomosynthesis was performed. The images were evaluated with computer-aided detection.; Targeted ultrasound examination of the right breast was performed; Targeted ultrasound examination of the left axilla was performed. COMPARISON:  Previous exam(s). ACR Breast Density Category c: The breast tissue is heterogeneously dense, which may obscure small masses. FINDINGS: Full field CC and MLO views of the LEFT breast and a spot tangential view of the palpable concern in the LEFT axilla were obtained. No findings suspicious for malignancy. No mammographic abnormality in the axilla at the site of palpable concern. Targeted ultrasound is performed in the area of palpable concern in the LEFT axilla, demonstrating normal fibrofatty tissue. There is no mass or pathologic lymphadenopathy. Targeted ultrasound is performed in the area of palpable concern at the RIGHT mastectomy site, demonstrating a superficial oval circumscribed parallel anechoic mass with a thin internal septation superior to the mastectomy scar adjacent to the sternum measuring approximately 8 x 5 x 6 mm, demonstrating posterior acoustic enhancement and no internal power Doppler flow. On correlative physical examination, there is a pea-sized palpable superficial lump in the RIGHT mastectomy bed above the scar adjacent to the sternum. IMPRESSION: 1. No mammographic evidence of malignancy involving the LEFT breast. 2. No pathologic LEFT axillary lymphadenopathy. 3. Likely benign 8 mm oil cyst involving the superficial tissues of the RIGHT mastectomy bed superior to the scar and adjacent to the sternum which accounts  for the palpable concern. RECOMMENDATION: 1. Ultrasound of the RIGHT mastectomy bed in 6 months. 2. LEFT mammography in 1 year. I have discussed the findings and recommendations with the patient. If applicable, a reminder  letter will be sent to the patient regarding the next appointment. BI-RADS CATEGORY  3: Probably benign. Electronically Signed   By: Evangeline Dakin M.D.   On: 05/17/2021 16:33       Pelvic/Bimanual Pap is not indicated today per BCCCP guidelines.   Smoking History: Patient is a current smoker. Discussed smoking cessation with patient. Referred to the [facility] Quitline.   Patient Navigation: Patient education provided. Access to services provided for patient through [employer] program.   Colorectal Cancer Screening: Per patient had a colonoscopy completed 2 years ago. FIT Test given to patient to complete on previous BCCCP visit 05/17/2021. No complaints today.    Breast and Cervical Cancer Risk Assessment: Patient has family history of her maternal grandmother and a paternal aunt having breast cancer. Patient has a personal history of breast cancer. Patient has no known genetic mutations or history of radiation treatment to the chest before age 64. Per patient has history of cervical dysplasia. Patient has no history of being immunocompromised or DES exposure in-utero.  Risk Assessment   No risk assessment data for the current encounter  Risk Scores       05/17/2021   Last edited by: Royston Bake, CMA   5-year risk: 0.8 %   Lifetime risk: 7.8 %            A: BCCCP exam without pap smear Complaint of right breast and left axillary lumps.  P: Referred patient to the [facility] for a right breast ultrasound Appointment scheduled Tuesday, August 07, 2021 at 0950.  Loletta Parish, RN 08/02/2021 2:35 PM

## 2021-08-02 NOTE — Progress Notes (Signed)
SAKIRA, DAHMER ([id number]) Visit Report for 08/01/2021 Allergy List Details Patient Name: Date of Service: [name], Darlean E. 08/01/2021 8:30 A M Medical Record Number: [id number] Patient Account Number: [id number] Date of Birth/Sex: Treating RN: Jun 08, 1970 (51 y.o. Tonita Phoenix, Lauren Primary Care Jameah Rouser: Threasa Alpha Other Clinician: Referring Sandrine Bloodsworth: Treating Wilena Tyndall/Extender: Westley Foots, CHERYL Weeks in Treatment: 0 Allergies Active Allergies prednisone morphine ezetimibe Allergy Notes Electronic Signature(s) Signed: 08/02/2021 4:55:17 PM By: Rhae Hammock RN Entered By: Rhae Hammock on 08/01/2021 09:09:45 -------------------------------------------------------------------------------- Arrival Information Details Patient Name: Date of Service: [name], Aniayah E. 08/01/2021 8:30 A M Medical Record Number: [id number] Patient Account Number: [id number] Date of Birth/Sex: Treating RN: [birth year] (51 y.o. Tonita Phoenix, Lauren Primary Care Maille Halliwell: Threasa Alpha Other Clinician: Referring Aadhya Bustamante: Treating Eliza Green/Extender: Jerrol Banana in Treatment: 0 Visit Information Patient Arrived: Ambulatory Arrival Time: 09:01 Accompanied By: self Transfer Assistance: None Patient Identification Verified: Yes Secondary Verification Process Completed: Yes Patient Requires Transmission-Based Precautions: No Patient Has Alerts: No Electronic Signature(s) Signed: 08/02/2021 4:55:17 PM By: Rhae Hammock RN Entered By: Rhae Hammock on 08/01/2021 09:01:50 -------------------------------------------------------------------------------- Clinic Level of Care Assessment Details Patient Name: Date of Service: [name], Jontavia E. 08/01/2021 8:30 A M Medical Record Number: [id number] Patient Account Number: [id number] Date of Birth/Sex: Treating RN: [birth year] (51 y.o. Tonita Phoenix, Lauren Primary Care Timothea Bodenheimer: Threasa Alpha Other  Clinician: Referring Keyasia Jolliff: Treating Sostenes Kauffmann/Extender: Westley Foots, CHERYL Weeks in Treatment: 0 Clinic Level of Care Assessment Items TOOL 4 Quantity Score X- 1 0 Use when only an EandM is performed on FOLLOW-UP visit ASSESSMENTS - Nursing Assessment / Reassessment X- 1 10 Reassessment of Co-morbidities (includes updates in patient status) X- 1 5 Reassessment of Adherence to Treatment Plan ASSESSMENTS - Wound and Skin A ssessment / Reassessment '[]'$  - 0 Simple Wound Assessment / Reassessment - one wound '[]'$  - 0 Complex Wound Assessment / Reassessment - multiple wounds '[]'$  - 0 Dermatologic / Skin Assessment (not related to wound area) ASSESSMENTS - Focused Assessment '[]'$  - 0 Circumferential Edema Measurements - multi extremities '[]'$  - 0 Nutritional Assessment / Counseling / Intervention '[]'$  - 0 Lower Extremity Assessment (monofilament, tuning fork, pulses) '[]'$  - 0 Peripheral Arterial Disease Assessment (using hand held doppler) ASSESSMENTS - Ostomy and/or Continence Assessment and Care '[]'$  - 0 Incontinence Assessment and Management '[]'$  - 0 Ostomy Care Assessment and Management (repouching, etc.) PROCESS - Coordination of Care X - Simple Patient / Family Education for ongoing care 1 15 '[]'$  - 0 Complex (extensive) Patient / Family Education for ongoing care X- 1 10 Staff obtains Programmer, systems, Records, T Results / Process Orders est '[]'$  - 0 Staff telephones HHA, Nursing Homes / Clarify orders / etc '[]'$  - 0 Routine Transfer to another Facility (non-emergent condition) '[]'$  - 0 Routine Hospital Admission (non-emergent condition) X- 1 15 New Admissions / Biomedical engineer / Ordering NPWT Apligraf, etc. , '[]'$  - 0 Emergency Hospital Admission (emergent condition) X- 1 10 Simple Discharge Coordination '[]'$  - 0 Complex (extensive) Discharge Coordination PROCESS - Special Needs '[]'$  - 0 Pediatric / Minor Patient Management '[]'$  - 0 Isolation Patient Management '[]'$  -  0 Hearing / Language / Visual special needs '[]'$  - 0 Assessment of Community assistance (transportation, D/C planning, etc.) '[]'$  - 0 Additional assistance / Altered mentation '[]'$  - 0 Support Surface(s) Assessment (bed, cushion, seat, etc.) INTERVENTIONS - Wound Cleansing / Measurement X- 1 5 Simple Wound Cleansing - one wound '[]'$  - 0 Complex Wound Cleansing - multiple wounds  X- 1 5 Wound Imaging (photographs - any number of wounds) '[]'$  - 0 Wound Tracing (instead of photographs) X- 1 5 Simple Wound Measurement - one wound '[]'$  - 0 Complex Wound Measurement - multiple wounds INTERVENTIONS - Wound Dressings X - Small Wound Dressing one or multiple wounds 1 10 '[]'$  - 0 Medium Wound Dressing one or multiple wounds '[]'$  - 0 Large Wound Dressing one or multiple wounds '[]'$  - 0 Application of Medications - topical '[]'$  - 0 Application of Medications - injection INTERVENTIONS - Miscellaneous '[]'$  - 0 External ear exam '[]'$  - 0 Specimen Collection (cultures, biopsies, blood, body fluids, etc.) '[]'$  - 0 Specimen(s) / Culture(s) sent or taken to Lab for analysis '[]'$  - 0 Patient Transfer (multiple staff / Civil Service fast streamer / Similar devices) '[]'$  - 0 Simple Staple / Suture removal (25 or less) '[]'$  - 0 Complex Staple / Suture removal (26 or more) '[]'$  - 0 Hypo / Hyperglycemic Management (close monitor of Blood Glucose) '[]'$  - 0 Ankle / Brachial Index (ABI) - do not check if billed separately X- 1 5 Vital Signs Has the patient been seen at the hospital within the last three years: Yes Total Score: 95 Level Of Care: New/Established - Level 3 Electronic Signature(s) Signed: 08/02/2021 4:55:17 PM By: Rhae Hammock RN Entered By: Rhae Hammock on 08/01/2021 11:26:12 -------------------------------------------------------------------------------- Encounter Discharge Information Details Patient Name: Date of Service: [name], Lacrisha E. 08/01/2021 8:30 A M Medical Record Number: [id number] Patient Account  Number: [id number] Date of Birth/Sex: Treating RN: [birth year] (51 y.o. Tonita Phoenix, Lauren Primary Care Meng Winterton: Threasa Alpha Other Clinician: Referring Caitlen Worth: Treating Lummie Montijo/Extender: Jerrol Banana in Treatment: 0 Encounter Discharge Information Items Discharge Condition: Stable Ambulatory Status: Ambulatory Discharge Destination: Home Transportation: Private Auto Accompanied By: sale Schedule Follow-up Appointment: Yes Clinical Summary of Care: Patient Declined Electronic Signature(s) Signed: 08/02/2021 4:55:17 PM By: Rhae Hammock RN Entered By: Rhae Hammock on 08/01/2021 11:26:53 -------------------------------------------------------------------------------- Lower Extremity Assessment Details Patient Name: Date of Service: [name], Jalisha E. 08/01/2021 8:30 A M Medical Record Number: [id number] Patient Account Number: [id number] Date of Birth/Sex: Treating RN: 1970/10/01 (51 y.o. Tonita Phoenix, Lauren Primary Care Avila Albritton: Threasa Alpha Other Clinician: Referring Mychelle Kendra: Treating Kimberely Mccannon/Extender: Westley Foots, CHERYL Weeks in Treatment: 0 Electronic Signature(s) Signed: 08/02/2021 4:55:17 PM By: Rhae Hammock RN Entered By: Rhae Hammock on 08/01/2021 09:20:42 -------------------------------------------------------------------------------- Multi-Disciplinary Care Plan Details Patient Name: Date of Service: [name], Renelle E. 08/01/2021 8:30 A M Medical Record Number: [id number] Patient Account Number: [id number] Date of Birth/Sex: Treating RN: Aug 08, 1970 (51 y.o. Tonita Phoenix, Lauren Primary Care Noele Icenhour: Threasa Alpha Other Clinician: Referring Berwyn Bigley: Treating Lyndell Allaire/Extender: Westley Foots, CHERYL Weeks in Treatment: 0 Active Inactive Electronic Signature(s) Signed: 08/02/2021 4:55:17 PM By: Rhae Hammock RN Entered By: Rhae Hammock on 08/01/2021  11:24:55 -------------------------------------------------------------------------------- Pain Assessment Details Patient Name: Date of Service: [name], Karee E. 08/01/2021 8:30 A M Medical Record Number: [id number] Patient Account Number: [id number] Date of Birth/Sex: Treating RN: 1970-12-07 (51 y.o. Tonita Phoenix, Lauren Primary Care Alenah Sarria: Threasa Alpha Other Clinician: Referring Esabella Stockinger: Treating Nelsie Domino/Extender: Westley Foots, CHERYL Weeks in Treatment: 0 Active Problems Location of Pain Severity and Description of Pain Patient Has Paino Yes Site Locations Pain Location: Pain Location: Generalized Pain, Pain in Ulcers With Dressing Change: Yes Duration of the Pain. Constant / Intermittento Constant Rate the pain. Current Pain Level: 8 Worst Pain Level: 10 Least Pain Level: 0 Tolerable Pain Level: 8 Character of Pain Describe the Pain: Aching Pain  Management and Medication Current Pain Management: Medication: Yes Cold Application: Yes Rest: Yes Massage: Yes Activity: Yes T.E.N.S.: Yes Heat Application: Yes Leg drop or elevation: Yes Is the Current Pain Management Adequate: Adequate How does your wound impact your activities of daily livingo Sleep: No Bathing: No Appetite: No Relationship With Others: No Bladder Continence: No Emotions: No Bowel Continence: No Work: No Toileting: No Drive: No Dressing: No Hobbies: No Electronic Signature(s) Signed: 08/02/2021 4:55:17 PM By: Rhae Hammock RN Entered By: Rhae Hammock on 08/01/2021 09:20:07 -------------------------------------------------------------------------------- Patient/Caregiver Education Details Patient Name: Date of Service: Loleta Chance 6/7/2023andnbsp8:30 A M Medical Record Number: [id number] Patient Account Number: [id number] Date of Birth/Gender: Treating RN: February 02, 1971 (51 y.o. Benjaman Lobe Primary Care Physician: Threasa Alpha Other  Clinician: Referring Physician: Treating Physician/Extender: Jerrol Banana in Treatment: 0 Education Assessment Education Provided To: Patient Education Topics Provided Welcome T [facility] [place]: o Methods: Explain/Verbal Responses: Reinforcements needed, State content correctly Electronic Signature(s) Signed: 08/02/2021 4:55:17 PM By: Rhae Hammock RN Entered By: Rhae Hammock on 08/01/2021 11:24:59 -------------------------------------------------------------------------------- Vitals Details Patient Name: Date of Service: [name], Tene E. 08/01/2021 8:30 A M Medical Record Number: [id number] Patient Account Number: [id number] Date of Birth/Sex: Treating RN: Jun 22, 1970 (51 y.o. Tonita Phoenix, Lauren Primary Care Pratyush Ammon: Threasa Alpha Other Clinician: Referring Ariauna Farabee: Treating Devota Viruet/Extender: Westley Foots, CHERYL Weeks in Treatment: 0 Vital Signs Time Taken: 09:00 Temperature (F): 98.7 Height (in): 66 Pulse (bpm): 74 Source: Stated Respiratory Rate (breaths/min): 17 Weight (lbs): 180 Blood Pressure (mmHg): 178/112 Source: Stated Reference Range: 80 - 120 mg / dl Body Mass Index (BMI): 29 Electronic Signature(s) Signed: 08/02/2021 4:55:17 PM By: Rhae Hammock RN Entered By: Rhae Hammock on 08/01/2021 09:06:53

## 2021-08-02 NOTE — Patient Instructions (Addendum)
Explained breast self awareness with Melissa Shepherd. Patient did not need a Pap smear today due to last Pap smear and HPV typing was 10/19/2020. Per ASCCP guidelines next Pap smear is due in one year and per patient her provider recommended a Pap smear in one year. Referred patient to the [facility] for a right breast ultrasound Appointment scheduled Tuesday, August 07, 2021 at 0950. Patient aware of appointment and will be there. Discussed smoking cessation with patient. Referred to the [facility] Quitline. Melissa Shepherd verbalized understanding.  Melissa Shepherd, [name], RN 2:35 PM

## 2021-08-03 ENCOUNTER — Encounter: Payer: Self-pay | Admitting: Hematology

## 2021-08-07 ENCOUNTER — Other Ambulatory Visit: Payer: 59

## 2021-08-07 ENCOUNTER — Telehealth: Payer: Self-pay

## 2021-08-07 NOTE — Telephone Encounter (Signed)
Patient left a voice message 08-07-21:  Needing to ask about a MRI.  Please advise.    Call back:  [phone]   Thanks, Helene Kelp

## 2021-08-07 NOTE — Telephone Encounter (Signed)
Left vm for patient to return call 

## 2021-08-14 ENCOUNTER — Ambulatory Visit
Admission: RE | Admit: 2021-08-14 | Discharge: 2021-08-14 | Disposition: A | Discharge status: Home / Self Care | Payer: No Typology Code available for payment source | Source: Ambulatory Visit | Attending: Obstetrics and Gynecology | Admitting: Obstetrics and Gynecology

## 2021-08-14 DIAGNOSIS — N6310 Unspecified lump in the right breast, unspecified quadrant: Secondary | ICD-10-CM

## 2021-08-22 ENCOUNTER — Inpatient Hospital Stay: Payer: 59 | Attending: Hematology | Admitting: Hematology and Oncology

## 2021-08-22 ENCOUNTER — Encounter: Payer: Self-pay | Admitting: Physical Medicine and Rehabilitation

## 2021-08-22 ENCOUNTER — Inpatient Hospital Stay: Payer: 59

## 2021-08-22 ENCOUNTER — Encounter: Payer: Self-pay | Admitting: Hematology and Oncology

## 2021-08-22 DIAGNOSIS — C50111 Malignant neoplasm of central portion of right female breast: Secondary | ICD-10-CM | POA: Diagnosis not present

## 2021-08-22 DIAGNOSIS — Z170 Estrogen receptor positive status [ER+]: Secondary | ICD-10-CM | POA: Insufficient documentation

## 2021-08-22 NOTE — Progress Notes (Signed)
[facility] NOTE  Patient Care Team: Remi Haggard, FNP as PCP - General (Family Medicine)  CHIEF COMPLAINTS/PURPOSE OF CONSULTATION:  Right breast cancer  HISTORY OF PRESENTING ILLNESS:  Melissa Shepherd 51 y.o. female is here because of recent diagnosis of right breast cancer  I reviewed her records extensively and collaborated the history with the patient.  SUMMARY OF ONCOLOGIC HISTORY: Oncology History  Malignant neoplasm of central portion of right breast in female, estrogen receptor positive ([place])  01/27/2020 Cancer Staging   Staging form: Breast, AJCC 8th Edition - Pathologic stage from 01/27/2020: Stage IA (pT1a, pN0, cM0, G3, ER+, PR+, HER2-) - Signed by Truitt Merle, MD on 03/18/2021 Stage prefix: Initial diagnosis Multigene prognostic tests performed: None Histologic grading system: 3 grade system Residual tumor (R): R0 - None   03/06/2021 Initial Diagnosis   Malignant neoplasm of central portion of right breast in female, estrogen receptor positive ([facility])    Treatment plan/history: 1. Status post right mastectomy with immediate reconstruction on 01/27/2020. Pathology revealed 3 mm grade 3 invasive ductal carcinoma with associated DCIS. 4 sentinel nodes negative for metastatic disease. Breast prognostic panel showed tumor to be ER +50%, moderate intensity. PR positive, 5%, weak intensity. HER-2/neu negative with Ki-67 10%.  2. Initiation of Lupron monthly March 20, 2020.  3. Initiation of adjuvant anastrozole 1 mg daily, patient discontinued secondary to myalgias and arthralgias.  4. Initiate letrozole 2.5 mg daily, 11/14/2020- 12/19/2020, discontinued secondary to myalgias and arthralgias  5. Initiation of Aromasin 25 mg 12/19/2020    Interval history  She is here for follow up.  She is here for a telephone follow up. She complains of bilateral groin pain, wonders if this could be from the shot. She also reports some other arthralgias in her  knees.  She has been trying to take the Aromasin every day as recommended.  She had mammogram after palpating a small nodule at the right mastectomy site.  This was thought to be probably benign breast mass along the medial scar site favored to represent fat necrosis.  Additional probable complicated cyst along the mastectomy scar site largest measuring up to 17 mm.  Recommendation was to consider repeating ultrasound in September, this was ordered.  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 2003.   2   CHOLECYSTECTOMY N/A 11/26/2016   Procedure: LAPAROSCOPIC CHOLECYSTECTOMY WITH INTRAOPERATIVE CHOLANGIOGRAM;  Surgeon: Fanny Skates, MD;  Location: [facility];  Service: General;  Laterality: N/A;   MASTECTOMY Right    WISDOM TOOTH EXTRACTION      SOCIAL HISTORY: Social History   Socioeconomic History   Marital status: Married    Spouse name: Not on file   Number of children: Not on file   Years of education: Not on file   Highest education level: Not on file  Occupational History  Not on file  Tobacco Use   Smoking status: Every Day    Packs/day: 0.25    Years: 31.00    Total pack years: 7.75    Types: Cigarettes   Smokeless tobacco: Never  Vaping Use   Vaping Use: Never used  Substance and Sexual Activity   Alcohol use: Not Currently    Alcohol/week: 2.0 standard drinks of alcohol    Types: 2 Cans of beer per week   Drug use: No   Sexual activity: Not Currently  Other  Topics Concern   Not on file  Social History Narrative   Hair dresser    Lives with husband and 73 yo daughter   High school degree and cosmetology school    Right handed    Caffeine- 1 cup of coffee, 1 soda    Pets: 1 dog inside    Social Determinants of Health   Financial Resource Strain: Not on file  Food Insecurity: Food Insecurity Present (08/02/2021)   Hunger Vital Sign    Worried About Running Out of Food in the Last Year: Often true    Ran Out of Food in the Last Year: Often true  Transportation Needs: No Transportation Needs (08/02/2021)   PRAPARE - Hydrologist (Medical): No    Lack of Transportation (Non-Medical): No  Physical Activity: Not on file  Stress: Not on file  Social Connections: Not on file  Intimate Partner Violence: Not on file    FAMILY HISTORY: Family History  Problem Relation Age of Onset   Hypertension Mother    Cancer Sister        cervical   Breast cancer Paternal Aunt    Breast cancer Maternal Grandmother    Cancer Maternal Grandmother        Breast    ALLERGIES:  is allergic to ezetimibe, prednisone, and morphine.  MEDICATIONS:  Current Outpatient Medications  Medication Sig Dispense Refill   ALPRAZolam (XANAX) 1 MG tablet Take 1 mg by mouth 3 (three) times daily.     amLODipine (NORVASC) 10 MG tablet Take 10 mg by mouth daily.     amphetamine-dextroamphetamine (ADDERALL) 20 MG tablet Take 20 mg by mouth 2 (two) times daily.     ascorbic acid (VITAMIN C) 250 MG CHEW Chew 500 mg by mouth at bedtime.     aspirin 81 MG EC tablet Take 1 tablet (81 mg total) by mouth daily. Swallow whole. 30 tablet 11   CALCIUM PO Take 600 mg by mouth at bedtime.     Chlorhexidine Gluconate Cloth 2 % PADS Apply 6 each topically daily at 6 (six) AM. Use 1 cloth per appendage. Then use the remaining clothes for your torso. Use daily x 5 days. Regardless of time of day 30 each 0   Cholecalciferol (VITAMIN D-3) 125 MCG (5000 UT) TABS  Take 2 tablets by mouth at bedtime.     colestipol (COLESTID) 1 g tablet Take 1-2 g by mouth 2 (two) times daily.     doxycycline (VIBRA-TABS) 100 MG tablet Take 1 tablet (100 mg total) by mouth 2 (two) times daily. Take on full stomach to minimize nausea 14 tablet 0   exemestane (AROMASIN) 25 MG tablet TAKE 1 TABLET (25 MG TOTAL) BY MOUTH DAILY. 30 tablet 0   folic acid (FOLVITE) 1 MG tablet Take 1 mg by mouth at bedtime.     gabapentin (NEURONTIN) 300 MG capsule Take 300 mg by mouth 3 (three) times daily.  hydrochlorothiazide (HYDRODIURIL) 12.5 MG tablet TAKE 1 TABLET BY MOUTH DAILY. (Patient taking differently: Take 12.5 mg by mouth daily.) 30 tablet 7   Iron-Vitamins (GERITOL PO) Take 1 tablet by mouth daily.     metoprolol succinate (TOPROL-XL) 100 MG 24 hr tablet Take 100 mg by mouth daily.     mupirocin ointment (BACTROBAN) 2 % Apply 1 application. topically 3 (three) times daily. As directed for 5 days 22 g 0   nystatin (MYCOSTATIN) 100000 UNIT/ML suspension Use as directed 5 mLs (500,000 Units total) in the mouth or throat 4 (four) times daily as needed (Oral irritation or Oral thrush). 180 mL 0   oxyCODONE-acetaminophen (PERCOCET/ROXICET) 5-325 MG tablet Take 1 tablet by mouth every 4 (four) hours as needed for moderate pain. 20 tablet 0   potassium chloride SA (KLOR-CON M) 20 MEQ tablet Take 20 mEq by mouth at bedtime.     rosuvastatin (CRESTOR) 40 MG tablet Take 40 mg by mouth at bedtime.     sertraline (ZOLOFT) 100 MG tablet Take 100 mg by mouth at bedtime.     No current facility-administered medications for this visit.    PHYSICAL EXAMINATION: ECOG PERFORMANCE STATUS: 0 - Asymptomatic  Vital signs and physical examination not done, televisit  LABORATORY DATA:  I have reviewed the data as listed Lab Results  Component Value Date   WBC 6.2 06/20/2021   HGB 13.2 06/20/2021   HCT 39.0 06/20/2021   MCV 88.0 06/20/2021   PLT 179 06/20/2021   Lab Results  Component Value  Date   NA 143 06/20/2021   K 3.5 06/20/2021   CL 111 06/20/2021   CO2 27 06/20/2021    RADIOGRAPHIC STUDIES: I have personally reviewed the radiological reports and agreed with the findings in the report.  ASSESSMENT AND PLAN:   Malignant neoplasm of central portion of right breast in female, estrogen receptor positive ([place]) This is a 51 year old premenopausal female patient with right breast invasive ductal carcinoma status post right mastectomy, final pathology showing 8 mm right breast IDC with associated DCIS, 4 sentinel nodes negative, prognostic showed ER 50% positive, PR positive, 5% weak intensity and HER2 negative with Ki-67 of 10%.  She has been receiving adjuvant ovarian suppression with Lupron and Aromasin.   She has tried Arimidex as well as letrozole in the past, could not tolerate because of arthralgias.  She is switching providers because of some change in insurance.   She continues on leuprolide and exemestane as recommended. During her last visit we have found a nodule in the mastectomy bed hence she had an ultrasound which is thought to be benign, repeat ultrasound recommended in September which has been ordered. We have discussed the most recent bone density findings which are normal, no evidence of osteopenia or osteoporosis. She will proceed with the leuprolide every 28 days, currently scheduled for follow-up in August. She was encouraged to contact us with any new questions or concerns.  I connected with  Joycelyn Man on 08/22/21 by telephone application and verified that I am speaking with the correct person using two identifiers.   I discussed the limitations of evaluation and management by telemedicine. The patient expressed understanding and agreed to proceed.   I spent 15 minutes in the care of this patient reviewing history, medical records, physical examination, counseling and coordination of care. All questions were answered. The patient knows to call the  clinic with any problems, questions or concerns.    Benay Pike, MD 08/22/21

## 2021-08-22 NOTE — Assessment & Plan Note (Signed)
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  She has been receiving adjuvant ovarian suppression with Lupron and Aromasin.   She has tried Arimidex as well as letrozole in the past, could not tolerate because of arthralgias.  She is switching providers because of some change in insurance.   She continues on leuprolide and exemestane as recommended. During her last visit we have found a nodule in the mastectomy bed hence she had an ultrasound which is thought to be benign, repeat ultrasound recommended in September which has been ordered. We have discussed the most recent bone density findings which are normal, no evidence of osteopenia or osteoporosis. She will proceed with the leuprolide every 28 days, currently scheduled for follow-up in August. She was encouraged to contact us with any new questions or concerns.

## 2021-08-24 ENCOUNTER — Other Ambulatory Visit: Payer: Self-pay | Admitting: Hematology and Oncology

## 2021-08-24 ENCOUNTER — Inpatient Hospital Stay: Payer: 59

## 2021-08-24 ENCOUNTER — Other Ambulatory Visit: Payer: Self-pay

## 2021-08-24 VITALS — BP 121/79 | HR 83 | Temp 98.4°F | Resp 17

## 2021-08-24 DIAGNOSIS — C50111 Malignant neoplasm of central portion of right female breast: Secondary | ICD-10-CM

## 2021-08-24 DIAGNOSIS — Z170 Estrogen receptor positive status [ER+]: Secondary | ICD-10-CM | POA: Diagnosis not present

## 2021-08-24 MED ORDER — LEUPROLIDE ACETATE 3.75 MG IM KIT
3.7500 mg | PACK | Freq: Once | INTRAMUSCULAR | Status: AC
  Administered 2021-08-24: 3.75 mg via INTRAMUSCULAR
  Filled 2021-08-24: qty 3.75

## 2021-08-29 ENCOUNTER — Encounter: Payer: Self-pay | Admitting: Internal Medicine

## 2021-08-29 ENCOUNTER — Telehealth: Payer: Self-pay

## 2021-08-29 NOTE — Telephone Encounter (Addendum)
Patient called, says she had a knot on her left underarm and it now looks like a "cigarette burn."   She is concerned this could be MRSA and has an appointment with wound care on 09/06/21. She states she will send a picture via MyChart shortly and is wondering if she should use bactroban ointment on this area until she is seen by wound care. Will route to provider.   Beryle Flock, RN

## 2021-08-29 NOTE — Telephone Encounter (Signed)
Spoke with Dr. Baxter Flattery, she would like for patient to use Bactroban ointment until she is seen. Called patient and relayed this advice to her. Asked her to still send picture via MyChart and to please call with any changes or concerns. Patient verbalized understanding and has no further questions.   Beryle Flock, RN

## 2021-09-04 ENCOUNTER — Other Ambulatory Visit: Payer: Self-pay

## 2021-09-04 ENCOUNTER — Telehealth: Payer: 59 | Admitting: Internal Medicine

## 2021-09-06 ENCOUNTER — Ambulatory Visit (HOSPITAL_BASED_OUTPATIENT_CLINIC_OR_DEPARTMENT_OTHER): Payer: 59 | Admitting: Internal Medicine

## 2021-09-07 ENCOUNTER — Encounter (HOSPITAL_BASED_OUTPATIENT_CLINIC_OR_DEPARTMENT_OTHER): Payer: 59 | Attending: Internal Medicine | Admitting: Internal Medicine

## 2021-09-07 DIAGNOSIS — L02411 Cutaneous abscess of right axilla: Secondary | ICD-10-CM | POA: Insufficient documentation

## 2021-09-07 DIAGNOSIS — L02211 Cutaneous abscess of abdominal wall: Secondary | ICD-10-CM | POA: Insufficient documentation

## 2021-09-07 DIAGNOSIS — L02412 Cutaneous abscess of left axilla: Secondary | ICD-10-CM | POA: Diagnosis present

## 2021-09-07 DIAGNOSIS — B9562 Methicillin resistant Staphylococcus aureus infection as the cause of diseases classified elsewhere: Secondary | ICD-10-CM | POA: Insufficient documentation

## 2021-09-07 DIAGNOSIS — L03311 Cellulitis of abdominal wall: Secondary | ICD-10-CM | POA: Diagnosis not present

## 2021-09-07 DIAGNOSIS — Z8614 Personal history of Methicillin resistant Staphylococcus aureus infection: Secondary | ICD-10-CM | POA: Diagnosis not present

## 2021-09-07 DIAGNOSIS — Z9011 Acquired absence of right breast and nipple: Secondary | ICD-10-CM | POA: Diagnosis not present

## 2021-09-07 NOTE — Progress Notes (Signed)
[name], [name] ([id number]) Visit Report for 09/07/2021 Chief Complaint Document Details Patient Name: Date of Service: Melissa WELL, [name] 09/07/2021 9:00 A M Medical Record Number: [id number] Patient Account Number: [id number] Date of Birth/Sex: Treating RN: 1970/11/01 (51 y.o. Helene Shoe, Tammi Klippel Primary Care Provider: Threasa Alpha Other Clinician: Referring Provider: Treating Provider/Extender: [place] Service in Treatment: 5 Information Obtained from: Patient Chief Complaint 6/7; patient comes into clinic today for review of a wound on her right lower abdominal wall 7/14; right and left axilla abscess Electronic Signature(s) Signed: 09/07/2021 10:47:13 AM By: Kalman Shan DO Entered By: Kalman Shan on 09/07/2021 10:04:34 -------------------------------------------------------------------------------- HPI Details Patient Name: Date of Service: Melissa WELL, [name] 09/07/2021 9:00 A M Medical Record Number: [id number] Patient Account Number: [id number] Date of Birth/Sex: Treating RN: 1970-03-14 (51 y.o. Melissa Shepherd Primary Care Provider: Threasa Alpha Other Clinician: Referring Provider: Treating Provider/Extender: [place] Service in Treatment: 5 History of Present Illness HPI Description: ADMISSION 08/01/2021 This is a 51 year old woman who has a history of MRSA skin abscesses. This includes her left axilla bilateral groin. She states her problem began in January of this year with wounds and abscesses on her right lower extremity. I did not research this in detail however she developed an abscess of her abdominal wall which rapidly spread. She was seen in the ER on 05/31/2021 with an abdominal wall abscess she was given vancomycin in the ER and discharged on trimethoprim sulfamethoxazole. She required admission to hospital from 4/24 through 06/22/2021 for abdominal wall cellulitis and an abscess. She had a CT scan of the  abdomen while an inpatient that showed cellulitis but no drainable fluid collection. 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 Since discharge she has been followed by Dr. Baxter Flattery. She has had a prolonged course of doxycycline and did have Bactroban topically to the wound but most recently has been using Vaseline gauze and more recently than that just simply a dry dressing. I believe she is using Hibiclens showers. The wound on her abdomen actually is very tiny and superficial. There is no measurable depth. Past medical history includes left breast CA, hyperlipidemia, leg swelling secondary to lymphedema. Readmission 09/07/2021 Ms. Camree Wigington is a 51 year old female with a past medical history of estrogen positive breast cancer that presents with 2 abscesses, 1 located to each axilla. She states that these areas developed 1 week ago and the left axilla has been draining purulent drainage. She currently denies systemic signs of infection. She reports tenderness to the areas. She has been seen in our clinic before for an abscess to her abdomen. She has a history of MRSA cutaneous abscesses. She has Bactroban. She has not been using Hibiclens and seemed unaware of this product. Electronic Signature(s) Signed: 09/07/2021 10:47:13 AM By: Kalman Shan DO Entered By: Kalman Shan on 09/07/2021 10:14:01 -------------------------------------------------------------------------------- Incision and Drainage Details Patient Name: Date of Service: Melissa WELL, [name] 09/07/2021 9:00 A M Medical Record Number: [id number] Patient Account Number: [id number] Date of Birth/Sex: Treating RN: 11-16-1970 (51 y.o. Melissa Shepherd Primary Care Provider: Threasa Alpha Other Clinician: Referring Provider: Treating Provider/Extender: Ciro Backer Weeks in Treatment:  5 Incision And Drainage Performed Wound #1 Right Axilla for: Performed By: Physician Kalman Shan, DO Incision And Drainage Type: Abscess Location: right axilla Level of Consciousness (Pre-  Awake and Alert procedure): Pre-procedure Verification/Time Out Yes - 09:45 Taken: Pain Control: Lidocaine 4% Topical Solution Drainage Of: Purulent Instrument: Blade Bleeding: Moderate Hemostasis Achieved: Pressure Culture Sent: Swab Procedural Pain: 3 Post Procedural Pain: 3 Response to Treatment: Procedure was tolerated well Level of Consciousness (Post- Awake and Alert procedure): Post Procedure Diagnosis Same as Pre-procedure Electronic Signature(s) Signed: 09/07/2021 10:47:13 AM By: Kalman Shan DO Signed: 09/07/2021 5:20:38 PM By: Deon Pilling RN, BSN Entered By: Deon Pilling on 09/07/2021 10:00:44 -------------------------------------------------------------------------------- Incision and Drainage Details Patient Name: Date of Service: Melissa WELL, [name] 09/07/2021 9:00 A M Medical Record Number: [id number] Patient Account Number: [id number] Date of Birth/Sex: Treating RN: [birth year] (51 y.o. Helene Shoe, Tammi Klippel Primary Care Provider: Threasa Alpha Other Clinician: Referring Provider: Treating Provider/Extender: Ciro Backer Weeks in Treatment: 5 Incision And Drainage Performed Wound #2 Left Axilla for: Performed By: Physician Kalman Shan, DO Incision And Drainage Type: Abscess Location: left axilla Level of Consciousness (Pre- Awake and Alert procedure): Pre-procedure Verification/Time Out Yes - 09:45 Taken: Pain Control: Lidocaine 4% Topical Solution Drainage Of: Purulent Instrument: Blade Bleeding: Minimum Hemostasis Achieved: Pressure Culture Sent: None Procedural Pain: 2 Post Procedural Pain: 3 Response to Treatment: Procedure was tolerated well Level of Consciousness (Post- Awake and Alert procedure): Post Procedure  Diagnosis Same as Pre-procedure Electronic Signature(s) Signed: 09/07/2021 10:47:13 AM By: Kalman Shan DO Signed: 09/07/2021 5:20:38 PM By: Deon Pilling RN, BSN Entered By: Deon Pilling on 09/07/2021 10:01:24 -------------------------------------------------------------------------------- Physical Exam Details Patient Name: Date of Service: Melissa WELL, [name] 09/07/2021 9:00 A M Medical Record Number: [id number] Patient Account Number: [id number] Date of Birth/Sex: Treating RN: 04/19/1970 (51 y.o. Melissa Shepherd Primary Care Provider: Threasa Alpha Other Clinician: Referring Provider: Treating Provider/Extender: Ciro Backer Weeks in Treatment: 5 Constitutional respirations regular, non-labored and within target range for patient.Marland Kitchen Psychiatric pleasant and cooperative. Notes T each axilla there is an abscess. The left is actively draining. Post incision there was purulent drainage from both sites. T o enderness to palpation. Electronic Signature(s) Signed: 09/07/2021 10:47:13 AM By: Kalman Shan DO Entered By: Kalman Shan on 09/07/2021 10:15:22 -------------------------------------------------------------------------------- Physician Orders Details Patient Name: Date of Service: Melissa WELL, Rosealynn E. 09/07/2021 9:00 A M Medical Record Number: [id number] Patient Account Number: [id number] Date of Birth/Sex: Treating RN: 12/31/1970 (51 y.o. Melissa Shepherd Primary Care Provider: Threasa Alpha Other Clinician: Referring Provider: Treating Provider/Extender: [place] Service in Treatment: 5 Verbal / Phone Orders: No Diagnosis Coding ICD-10 Coding Code Description L02.211 Cutaneous abscess of abdominal wall B95.62 Methicillin resistant Staphylococcus aureus infection as the cause of diseases classified elsewhere Follow-up Appointments ppointment in 1 week. - Dr. Heber [place] and [facility] Butcher Room 9 Thursday 3pm 09/13/2021 Return  A Other: - Call Infectious Disease r/t areas have reopened. Pick up oral antibiotics from pharmacy and hibiclens today. Bathing/ Shower/ Hygiene May shower and wash wound with soap and water. Other Bathing/Shower/Hygiene Orders/Instructions: - wash with Hibiclens daily. Wound Treatment Wound #1 - Axilla Wound Laterality: Right Cleanser: Hibiclens 1 x Per Day/30 Days Discharge Instructions: wash with hibiclens daily to body and wounds. Peri-Wound Care: Skin Prep 1 x Per Day/30 Days Discharge Instructions: Use skin prep as directed Topical: Mupirocin Ointment 1 x Per Day/30 Days Discharge Instructions: Apply Mupirocin (Bactroban) directly to wound bed. Secondary Dressing: Zetuvit Plus Silicone Border Dressing 4x4 (in/in) 1 x Per Day/30 Days Discharge Instructions: Apply silicone border or bandaid. Wound #2 - Axilla Wound Laterality: Left  Cleanser: Hibiclens 1 x Per Day/30 Days Discharge Instructions: wash with hibiclens daily to body and wounds. Peri-Wound Care: Skin Prep 1 x Per Day/30 Days Discharge Instructions: Use skin prep as directed Topical: Mupirocin Ointment 1 x Per Day/30 Days Discharge Instructions: Apply Mupirocin (Bactroban) directly to wound bed. Secondary Dressing: Zetuvit Plus Silicone Border Dressing 4x4 (in/in) 1 x Per Day/30 Days Discharge Instructions: Apply silicone border or bandaid. Patient Medications llergies: prednisone, morphine, ezetimibe A Notifications Medication Indication Start End 09/07/2021 doxycycline hyclate DOSE 1 - oral 100 mg tablet - 1 tablet oral BID x 7 days Electronic Signature(s) Signed: 09/07/2021 10:47:13 AM By: Kalman Shan DO Previous Signature: 09/07/2021 10:04:17 AM Version By: Kalman Shan DO Entered By: Kalman Shan on 09/07/2021 10:15:30 -------------------------------------------------------------------------------- Problem List Details Patient Name: Date of Service: Melissa WELL, Jadalee E. 09/07/2021 9:00 A M Medical  Record Number: [id number] Patient Account Number: [id number] Date of Birth/Sex: Treating RN: [birth year] (51 y.o. Tonita Phoenix, Lauren Primary Care Provider: Threasa Alpha Other Clinician: Referring Provider: Treating Provider/Extender: Ciro Backer Weeks in Treatment: 5 Active Problems ICD-10 Encounter Code Description Active Date MDM Diagnosis L02.411 Cutaneous abscess of right axilla 09/07/2021 No Yes L02.412 Cutaneous abscess of left axilla 09/07/2021 No Yes B95.62 Methicillin resistant Staphylococcus aureus infection as the cause of diseases 08/01/2021 No Yes classified elsewhere Inactive Problems Resolved Problems ICD-10 Code Description Active Date Resolved Date L02.211 Cutaneous abscess of abdominal wall 08/01/2021 08/01/2021 Electronic Signature(s) Signed: 09/07/2021 10:47:13 AM By: Kalman Shan DO Entered By: Kalman Shan on 09/07/2021 09:59:59 -------------------------------------------------------------------------------- Progress Note Details Patient Name: Date of Service: Melissa WELL, Sherlyne E. 09/07/2021 9:00 A M Medical Record Number: [id number] Patient Account Number: [id number] Date of Birth/Sex: Treating RN: 11-Aug-1970 (51 y.o. Helene Shoe, Tammi Klippel Primary Care Provider: Threasa Alpha Other Clinician: Referring Provider: Treating Provider/Extender: [place] Service in Treatment: 5 Subjective Chief Complaint Information obtained from Patient 6/7; patient comes into clinic today for review of a wound on her right lower abdominal wall 7/14; right and left axilla abscess History of Present Illness (HPI) ADMISSION 08/01/2021 This is a 51 year old woman who has a history of MRSA skin abscesses. This includes her left axilla bilateral groin. She states her problem began in January of this year with wounds and abscesses on her right lower extremity. I did not research this in detail however she developed an abscess of her  abdominal wall which rapidly spread. She was seen in the ER on 05/31/2021 with an abdominal wall abscess she was given vancomycin in the ER and discharged on trimethoprim sulfamethoxazole. She required admission to hospital from 4/24 through 06/22/2021 for abdominal wall cellulitis and an abscess. She had a CT scan of the abdomen while an inpatient that showed cellulitis but no drainable fluid collection. 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 Since discharge she has been followed by Dr. Baxter Flattery. She has had a prolonged course of doxycycline and did have Bactroban topically to the wound but most recently has been using Vaseline gauze and more recently than that just simply a dry dressing. I believe she is using Hibiclens showers. The wound on her abdomen actually is very tiny and superficial. There is no measurable depth. Past medical history includes left breast CA, hyperlipidemia, leg swelling secondary to lymphedema. Readmission 09/07/2021 Ms. Shene Maxfield is a 51 year old female with a past medical history of  estrogen positive breast cancer that presents with 2 abscesses, 1 located to each axilla. She states that these areas developed 1 week ago and the left axilla has been draining purulent drainage. She currently denies systemic signs of infection. She reports tenderness to the areas. She has been seen in our clinic before for an abscess to her abdomen. She has a history of MRSA cutaneous abscesses. She has Bactroban. She has not been using Hibiclens and seemed unaware of this product. Patient History Information obtained from Patient, Chart. Family History Unknown History. Social History Current every day smoker - cigarettes, Marital Status - Married, Alcohol Use - Rarely, Drug Use - No History, Caffeine Use - Moderate. Medical History Hematologic/Lymphatic Patient has  history of Lymphedema Cardiovascular Patient has history of Hypertension Denies history of Angina, Arrhythmia, Congestive Heart Failure, Coronary Artery Disease, Deep Vein Thrombosis, Hypotension, Myocardial Infarction, Peripheral Arterial Disease, Peripheral Venous Disease, Phlebitis, Vasculitis Gastrointestinal Patient has history of Colitis Denies history of Cirrhosis , Crohnoos, Hepatitis A, Hepatitis B, Hepatitis C Integumentary (Skin) Denies history of History of Burn Musculoskeletal Denies history of Gout, Rheumatoid Arthritis, Osteoarthritis, Osteomyelitis Neurologic Denies history of Dementia, Neuropathy, Quadriplegia, Paraplegia, Seizure Disorder Oncologic Denies history of Received Chemotherapy, Received Radiation Psychiatric Denies history of Anorexia/bulimia, Confinement Anxiety Medical A Surgical History Notes nd Cardiovascular hyperlipidemia Gastrointestinal IBS Musculoskeletal arthritis back, knee, legs, ankles Neurologic back pain chronic Oncologic hx breast ca; right mastectomy Psychiatric depression and anxiety!!! Objective Constitutional respirations regular, non-labored and within target range for patient.. Vitals Time Taken: 9:02 AM, Height: 66 in, Weight: 180 lbs, BMI: 29, Temperature: 98.1 F, Pulse: 75 bpm, Respiratory Rate: 20 breaths/min, Blood Pressure: 135/90 mmHg. Psychiatric pleasant and cooperative. General Notes: T each axilla there is an abscess. The left is actively draining. Post incision there was purulent drainage from both sites. T o enderness to palpation. Integumentary (Hair, Skin) Wound #1 status is Open. Original cause of wound was Pimple. The date acquired was: 08/31/2021. The wound is located on the Right Axilla. The wound measures 0.1cm length x 0.1cm width x 0.1cm depth; 0.008cm^2 area and 0.001cm^3 volume. There is no tunneling or undermining noted. There is a medium amount of purulent drainage noted. The wound margin is  distinct with the outline attached to the wound base. There is small (1-33%) red granulation within the wound bed. There is a large (67-100%) amount of necrotic tissue within the wound bed including Adherent Slough. General Notes: Red, indurated Periwound. Warm to touch. Wound #2 status is Open. Original cause of wound was Pimple. The date acquired was: 08/31/2021. The wound is located on the Left Axilla. The wound measures 0.2cm length x 0.2cm width x 0.7cm depth; 0.031cm^2 area and 0.022cm^3 volume. There is Fat Layer (Subcutaneous Tissue) exposed. There is no tunneling noted, however, there is undermining starting at 12:00 and ending at 12:00 with a maximum distance of 0.4cm. There is a medium amount of purulent drainage noted. The wound margin is distinct with the outline attached to the wound base. There is small (1-33%) red, pink granulation within the wound bed. There is a large (67-100%) amount of necrotic tissue within the wound bed including Adherent Slough. Assessment Active Problems ICD-10 Cutaneous abscess of right axilla Cutaneous abscess of left axilla Methicillin resistant Staphylococcus aureus infection as the cause of diseases classified elsewhere Patient presents with 2 abscesses to her axillas. I did an incision and drainage of each area. Purulent drainage was noted. I obtained a culture. She has a history of MRSA  cutaneous abscess. Her last culture done in April from her abdominal abscess grew MRSA sensitive to doxycycline. I went ahead and placed her on doxycycline today. She knows to go to the ED if she develops worsening symptoms. I recommended Bactroban to the wound sites. Follow-up in 1 week. Procedures Wound #1 Pre-procedure diagnosis of Wound #1 is an Abscess located on the Right Axilla . Abscess incision and drainage was provided by Kalman Shan, DO. The skin was cleansed and prepped with anti-septic followed by pain control using Lidocaine 4% T opical Solution. An  incision was made in the right axilla with the following instrument(s): Blade. There was an immediate release of Purulent fluid. A Moderate amount of bleeding was controlled with Pressure. A time out was conducted at 09:45, prior to the start of the procedure. Swab culture was sent. The procedure was tolerated well with a pain level of 3 throughout and a pain level of 3 following the procedure. Post procedure Diagnosis Wound #1: Same as Pre-Procedure Wound #2 Pre-procedure diagnosis of Wound #2 is an Abscess located on the Left Axilla . Abscess incision and drainage was provided by Kalman Shan, DO. The skin was cleansed and prepped with anti-septic followed by pain control using Lidocaine 4% T opical Solution. An incision was made in the left axilla with the following instrument(s): Blade. There was an immediate release of Purulent fluid. A Minimum amount of bleeding was controlled with Pressure. A time out was conducted at 09:45, prior to the start of the procedure. No culture was sent. The procedure was tolerated well with a pain level of 2 throughout and a pain level of 3 following the procedure. Post procedure Diagnosis Wound #2: Same as Pre-Procedure Plan Follow-up Appointments: Return Appointment in 1 week. - Dr. Heber [place] and [facility]ay[facility] Butcher Room 9 Thursday 3pm 09/13/2021 Other: - Call Infectious Disease r/t areas have reopened. Pick up oral antibiotics from pharmacy and hibiclens today. Bathing/ Shower/ Hygiene: May shower and wash wound with soap and water. Other Bathing/Shower/Hygiene Orders/Instructions: - wash with Hibiclens daily. The following medication(s) was prescribed: doxycycline hyclate oral 100 mg tablet 1 1 tablet oral BID x 7 days starting 09/07/2021 WOUND #1: - Axilla Wound Laterality: Right Cleanser: Hibiclens 1 x Per Day/30 Days Discharge Instructions: wash with hibiclens daily to body and wounds. Peri-Wound Care: Skin Prep 1 x Per Day/30 Days Discharge Instructions: Use  skin prep as directed Topical: Mupirocin Ointment 1 x Per Day/30 Days Discharge Instructions: Apply Mupirocin (Bactroban) directly to wound bed. Secondary Dressing: Zetuvit Plus Silicone Border Dressing 4x4 (in/in) 1 x Per Day/30 Days Discharge Instructions: Apply silicone border or bandaid. WOUND #2: - Axilla Wound Laterality: Left Cleanser: Hibiclens 1 x Per Day/30 Days Discharge Instructions: wash with hibiclens daily to body and wounds. Peri-Wound Care: Skin Prep 1 x Per Day/30 Days Discharge Instructions: Use skin prep as directed Topical: Mupirocin Ointment 1 x Per Day/30 Days Discharge Instructions: Apply Mupirocin (Bactroban) directly to wound bed. Secondary Dressing: Zetuvit Plus Silicone Border Dressing 4x4 (in/in) 1 x Per Day/30 Days Discharge Instructions: Apply silicone border or bandaid. 1. Incision and drainage 2. Doxycycline 3. Bactroban Electronic Signature(s) Signed: 09/07/2021 10:47:13 AM By: Kalman Shan DO Entered By: Kalman Shan on 09/07/2021 10:46:12 -------------------------------------------------------------------------------- HxROS Details Patient Name: Date of Service: Melissa WELL, Kayleanna E. 09/07/2021 9:00 A M Medical Record Number: [id number] Patient Account Number: [id number] Date of Birth/Sex: Treating RN: 11-30-1970 (51 y.o. Helene Shoe, Tammi Klippel Primary Care Provider: Threasa Alpha Other Clinician: Referring Provider: Treating Provider/Extender: Heber [place]  Sharmaine Base , Malachy Mood Weeks in Treatment: 5 Information Obtained From Patient Chart Hematologic/Lymphatic Medical History: Positive for: Lymphedema Cardiovascular Medical History: Positive for: Hypertension Negative for: Angina; Arrhythmia; Congestive Heart Failure; Coronary Artery Disease; Deep Vein Thrombosis; Hypotension; Myocardial Infarction; Peripheral Arterial Disease; Peripheral Venous Disease; Phlebitis; Vasculitis Past Medical History  Notes: hyperlipidemia Gastrointestinal Medical History: Positive for: Colitis Negative for: Cirrhosis ; Crohns; Hepatitis A; Hepatitis B; Hepatitis C Past Medical History Notes: IBS Integumentary (Skin) Medical History: Negative for: History of Burn Musculoskeletal Medical History: Negative for: Gout; Rheumatoid Arthritis; Osteoarthritis; Osteomyelitis Past Medical History Notes: arthritis back, knee, legs, ankles Neurologic Medical History: Negative for: Dementia; Neuropathy; Quadriplegia; Paraplegia; Seizure Disorder Past Medical History Notes: back pain chronic Oncologic Medical History: Negative for: Received Chemotherapy; Received Radiation Past Medical History Notes: hx breast ca; right mastectomy Psychiatric Medical History: Negative for: Anorexia/bulimia; Confinement Anxiety Past Medical History Notes: depression and anxiety!!! Immunizations Pneumococcal Vaccine: Received Pneumococcal Vaccination: No Implantable Devices None Family and Social History Unknown History: Yes; Current every day smoker - cigarettes; Marital Status - Married; Alcohol Use: Rarely; Drug Use: No History; Caffeine Use: Moderate; Financial Concerns: No; Food, Clothing or Shelter Needs: No; Support System Lacking: No; Transportation Concerns: No Electronic Signature(s) Signed: 09/07/2021 10:47:13 AM By: Kalman Shan DO Signed: 09/07/2021 5:20:38 PM By: Deon Pilling RN, BSN Entered By: Kalman Shan on 09/07/2021 10:14:16 -------------------------------------------------------------------------------- SuperBill Details Patient Name: Date of Service: Melissa WELL, Jaicey E. 09/07/2021 Medical Record Number: [id number] Patient Account Number: [id number] Date of Birth/Sex: Treating RN: 11-08-1970 (51 y.o. Helene Shoe, Meta.Reding Primary Care Provider: Threasa Alpha Other Clinician: Referring Provider: Treating Provider/Extender: Ciro Backer Weeks in Treatment:  5 Diagnosis Coding ICD-10 Codes Code Description L02.411 Cutaneous abscess of right axilla L02.412 Cutaneous abscess of left axilla B95.62 Methicillin resistant Staphylococcus aureus infection as the cause of diseases classified elsewhere Facility Procedures CPT4 Code: 11552080 Description: 22336 - I and D Abscess; simple/single ICD-10 Diagnosis Description L02.411 Cutaneous abscess of right axilla L02.412 Cutaneous abscess of left axilla Modifier: Quantity: 1 Physician Procedures : CPT4 Code Description Modifier [id number] - WC PHYS LEVEL 4 - EST PT ICD-10 Diagnosis Description L02.411 Cutaneous abscess of right axilla L02.412 Cutaneous abscess of left axilla B95.62 Methicillin resistant Staphylococcus aureus infection as the  cause of diseases classified elsewhere Quantity: 1 : [id number] 10060 - I and D Abscess; simple/single ICD-10 Diagnosis Description L02.411 Cutaneous abscess of right axilla L02.412 Cutaneous abscess of left axilla Quantity: 1 Electronic Signature(s) Signed: 09/07/2021 10:47:13 AM By: Kalman Shan DO Entered By: Kalman Shan on 09/07/2021 10:46:35

## 2021-09-07 NOTE — Progress Notes (Signed)
Melissa Shepherd, Melissa Shepherd ([id number]) Visit Report for 09/07/2021 Arrival Information Details Patient Name: Date of Service: Melissa Shepherd, Melissa Shepherd. 09/07/2021 9:00 A M Medical Record Number: [id number] Patient Account Number: [id number] Date of Birth/Sex: Treating RN: May 09, 1970 (51 y.o. Helene Shoe, Meta.Reding Primary Care Nesiah Jump: Threasa Alpha Other Clinician: Referring Ozetta Flatley: Treating Evon Lopezperez/Extender: [place] Service in Treatment: 5 Visit Information History Since Last Visit Added or deleted any medications: No Patient Arrived: Ambulatory Any new allergies or adverse reactions: No Arrival Time: 09:02 Had a fall or experienced change in No Accompanied By: self activities of daily living that may affect Transfer Assistance: None risk of falls: Patient Identification Verified: Yes Signs or symptoms of abuse/neglect since last visito No Patient Requires Transmission-Based Precautions: No Hospitalized since last visit: No Patient Has Alerts: No Implantable device outside of the clinic excluding No cellular tissue based products placed in the center since last visit: Has Dressing in Place as Prescribed: Yes Pain Present Now: No Electronic Signature(s) Signed: 09/07/2021 5:20:38 PM By: Deon Pilling RN, BSN Entered By: Deon Pilling on 09/07/2021 09:03:09 -------------------------------------------------------------------------------- Encounter Discharge Information Details Patient Name: Date of Service: Melissa Shepherd, [name] 09/07/2021 9:00 A M Medical Record Number: [id number] Patient Account Number: [id number] Date of Birth/Sex: Treating RN: 1970-09-01 (51 y.o. Helene Shoe, Tammi Klippel Primary Care Lonald Troiani: Threasa Alpha Other Clinician: Referring Calina Patrie: Treating Kristyanna Barcelo/Extender: [place] Service in Treatment: 5 Encounter Discharge Information Items Post Procedure Vitals Discharge Condition: Stable Temperature (F): 98.1 Ambulatory Status:  Ambulatory Pulse (bpm): 75 Discharge Destination: Home Respiratory Rate (breaths/min): 20 Transportation: Private Auto Blood Pressure (mmHg): 135/90 Accompanied By: self Schedule Follow-up Appointment: Yes Clinical Summary of Care: Electronic Signature(s) Signed: 09/07/2021 5:20:38 PM By: Deon Pilling RN, BSN Entered By: Deon Pilling on 09/07/2021 10:06:13 -------------------------------------------------------------------------------- Lower Extremity Assessment Details Patient Name: Date of Service: Melissa Shepherd, [name] 09/07/2021 9:00 A M Medical Record Number: [id number] Patient Account Number: [id number] Date of Birth/Sex: Treating RN: [birth year] (51 y.o. Debby Bud Primary Care Dashley Monts: Threasa Alpha Other Clinician: Referring Aarav Burgett: Treating Atzel Mccambridge/Extender: Ciro Backer Weeks in Treatment: 5 Electronic Signature(s) Signed: 09/07/2021 5:20:38 PM By: Deon Pilling RN, BSN Entered By: Deon Pilling on 09/07/2021 09:04:02 -------------------------------------------------------------------------------- Multi Wound Chart Details Patient Name: Date of Service: Melissa Shepherd, [name] 09/07/2021 9:00 A M Medical Record Number: [id number] Patient Account Number: [id number] Date of Birth/Sex: Treating RN: October 12, 1970 (51 y.o. Helene Shoe, Tammi Klippel Primary Care Trey Bebee: Threasa Alpha Other Clinician: Referring Brendyn Mclaren: Treating Nasiah Polinsky/Extender: Ciro Backer Weeks in Treatment: 5 Vital Signs Height(in): 66 Pulse(bpm): 75 Weight(lbs): 180 Blood Pressure(mmHg): 135/90 Body Mass Index(BMI): 29 Temperature(F): 98.1 Respiratory Rate(breaths/min): 20 Photos: [N/A:N/A] Right Axilla Left Axilla N/A Wound Location: Pimple Pimple N/A Wounding Event: Abscess Abscess N/A Primary Etiology: Lymphedema, Hypertension, Colitis Lymphedema, Hypertension, Colitis N/A Comorbid History: 08/31/2021 08/31/2021 N/A Date Acquired: 0 0 N/A Weeks  of Treatment: Open Open N/A Wound Status: No No N/A Wound Recurrence: 0.1x0.1x0.1 0.2x0.2x0.7 N/A Measurements L x W x D (cm) 0.008 0.031 N/A A (cm) : rea 0.001 0.022 N/A Volume (cm) : 0.00% N/A N/A % Reduction in A rea: 0.00% N/A N/A % Reduction in Volume: 12 Starting Position 1 (o'clock): 12 Ending Position 1 (o'clock): 0.4 Maximum Distance 1 (cm): No Yes N/A Undermining: Full Thickness Without Exposed Full Thickness Without Exposed N/A Classification: Support Structures Support Structures Medium Medium N/A Exudate Amount: Purulent Purulent N/A Exudate Type: yellow, brown, green yellow, brown, green  N/A Exudate Color: Distinct, outline attached Distinct, outline attached N/A Wound Margin: Small (1-33%) Small (1-33%) N/A Granulation Amount: Red Red, Pink N/A Granulation Quality: Large (67-100%) Large (67-100%) N/A Necrotic Amount: Fascia: No Fat Layer (Subcutaneous Tissue): Yes N/A Exposed Structures: Fat Layer (Subcutaneous Tissue): No Fascia: No Tendon: No Tendon: No Muscle: No Muscle: No Joint: No Joint: No Bone: No Bone: No None None N/A Epithelialization: Red, indurated Periwound. Warm to N/A N/A Assessment Notes: touch. Treatment Notes Electronic Signature(s) Signed: 09/07/2021 10:47:13 AM By: Kalman Shan DO Signed: 09/07/2021 5:20:38 PM By: Deon Pilling RN, BSN Entered By: Kalman Shan on 09/07/2021 10:00:11 -------------------------------------------------------------------------------- [place] Details Patient Name: Date of Service: Melissa Shepherd, [name] 09/07/2021 9:00 A M Medical Record Number: [id number] Patient Account Number: [id number] Date of Birth/Sex: Treating RN: 04-26-1970 (51 y.o. Helene Shoe, Tammi Klippel Primary Care Kalem Rockwell: Threasa Alpha Other Clinician: Referring Arali Somera: Treating Jakylah Bassinger/Extender: [place] Service in Treatment: 5 Active Inactive Orientation to the  Wound Care Program Nursing Diagnoses: Knowledge deficit related to the wound healing center program Goals: Patient/caregiver will verbalize understanding of the [place] Program Date Initiated: 09/07/2021 Target Resolution Date: 09/29/2021 Goal Status: Active Interventions: Provide education on orientation to the wound center Notes: Soft Tissue Infection Nursing Diagnoses: Impaired tissue integrity Knowledge deficit related to disease process and management Goals: Patient's soft tissue infection will resolve Date Initiated: 09/07/2021 Target Resolution Date: 09/27/2021 Goal Status: Active Signs and symptoms of infection will be recognized early to allow for prompt treatment Date Initiated: 09/07/2021 Target Resolution Date: 09/27/2021 Goal Status: Active Interventions: Assess signs and symptoms of infection every visit Provide education on infection Treatment Activities: Culture : 09/07/2021 Culture and sensitivity : 09/07/2021 Systemic antibiotics : 09/07/2021 Notes: Electronic Signature(s) Signed: 09/07/2021 5:20:38 PM By: Deon Pilling RN, BSN Entered By: Deon Pilling on 09/07/2021 09:22:11 -------------------------------------------------------------------------------- Pain Assessment Details Patient Name: Date of Service: Melissa Shepherd, [name] 09/07/2021 9:00 A M Medical Record Number: [id number] Patient Account Number: [id number] Date of Birth/Sex: Treating RN: Nov 09, 1970 (51 y.o. Debby Bud Primary Care Maleik Vanderzee: Threasa Alpha Other Clinician: Referring Samah Lapiana: Treating Trevor Wilkie/Extender: [place] Service in Treatment: 5 Active Problems Location of Pain Severity and Description of Pain Patient Has Paino Yes Site Locations Pain Location: Pain in Ulcers Rate the pain. Current Pain Level: 9 Pain Management and Medication Current Pain Management: Medication: No Cold Application: No Rest: No Massage: No Activity:  No T.ShepherdN.S.: No Heat Application: No Leg drop or elevation: No Is the Current Pain Management Adequate: Adequate How does your wound impact your activities of daily livingo Sleep: No Bathing: No Appetite: No Relationship With Others: No Bladder Continence: No Emotions: No Bowel Continence: No Work: No Toileting: No Drive: No Dressing: No Hobbies: No Engineer, maintenance) Signed: 09/07/2021 5:20:38 PM By: Deon Pilling RN, BSN Entered By: Deon Pilling on 09/07/2021 09:03:58 -------------------------------------------------------------------------------- Patient/Caregiver Education Details Patient Name: Date of Service: Melissa Shepherd, [name] 7/14/2023andnbsp9:00 A M Medical Record Number: [id number] Patient Account Number: [id number] Date of Birth/Gender: Treating RN: 31-Aug-1970 (51 y.o. Debby Bud Primary Care Physician: Threasa Alpha Other Clinician: Referring Physician: Treating Physician/Extender: [place] Service in Treatment: 5 Education Assessment Education Provided To: Patient Education Topics Provided Infection: Handouts: CDC antimicrobial patient education_English, Infection Prevention and Management Methods: Explain/Verbal Responses: Reinforcements needed Electronic Signature(s) Signed: 09/07/2021 5:20:38 PM By: Deon Pilling RN, BSN Entered By: Deon Pilling on 09/07/2021 09:22:24 -------------------------------------------------------------------------------- Wound Assessment Details Patient Name:  Date of Service: Melissa Shepherd, [name] 09/07/2021 9:00 A M Medical Record Number: [id number] Patient Account Number: [id number] Date of Birth/Sex: Treating RN: 05/23/1970 (51 y.o. Tonita Phoenix, Lauren Primary Care Caz Weaver: Threasa Alpha Other Clinician: Referring Tammy Wickliffe: Treating Hassan Blackshire/Extender: Ciro Backer Weeks in Treatment: 5 Wound Status Wound Number: 1 Primary Etiology: Abscess Wound Location:  Right Axilla Wound Status: Open Wounding Event: Pimple Comorbid History: Lymphedema, Hypertension, Colitis Date Acquired: 08/31/2021 Weeks Of Treatment: 0 Clustered Wound: No Photos Wound Measurements Length: (cm) 0.1 Width: (cm) 0.1 Depth: (cm) 0.1 Area: (cm) 0.008 Volume: (cm) 0.001 % Reduction in Area: 0% % Reduction in Volume: 0% Epithelialization: None Tunneling: No Undermining: No Wound Description Classification: Full Thickness Without Exposed Support Struct Wound Margin: Distinct, outline attached Exudate Amount: Medium Exudate Type: Purulent Exudate Color: yellow, brown, green ures Foul Odor After Cleansing: No Slough/Fibrino No Wound Bed Granulation Amount: Small (1-33%) Exposed Structure Granulation Quality: Red Fascia Exposed: No Necrotic Amount: Large (67-100%) Fat Layer (Subcutaneous Tissue) Exposed: No Necrotic Quality: Adherent Slough Tendon Exposed: No Muscle Exposed: No Joint Exposed: No Bone Exposed: No Assessment Notes Red, indurated Periwound. Warm to touch. Treatment Notes Wound #1 (Axilla) Wound Laterality: Right Cleanser Hibiclens Discharge Instruction: wash with hibiclens daily to body and wounds. Peri-Wound Care Skin Prep Discharge Instruction: Use skin prep as directed Topical Mupirocin Ointment Discharge Instruction: Apply Mupirocin (Bactroban) directly to wound bed. Primary Dressing Secondary Dressing Zetuvit Plus Silicone Border Dressing 4x4 (in/in) Discharge Instruction: Apply silicone border or bandaid. Secured With Compression Wrap Compression Stockings Environmental education officer) Signed: 09/07/2021 11:57:18 AM By: Rhae Hammock RN Entered By: Rhae Hammock on 09/07/2021 09:20:22 -------------------------------------------------------------------------------- Wound Assessment Details Patient Name: Date of Service: Melissa Shepherd, [name] 09/07/2021 9:00 A M Medical Record Number: [id number] Patient Account Number:  [id number] Date of Birth/Sex: Treating RN: 02-24-1971 (51 y.o. Tonita Phoenix, Lauren Primary Care Theodoros Stjames: Threasa Alpha Other Clinician: Referring Rokia Bosket: Treating Yvonnie Schinke/Extender: Ciro Backer Weeks in Treatment: 5 Wound Status Wound Number: 2 Primary Etiology: Abscess Wound Location: Left Axilla Wound Status: Open Wounding Event: Pimple Comorbid History: Lymphedema, Hypertension, Colitis Date Acquired: 08/31/2021 Weeks Of Treatment: 0 Clustered Wound: No Photos Wound Measurements Length: (cm) 0.2 Width: (cm) 0.2 Depth: (cm) 0.7 Area: (cm) 0.031 Volume: (cm) 0.022 % Reduction in Area: % Reduction in Volume: Epithelialization: None Tunneling: No Undermining: Yes Starting Position (o'clock): 12 Ending Position (o'clock): 12 Maximum Distance: (cm) 0.4 Wound Description Classification: Full Thickness Without Exposed Support Structures Wound Margin: Distinct, outline attached Exudate Amount: Medium Exudate Type: Purulent Exudate Color: yellow, brown, green Foul Odor After Cleansing: No Slough/Fibrino Yes Wound Bed Granulation Amount: Small (1-33%) Exposed Structure Granulation Quality: Red, Pink Fascia Exposed: No Necrotic Amount: Large (67-100%) Fat Layer (Subcutaneous Tissue) Exposed: Yes Necrotic Quality: Adherent Slough Tendon Exposed: No Muscle Exposed: No Joint Exposed: No Bone Exposed: No Treatment Notes Wound #2 (Axilla) Wound Laterality: Left Cleanser Hibiclens Discharge Instruction: wash with hibiclens daily to body and wounds. Peri-Wound Care Skin Prep Discharge Instruction: Use skin prep as directed Topical Mupirocin Ointment Discharge Instruction: Apply Mupirocin (Bactroban) directly to wound bed. Primary Dressing Secondary Dressing Zetuvit Plus Silicone Border Dressing 4x4 (in/in) Discharge Instruction: Apply silicone border or bandaid. Secured With Compression Wrap Compression Stockings Sport and exercise psychologist) Signed: 09/07/2021 11:57:18 AM By: Rhae Hammock RN Entered By: Rhae Hammock on 09/07/2021 09:20:12 -------------------------------------------------------------------------------- Vitals Details Patient Name: Date of Service: Melissa Shepherd, [name] 09/07/2021 9:00 A M Medical Record Number: [id number] Patient  Account Number: [id number] Date of Birth/Sex: Treating RN: 1970/06/16 (51 y.o. Helene Shoe, Tammi Klippel Primary Care Ilana Prezioso: Threasa Alpha Other Clinician: Referring Manfred Laspina: Treating AmeLie Hollars/Extender: [place] Service in Treatment: 5 Vital Signs Time Taken: 09:02 Temperature (F): 98.1 Height (in): 66 Pulse (bpm): 75 Weight (lbs): 180 Respiratory Rate (breaths/min): 20 Body Mass Index (BMI): 29 Blood Pressure (mmHg): 135/90 Reference Range: 80 - 120 mg / dl Electronic Signature(s) Signed: 09/07/2021 5:20:38 PM By: Deon Pilling RN, BSN Entered By: Deon Pilling on 09/07/2021 09:03:23

## 2021-09-11 LAB — AEROBIC CULTURE W GRAM STAIN (SUPERFICIAL SPECIMEN)

## 2021-09-13 ENCOUNTER — Encounter (HOSPITAL_BASED_OUTPATIENT_CLINIC_OR_DEPARTMENT_OTHER): Payer: 59 | Admitting: Internal Medicine

## 2021-09-13 DIAGNOSIS — L02412 Cutaneous abscess of left axilla: Secondary | ICD-10-CM | POA: Diagnosis not present

## 2021-09-13 DIAGNOSIS — B9562 Methicillin resistant Staphylococcus aureus infection as the cause of diseases classified elsewhere: Secondary | ICD-10-CM

## 2021-09-13 DIAGNOSIS — L02411 Cutaneous abscess of right axilla: Secondary | ICD-10-CM | POA: Diagnosis not present

## 2021-09-13 NOTE — Progress Notes (Signed)
Melissa Shepherd, Melissa Shepherd ([id number]) Visit Report for 09/13/2021 Chief Complaint Document Details Patient Name: Date of Service: Melissa Shepherd, Melissa Shepherd E. 09/13/2021 3:00 PM Medical Record Number: [id number] Patient Account Number: [id number] Date of Birth/Sex: Treating RN: 03/18/1970 (51 y.o. Tonita Phoenix, Lauren Primary Care Provider: Threasa Alpha Other Clinician: Referring Provider: Treating Provider/Extender: [place] Service in Treatment: 6 Information Obtained from: Patient Chief Complaint 6/7; patient comes into clinic today for review of a wound on her right lower abdominal wall 7/14; right and left axilla abscess Electronic Signature(s) Signed: 09/13/2021 4:23:48 PM By: Kalman Shan DO Entered By: Kalman Shan on 09/13/2021 16:15:56 -------------------------------------------------------------------------------- HPI Details Patient Name: Date of Service: Melissa Shepherd, [name] 09/13/2021 3:00 PM Medical Record Number: [id number] Patient Account Number: [id number] Date of Birth/Sex: Treating RN: Sep 19, 1970 (51 y.o. Tonita Phoenix, Lauren Primary Care Provider: Threasa Alpha Other Clinician: Referring Provider: Treating Provider/Extender: [place] Service in Treatment: 6 History of Present Illness HPI Description: ADMISSION 08/01/2021 This is a 51 year old woman who has a history of MRSA skin abscesses. This includes her left axilla bilateral groin. She states her problem began in January of this year with wounds and abscesses on her right lower extremity. I did not research this in detail however she developed an abscess of her abdominal wall which rapidly spread. She was seen in the ER on 05/31/2021 with an abdominal wall abscess she was given vancomycin in the ER and discharged on trimethoprim sulfamethoxazole. She required admission to hospital from 4/24 through 06/22/2021 for abdominal wall cellulitis and an abscess. She had a CT scan of the  abdomen while an inpatient that showed cellulitis but no drainable fluid collection. 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 Since discharge she has been followed by Dr. Baxter Flattery. She has had a prolonged course of doxycycline and did have Bactroban topically to the wound but most recently has been using Vaseline gauze and more recently than that just simply a dry dressing. I believe she is using Hibiclens showers. The wound on her abdomen actually is very tiny and superficial. There is no measurable depth. Past medical history includes left breast CA, hyperlipidemia, leg swelling secondary to lymphedema. Readmission 09/07/2021 Melissa Shepherd is a 51 year old female with a past medical history of estrogen positive breast cancer that presents with 2 abscesses, 1 located to each axilla. She states that these areas developed 1 week ago and the left axilla has been draining purulent drainage. She currently denies systemic signs of infection. She reports tenderness to the areas. She has been seen in our clinic before for an abscess to her abdomen. She has a history of MRSA cutaneous abscesses. She has Bactroban. She has not been using Hibiclens and seemed unaware of this product. 7/20; patient presents for follow-up. She is almost done with doxycycline. Her wound culture showed MRSA sensitive to doxycycline. She has been using Hibiclens daily. She has been using Bactroban to the previous abscess sites. She reports no open wounds or drainage. She reports that her symptoms have resolved. Electronic Signature(s) Signed: 09/13/2021 4:23:48 PM By: Kalman Shan DO Signed: 09/13/2021 4:23:48 PM By: Kalman Shan DO Entered By: Kalman Shan on 09/13/2021 16:18:31 -------------------------------------------------------------------------------- Physical Exam Details Patient Name: Date of  Service: Melissa Shepherd, [name] 09/13/2021 3:00 PM Medical Record Number: [id number] Patient Account Number: [id number] Date of Birth/Sex: Treating RN: November 19, 1970 (  51 y.o. Tonita Phoenix, Lauren Primary Care Provider: Threasa Alpha Other Clinician: Referring Provider: Treating Provider/Extender: Ciro Backer Weeks in Treatment: 6 Constitutional respirations regular, non-labored and within target range for patient.Marland Kitchen Psychiatric pleasant and cooperative. Notes T each axilla there is no abscess or open wounds. No drainage on palpation. No tenderness to palpation. o Electronic Signature(s) Signed: 09/13/2021 4:23:48 PM By: Kalman Shan DO Entered By: Kalman Shan on 09/13/2021 16:19:28 -------------------------------------------------------------------------------- Physician Orders Details Patient Name: Date of Service: Melissa Shepherd, [name] 09/13/2021 3:00 PM Medical Record Number: [id number] Patient Account Number: [id number] Date of Birth/Sex: Treating RN: 1970-12-21 (51 y.o. Tonita Phoenix, Lauren Primary Care Provider: Threasa Alpha Other Clinician: Referring Provider: Treating Provider/Extender: [place] Service in Treatment: 6 Verbal / Phone Orders: No Diagnosis Coding ICD-10 Coding Code Description L02.411 Cutaneous abscess of right axilla L02.412 Cutaneous abscess of left axilla B95.62 Methicillin resistant Staphylococcus aureus infection as the cause of diseases classified elsewhere Wound Treatment Electronic Signature(s) Signed: 09/13/2021 4:23:48 PM By: Kalman Shan DO Entered By: Kalman Shan on 09/13/2021 16:19:38 -------------------------------------------------------------------------------- Problem List Details Patient Name: Date of Service: Melissa Shepherd, [name] 09/13/2021 3:00 PM Medical Record Number: [id number] Patient Account Number: [id number] Date of Birth/Sex: Treating RN: Jul 11, 1970 (51 y.o. Tonita Phoenix,  Lauren Primary Care Provider: Threasa Alpha Other Clinician: Referring Provider: Treating Provider/Extender: Ciro Backer Weeks in Treatment: 6 Active Problems ICD-10 Encounter Code Description Active Date MDM Diagnosis L02.411 Cutaneous abscess of right axilla 09/07/2021 No Yes L02.412 Cutaneous abscess of left axilla 09/07/2021 No Yes B95.62 Methicillin resistant Staphylococcus aureus infection as the cause of diseases 08/01/2021 No Yes classified elsewhere Inactive Problems Resolved Problems ICD-10 Code Description Active Date Resolved Date L02.211 Cutaneous abscess of abdominal wall 08/01/2021 08/01/2021 Electronic Signature(s) Signed: 09/13/2021 4:23:48 PM By: Kalman Shan DO Entered By: Kalman Shan on 09/13/2021 16:15:36 -------------------------------------------------------------------------------- Progress Note Details Patient Name: Date of Service: Melissa Shepherd, [name] 09/13/2021 3:00 PM Medical Record Number: [id number] Patient Account Number: [id number] Date of Birth/Sex: Treating RN: [birth year] (51 y.o. Tonita Phoenix, Lauren Primary Care Provider: Threasa Alpha Other Clinician: Referring Provider: Treating Provider/Extender: [place] Service in Treatment: 6 Subjective Chief Complaint Information obtained from Patient 6/7; patient comes into clinic today for review of a wound on her right lower abdominal wall 7/14; right and left axilla abscess History of Present Illness (HPI) ADMISSION 08/01/2021 This is a 51 year old woman who has a history of MRSA skin abscesses. This includes her left axilla bilateral groin. She states her problem began in January of this year with wounds and abscesses on her right lower extremity. I did not research this in detail however she developed an abscess of her abdominal wall which rapidly spread. She was seen in the ER on 05/31/2021 with an abdominal wall abscess she was given vancomycin in  the ER and discharged on trimethoprim sulfamethoxazole. She required admission to hospital from 4/24 through 06/22/2021 for abdominal wall cellulitis and an abscess. She had a CT scan of the abdomen while an inpatient that showed cellulitis but no drainable fluid collection. 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 Since discharge she has been followed by Dr. Baxter Flattery. She has had a prolonged course of doxycycline and did have Bactroban topically to the wound but most recently has been using Vaseline gauze and more recently  than that just simply a dry dressing. I believe she is using Hibiclens showers. The wound on her abdomen actually is very tiny and superficial. There is no measurable depth. Past medical history includes left breast CA, hyperlipidemia, leg swelling secondary to lymphedema. Readmission 09/07/2021 Melissa Shepherd is a 51 year old female with a past medical history of estrogen positive breast cancer that presents with 2 abscesses, 1 located to each axilla. She states that these areas developed 1 week ago and the left axilla has been draining purulent drainage. She currently denies systemic signs of infection. She reports tenderness to the areas. She has been seen in our clinic before for an abscess to her abdomen. She has a history of MRSA cutaneous abscesses. She has Bactroban. She has not been using Hibiclens and seemed unaware of this product. 7/20; patient presents for follow-up. She is almost done with doxycycline. Her wound culture showed MRSA sensitive to doxycycline. She has been using Hibiclens daily. She has been using Bactroban to the previous abscess sites. She reports no open wounds or drainage. She reports that her symptoms have resolved. Patient History Information obtained from Patient, Chart. Family History Unknown History. Social History Current  every day smoker - cigarettes, Marital Status - Married, Alcohol Use - Rarely, Drug Use - No History, Caffeine Use - Moderate. Medical History Hematologic/Lymphatic Patient has history of Lymphedema Cardiovascular Patient has history of Hypertension Denies history of Angina, Arrhythmia, Congestive Heart Failure, Coronary Artery Disease, Deep Vein Thrombosis, Hypotension, Myocardial Infarction, Peripheral Arterial Disease, Peripheral Venous Disease, Phlebitis, Vasculitis Gastrointestinal Patient has history of Colitis Denies history of Cirrhosis , Crohnoos, Hepatitis A, Hepatitis B, Hepatitis C Integumentary (Skin) Denies history of History of Burn Musculoskeletal Denies history of Gout, Rheumatoid Arthritis, Osteoarthritis, Osteomyelitis Neurologic Denies history of Dementia, Neuropathy, Quadriplegia, Paraplegia, Seizure Disorder Oncologic Denies history of Received Chemotherapy, Received Radiation Psychiatric Denies history of Anorexia/bulimia, Confinement Anxiety Medical A Surgical History Notes nd Cardiovascular hyperlipidemia Gastrointestinal IBS Musculoskeletal arthritis back, knee, legs, ankles Neurologic back pain chronic Oncologic hx breast ca; right mastectomy Psychiatric depression and anxiety!!! Objective Constitutional respirations regular, non-labored and within target range for patient.. Vitals Time Taken: 3:04 PM, Height: 66 in, Weight: 180 lbs, BMI: 29, Temperature: 98.4 F, Pulse: 65 bpm, Respiratory Rate: 17 breaths/min, Blood Pressure: 112/77 mmHg. Psychiatric pleasant and cooperative. General Notes: T each axilla there is no abscess or open wounds. No drainage on palpation. No tenderness to palpation. o Integumentary (Hair, Skin) Wound #1 status is Open. Original cause of wound was Pimple. The date acquired was: 08/31/2021. The wound is located on the Right Axilla. The wound measures 0cm length x 0cm width x 0cm depth; 0cm^2 area and 0cm^3 volume.  There is a medium amount of purulent drainage noted. The wound margin is distinct with the outline attached to the wound base. There is small (1-33%) red granulation within the wound bed. There is a large (67-100%) amount of necrotic tissue within the wound bed including Adherent Slough. Wound #2 status is Open. Original cause of wound was Pimple. The date acquired was: 08/31/2021. The wound is located on the Left Axilla. The wound measures 0cm length x 0cm width x 0cm depth; 0cm^2 area and 0cm^3 volume. There is Fat Layer (Subcutaneous Tissue) exposed. There is a medium amount of purulent drainage noted. The wound margin is distinct with the outline attached to the wound base. There is small (1-33%) red, pink granulation within the wound bed. There is a large (67-100%) amount of necrotic tissue within  the wound bed including Adherent Slough. Assessment Active Problems ICD-10 Cutaneous abscess of right axilla Cutaneous abscess of left axilla Methicillin resistant Staphylococcus aureus infection as the cause of diseases classified elsewhere Patient has done Shepherd with doxycycline and Bactroban. Her wounds have healed. I recommended continuing Hibiclens Daily for the next week and then once weekly for the next 2 months. Plan 1. Discharge from clinic due to closed wounds 2. Follow-up as needed 3. Hibiclens Electronic Signature(s) Signed: 09/13/2021 4:23:48 PM By: Kalman Shan DO Entered By: Kalman Shan on 09/13/2021 16:20:44 -------------------------------------------------------------------------------- HxROS Details Patient Name: Date of Service: Melissa Shepherd, [name] 09/13/2021 3:00 PM Medical Record Number: [id number] Patient Account Number: [id number] Date of Birth/Sex: Treating RN: 11/17/1970 (51 y.o. Tonita Phoenix, Lauren Primary Care Provider: Threasa Alpha Other Clinician: Referring Provider: Treating Provider/Extender: [place] Service in Treatment:  6 Information Obtained From Patient Chart Hematologic/Lymphatic Medical History: Positive for: Lymphedema Cardiovascular Medical History: Positive for: Hypertension Negative for: Angina; Arrhythmia; Congestive Heart Failure; Coronary Artery Disease; Deep Vein Thrombosis; Hypotension; Myocardial Infarction; Peripheral Arterial Disease; Peripheral Venous Disease; Phlebitis; Vasculitis Past Medical History Notes: hyperlipidemia Gastrointestinal Medical History: Positive for: Colitis Negative for: Cirrhosis ; Crohns; Hepatitis A; Hepatitis B; Hepatitis C Past Medical History Notes: IBS Integumentary (Skin) Medical History: Negative for: History of Burn Musculoskeletal Medical History: Negative for: Gout; Rheumatoid Arthritis; Osteoarthritis; Osteomyelitis Past Medical History Notes: arthritis back, knee, legs, ankles Neurologic Medical History: Negative for: Dementia; Neuropathy; Quadriplegia; Paraplegia; Seizure Disorder Past Medical History Notes: back pain chronic Oncologic Medical History: Negative for: Received Chemotherapy; Received Radiation Past Medical History Notes: hx breast ca; right mastectomy Psychiatric Medical History: Negative for: Anorexia/bulimia; Confinement Anxiety Past Medical History Notes: depression and anxiety!!! Immunizations Pneumococcal Vaccine: Received Pneumococcal Vaccination: No Implantable Devices None Family and Social History Unknown History: Yes; Current every day smoker - cigarettes; Marital Status - Married; Alcohol Use: Rarely; Drug Use: No History; Caffeine Use: Moderate; Financial Concerns: No; Food, Clothing or Shelter Needs: No; Support System Lacking: No; Transportation Concerns: No Electronic Signature(s) Signed: 09/13/2021 4:23:48 PM By: Kalman Shan DO Signed: 09/13/2021 4:28:42 PM By: Rhae Hammock RN Entered By: Kalman Shan on 09/13/2021  16:18:38 -------------------------------------------------------------------------------- SuperBill Details Patient Name: Date of Service: Melissa Shepherd, [name] 09/13/2021 Medical Record Number: [id number] Patient Account Number: [id number] Date of Birth/Sex: Treating RN: 03-03-1970 (51 y.o. Tonita Phoenix, Lauren Primary Care Provider: Threasa Alpha Other Clinician: Referring Provider: Treating Provider/Extender: [place] Service in Treatment: 6 Diagnosis Coding ICD-10 Codes Code Description L02.411 Cutaneous abscess of right axilla L02.412 Cutaneous abscess of left axilla B95.62 Methicillin resistant Staphylococcus aureus infection as the cause of diseases classified elsewhere Physician Procedures : CPT4 Code Description Modifier [id number] - WC PHYS LEVEL 3 - EST PT ICD-10 Diagnosis Description L02.411 Cutaneous abscess of right axilla L02.412 Cutaneous abscess of left axilla B95.62 Methicillin resistant Staphylococcus aureus infection as the  cause of diseases classified elsewhere Quantity: 1 Electronic Signature(s) Signed: 09/13/2021 4:23:48 PM By: Kalman Shan DO Entered By: Kalman Shan on 09/13/2021 16:22:04

## 2021-09-13 NOTE — Progress Notes (Addendum)
ARLO, BUFFONE ([id number]) Visit Report for 09/13/2021 Arrival Information Details Patient Name: Date of Service: Melissa Shepherd, Melissa Shepherd. 09/13/2021 3:00 PM Medical Record Number: [id number] Patient Account Number: [id number] Date of Birth/Sex: Treating RN: [birth year] (51 y.o. Tonita Phoenix, Lauren Primary Care Katalaya Beel: Threasa Alpha Other Clinician: Referring Harlis Champoux: Treating Joya Willmott/Extender: [place] Service in Treatment: 6 Visit Information History Since Last Visit Added or deleted any medications: No Patient Arrived: Ambulatory Any new allergies or adverse reactions: No Arrival Time: 15:02 Had a fall or experienced change in No Accompanied By: self activities of daily living that may affect Transfer Assistance: None risk of falls: Patient Identification Verified: Yes Signs or symptoms of abuse/neglect since last visito No Secondary Verification Process Completed: Yes Hospitalized since last visit: No Patient Requires Transmission-Based Precautions: No Implantable device outside of the clinic excluding No Patient Has Alerts: No cellular tissue based products placed in the center since last visit: Has Dressing in Place as Prescribed: Yes Pain Present Now: Yes Electronic Signature(s) Signed: 09/13/2021 4:23:23 PM By: Erenest Blank Entered By: Erenest Blank on 09/13/2021 15:04:13 -------------------------------------------------------------------------------- Clinic Level of Care Assessment Details Patient Name: Date of Service: Melissa Shepherd, [name] 09/13/2021 3:00 PM Medical Record Number: [id number] Patient Account Number: [id number] Date of Birth/Sex: Treating RN: 01/04/1971 (51 y.o. Helene Shoe, Meta.Reding Primary Care Doylene Splinter: Threasa Alpha Other Clinician: Referring Rakiya Krawczyk: Treating Aida Lemaire/Extender: [place] Service in Treatment: 6 Clinic Level of Care Assessment Items TOOL 4 Quantity Score X- 1 0 Use when only an EandM  is performed on FOLLOW-UP visit ASSESSMENTS - Nursing Assessment / Reassessment X- 1 10 Reassessment of Co-morbidities (includes updates in patient status) X- 1 5 Reassessment of Adherence to Treatment Plan ASSESSMENTS - Wound and Skin A ssessment / Reassessment '[]'$  - 0 Simple Wound Assessment / Reassessment - one wound X- 2 5 Complex Wound Assessment / Reassessment - multiple wounds X- 1 10 Dermatologic / Skin Assessment (not related to wound area) ASSESSMENTS - Focused Assessment '[]'$  - 0 Circumferential Edema Measurements - multi extremities '[]'$  - 0 Nutritional Assessment / Counseling / Intervention '[]'$  - 0 Lower Extremity Assessment (monofilament, tuning fork, pulses) '[]'$  - 0 Peripheral Arterial Disease Assessment (using hand held doppler) ASSESSMENTS - Ostomy and/or Continence Assessment and Care '[]'$  - 0 Incontinence Assessment and Management '[]'$  - 0 Ostomy Care Assessment and Management (repouching, etc.) PROCESS - Coordination of Care '[]'$  - 0 Simple Patient / Family Education for ongoing care X- 1 20 Complex (extensive) Patient / Family Education for ongoing care X- 1 10 Staff obtains Programmer, systems, Records, T Results / Process Orders est '[]'$  - 0 Staff telephones HHA, Nursing Homes / Clarify orders / etc '[]'$  - 0 Routine Transfer to another Facility (non-emergent condition) '[]'$  - 0 Routine Hospital Admission (non-emergent condition) '[]'$  - 0 New Admissions / Biomedical engineer / Ordering NPWT Apligraf, etc. , '[]'$  - 0 Emergency Hospital Admission (emergent condition) '[]'$  - 0 Simple Discharge Coordination X- 1 15 Complex (extensive) Discharge Coordination PROCESS - Special Needs '[]'$  - 0 Pediatric / Minor Patient Management '[]'$  - 0 Isolation Patient Management '[]'$  - 0 Hearing / Language / Visual special needs '[]'$  - 0 Assessment of Community assistance (transportation, D/C planning, etc.) '[]'$  - 0 Additional assistance / Altered mentation '[]'$  - 0 Support Surface(s)  Assessment (bed, cushion, seat, etc.) INTERVENTIONS - Wound Cleansing / Measurement '[]'$  - 0 Simple Wound Cleansing - one wound X- 2 5 Complex Wound Cleansing - multiple wounds X-  1 5 Wound Imaging (photographs - any number of wounds) '[]'$  - 0 Wound Tracing (instead of photographs) '[]'$  - 0 Simple Wound Measurement - one wound X- 2 5 Complex Wound Measurement - multiple wounds INTERVENTIONS - Wound Dressings '[]'$  - 0 Small Wound Dressing one or multiple wounds '[]'$  - 0 Medium Wound Dressing one or multiple wounds '[]'$  - 0 Large Wound Dressing one or multiple wounds '[]'$  - 0 Application of Medications - topical '[]'$  - 0 Application of Medications - injection INTERVENTIONS - Miscellaneous '[]'$  - 0 External ear exam '[]'$  - 0 Specimen Collection (cultures, biopsies, blood, body fluids, etc.) '[]'$  - 0 Specimen(s) / Culture(s) sent or taken to Lab for analysis '[]'$  - 0 Patient Transfer (multiple staff / Civil Service fast streamer / Similar devices) '[]'$  - 0 Simple Staple / Suture removal (25 or less) '[]'$  - 0 Complex Staple / Suture removal (26 or more) '[]'$  - 0 Hypo / Hyperglycemic Management (close monitor of Blood Glucose) '[]'$  - 0 Ankle / Brachial Index (ABI) - do not check if billed separately X- 1 5 Vital Signs Has the patient been seen at the hospital within the last three years: Yes Total Score: 110 Level Of Care: New/Established - Level 3 Electronic Signature(s) Signed: 09/22/2021 9:19:17 AM By: Deon Pilling RN, BSN Entered By: Deon Pilling on 09/22/2021 09:17:51 -------------------------------------------------------------------------------- Encounter Discharge Information Details Patient Name: Date of Service: Melissa Shepherd, [name] 09/13/2021 3:00 PM Medical Record Number: [id number] Patient Account Number: [id number] Date of Birth/Sex: Treating RN: 29-Jul-1970 (51 y.o. Helene Shoe, Tammi Klippel Primary Care [place] Rowe: Threasa Alpha Other Clinician: Referring Zeferino Mounts: Treating Cashe Gatt/Extender: [place] Service in Treatment: 6 Encounter Discharge Information Items Discharge Condition: Stable Ambulatory Status: Ambulatory Discharge Destination: Home Transportation: Private Auto Accompanied By: self Schedule Follow-up Appointment: No Clinical Summary of Care: Electronic Signature(s) Signed: 09/22/2021 9:19:17 AM By: Deon Pilling RN, BSN Entered By: Deon Pilling on 09/22/2021 09:18:06 -------------------------------------------------------------------------------- Lower Extremity Assessment Details Patient Name: Date of Service: Melissa Shepherd, [name] 09/13/2021 3:00 PM Medical Record Number: [id number] Patient Account Number: [id number] Date of Birth/Sex: Treating RN: February 24, 1971 (51 y.o. Tonita Phoenix, Lauren Primary Care Isa Kohlenberg: Threasa Alpha Other Clinician: Referring Toluwanimi Radebaugh: Treating Nico Rogness/Extender: Ciro Backer Weeks in Treatment: 6 Electronic Signature(s) Signed: 09/13/2021 4:28:42 PM By: Rhae Hammock RN Entered By: Rhae Hammock on 09/13/2021 15:12:02 -------------------------------------------------------------------------------- Multi Wound Chart Details Patient Name: Date of Service: Melissa Shepherd, [name] 09/13/2021 3:00 PM Medical Record Number: [id number] Patient Account Number: [id number] Date of Birth/Sex: Treating RN: 15-Mar-1970 (51 y.o. Tonita Phoenix, Lauren Primary Care Jonell Krontz: Threasa Alpha Other Clinician: Referring Laiba Fuerte: Treating Koraline Phillipson/Extender: Ciro Backer Weeks in Treatment: 6 Vital Signs Height(in): 56 Pulse(bpm): 65 Weight(lbs): 180 Blood Pressure(mmHg): 112/77 Body Mass Index(BMI): 29 Temperature(F): 98.4 Respiratory Rate(breaths/min): 17 Photos: [N/A:N/A] Right Axilla Left Axilla N/A Wound Location: Pimple Pimple N/A Wounding Event: Abscess Abscess N/A Primary Etiology: Lymphedema, Hypertension, Colitis Lymphedema, Hypertension, Colitis N/A Comorbid  History: 08/31/2021 08/31/2021 N/A Date Acquired: 0 0 N/A Weeks of Treatment: Open Open N/A Wound Status: No No N/A Wound Recurrence: 0x0x0 0x0x0 N/A Measurements L x W x D (cm) 0 0 N/A A (cm) : rea 0 0 N/A Volume (cm) : 100.00% 100.00% N/A % Reduction in A rea: 100.00% 100.00% N/A % Reduction in Volume: Full Thickness Without Exposed Full Thickness Without Exposed N/A Classification: Support Structures Support Structures Medium Medium N/A Exudate Amount: Purulent Purulent N/A Exudate Type: yellow, brown, green yellow, brown, green  N/A Exudate Color: Distinct, outline attached Distinct, outline attached N/A Wound Margin: Small (1-33%) Small (1-33%) N/A Granulation Amount: Red Red, Pink N/A Granulation Quality: Large (67-100%) Large (67-100%) N/A Necrotic Amount: Fascia: No Fat Layer (Subcutaneous Tissue): Yes N/A Exposed Structures: Fat Layer (Subcutaneous Tissue): No Fascia: No Tendon: No Tendon: No Muscle: No Muscle: No Joint: No Joint: No Bone: No Bone: No None None N/A Epithelialization: Treatment Notes Electronic Signature(s) Signed: 09/13/2021 4:23:48 PM By: Kalman Shan DO Signed: 09/13/2021 4:28:42 PM By: Rhae Hammock RN Entered By: Kalman Shan on 09/13/2021 16:15:43 -------------------------------------------------------------------------------- Multi-Disciplinary Care Plan Details Patient Name: Date of Service: Melissa Shepherd, [name] 09/13/2021 3:00 PM Medical Record Number: [id number] Patient Account Number: [id number] Date of Birth/Sex: Treating RN: 22-Jul-1970 (51 y.o. Debby Bud Primary Care Marzelle Rutten: Threasa Alpha Other Clinician: Referring Meha Vidrine: Treating Nawaf Strange/Extender: [place] Service in Treatment: 6 Active Inactive Electronic Signature(s) Signed: 09/22/2021 9:17:18 AM By: Deon Pilling RN, BSN Entered By: Deon Pilling on 09/22/2021  09:17:18 -------------------------------------------------------------------------------- Pain Assessment Details Patient Name: Date of Service: Melissa Shepherd, [name] 09/13/2021 3:00 PM Medical Record Number: [id number] Patient Account Number: [id number] Date of Birth/Sex: Treating RN: 24-May-1970 (51 y.o. Tonita Phoenix, Lauren Primary Care Ronnae Kaser: Threasa Alpha Other Clinician: Referring Shamicka Inga: Treating Ladan Vanderzanden/Extender: [place] Service in Treatment: 6 Active Problems Location of Pain Severity and Description of Pain Patient Has Paino Yes Site Locations Rate the pain. Current Pain Level: 4 Pain Management and Medication Current Pain Management: Electronic Signature(s) Signed: 09/13/2021 4:23:23 PM By: Erenest Blank Signed: 09/13/2021 4:28:42 PM By: Rhae Hammock RN Entered By: Erenest Blank on 09/13/2021 15:07:31 -------------------------------------------------------------------------------- Wound Assessment Details Patient Name: Date of Service: Melissa Shepherd, [name] 09/13/2021 3:00 PM Medical Record Number: [id number] Patient Account Number: [id number] Date of Birth/Sex: Treating RN: 01-27-1971 (51 y.o. Tonita Phoenix, Lauren Primary Care Divina Neale: Threasa Alpha Other Clinician: Referring Tae Vonada: Treating Chantae Soo/Extender: Ciro Backer Weeks in Treatment: 6 Wound Status Wound Number: 1 Primary Etiology: Abscess Wound Location: Right Axilla Wound Status: Open Wounding Event: Pimple Comorbid History: Lymphedema, Hypertension, Colitis Date Acquired: 08/31/2021 Weeks Of Treatment: 0 Clustered Wound: No Photos Wound Measurements Length: (cm) Width: (cm) Depth: (cm) Area: (cm) Volume: (cm) 0 % Reduction in Area: 100% 0 % Reduction in Volume: 100% 0 Epithelialization: None 0 0 Wound Description Classification: Full Thickness Without Exposed Support Structures Wound Margin: Distinct, outline attached Exudate  Amount: Medium Exudate Type: Purulent Exudate Color: yellow, brown, green Foul Odor After Cleansing: No Slough/Fibrino No Wound Bed Granulation Amount: Small (1-33%) Exposed Structure Granulation Quality: Red Fascia Exposed: No Necrotic Amount: Large (67-100%) Fat Layer (Subcutaneous Tissue) Exposed: No Necrotic Quality: Adherent Slough Tendon Exposed: No Muscle Exposed: No Joint Exposed: No Bone Exposed: No Electronic Signature(s) Signed: 09/13/2021 4:28:42 PM By: Rhae Hammock RN Entered By: Rhae Hammock on 09/13/2021 15:13:07 -------------------------------------------------------------------------------- Wound Assessment Details Patient Name: Date of Service: Melissa Shepherd, [name] 09/13/2021 3:00 PM Medical Record Number: [id number] Patient Account Number: [id number] Date of Birth/Sex: Treating RN: June 06, 1970 (51 y.o. Tonita Phoenix, Lauren Primary Care Charmion Hapke: Threasa Alpha Other Clinician: Referring Alejah Aristizabal: Treating Mclain Freer/Extender: Ciro Backer Weeks in Treatment: 6 Wound Status Wound Number: 2 Primary Etiology: Abscess Wound Location: Left Axilla Wound Status: Open Wounding Event: Pimple Comorbid History: Lymphedema, Hypertension, Colitis Date Acquired: 08/31/2021 Weeks Of Treatment: 0 Clustered Wound: No Photos Wound Measurements Length: (cm) Width: (cm) Depth: (cm) Area: (cm) Volume: (cm) 0 % Reduction in Area:  100% 0 % Reduction in Volume: 100% 0 Epithelialization: None 0 0 Wound Description Classification: Full Thickness Without Exposed Support Structures Wound Margin: Distinct, outline attached Exudate Amount: Medium Exudate Type: Purulent Exudate Color: yellow, brown, green Foul Odor After Cleansing: No Slough/Fibrino Yes Wound Bed Granulation Amount: Small (1-33%) Exposed Structure Granulation Quality: Red, Pink Fascia Exposed: No Necrotic Amount: Large (67-100%) Fat Layer (Subcutaneous Tissue) Exposed:  Yes Necrotic Quality: Adherent Slough Tendon Exposed: No Muscle Exposed: No Joint Exposed: No Bone Exposed: No Electronic Signature(s) Signed: 09/13/2021 4:28:42 PM By: Rhae Hammock RN Entered By: Rhae Hammock on 09/13/2021 15:13:25 -------------------------------------------------------------------------------- Vitals Details Patient Name: Date of Service: Melissa Shepherd, [name] 09/13/2021 3:00 PM Medical Record Number: [id number] Patient Account Number: [id number] Date of Birth/Sex: Treating RN: [birth year] (50 y.o. Tonita Phoenix, Lauren Primary Care Lorenza Shakir: Threasa Alpha Other Clinician: Referring Aisia Correira: Treating Prisha Hiley/Extender: [place] Service in Treatment: 6 Vital Signs Time Taken: 15:04 Temperature (F): 98.4 Height (in): 66 Pulse (bpm): 65 Weight (lbs): 180 Respiratory Rate (breaths/min): 17 Body Mass Index (BMI): 29 Blood Pressure (mmHg): 112/77 Reference Range: 80 - 120 mg / dl Electronic Signature(s) Signed: 09/13/2021 4:23:23 PM By: Erenest Blank Entered By: Erenest Blank on 09/13/2021 15:07:24

## 2021-09-19 ENCOUNTER — Inpatient Hospital Stay: Payer: 59 | Attending: Hematology

## 2021-09-19 VITALS — BP 127/84 | HR 65 | Temp 98.4°F | Resp 20

## 2021-09-19 DIAGNOSIS — Z170 Estrogen receptor positive status [ER+]: Secondary | ICD-10-CM | POA: Diagnosis not present

## 2021-09-19 DIAGNOSIS — C50111 Malignant neoplasm of central portion of right female breast: Secondary | ICD-10-CM | POA: Diagnosis present

## 2021-09-19 MED ORDER — LEUPROLIDE ACETATE 3.75 MG IM KIT
3.7500 mg | PACK | Freq: Once | INTRAMUSCULAR | Status: AC
  Administered 2021-09-19: 3.75 mg via INTRAMUSCULAR
  Filled 2021-09-19: qty 3.75

## 2021-09-20 ENCOUNTER — Encounter (INDEPENDENT_AMBULATORY_CARE_PROVIDER_SITE_OTHER): Payer: Self-pay | Admitting: *Deleted

## 2021-09-27 ENCOUNTER — Other Ambulatory Visit: Payer: Self-pay | Admitting: Hematology and Oncology

## 2021-10-11 ENCOUNTER — Ambulatory Visit
Payer: 59 | Attending: Student in an Organized Health Care Education/Training Program | Admitting: Student in an Organized Health Care Education/Training Program

## 2021-10-11 ENCOUNTER — Encounter: Payer: Self-pay | Admitting: Hematology

## 2021-10-11 ENCOUNTER — Encounter: Payer: Self-pay | Admitting: Student in an Organized Health Care Education/Training Program

## 2021-10-11 VITALS — BP 120/76 | HR 62 | Temp 96.9°F | Resp 16 | Ht 65.0 in | Wt 190.0 lb

## 2021-10-11 DIAGNOSIS — F17200 Nicotine dependence, unspecified, uncomplicated: Secondary | ICD-10-CM | POA: Diagnosis not present

## 2021-10-11 DIAGNOSIS — G8929 Other chronic pain: Secondary | ICD-10-CM

## 2021-10-11 DIAGNOSIS — M5136 Other intervertebral disc degeneration, lumbar region: Secondary | ICD-10-CM | POA: Diagnosis not present

## 2021-10-11 DIAGNOSIS — M5416 Radiculopathy, lumbar region: Secondary | ICD-10-CM

## 2021-10-11 DIAGNOSIS — G894 Chronic pain syndrome: Secondary | ICD-10-CM | POA: Diagnosis present

## 2021-10-11 DIAGNOSIS — M47816 Spondylosis without myelopathy or radiculopathy, lumbar region: Secondary | ICD-10-CM | POA: Diagnosis not present

## 2021-10-11 DIAGNOSIS — M51369 Other intervertebral disc degeneration, lumbar region without mention of lumbar back pain or lower extremity pain: Secondary | ICD-10-CM

## 2021-10-11 NOTE — Patient Instructions (Addendum)
I need a copy of your previous injection history including what injections were performed and what steroid was used. I also need a copy of your previous imaging records    It may be better for you to contact [facility] to see if you can get in for a spinal injection since you have been established with them.  If you would like to TRANSFER your INTERVENTIONAL pain care here, which will only include injections every 3-4 months, we need to review your records before hand.

## 2021-10-11 NOTE — Progress Notes (Signed)
Patient: Melissa Shepherd  Service Category: E/M  Provider: Gillis Santa, MD  DOB: 03-25-1970  DOS: 10/11/2021  Referring Provider: Katha Cabal, MD  MRN: [id number]  Setting: Ambulatory outpatient  PCP: Remi Haggard, FNP  Type: New Patient  Specialty: Interventional Pain Management    Location: Office  Delivery: Face-to-face     Primary Reason(s) for Visit: Encounter for initial evaluation of one or more chronic problems (new to examiner) potentially causing chronic pain, and posing a threat to normal musculoskeletal function. (Level of risk: High) CC: Back Pain (lower) and Leg Pain (left)  HPI  Melissa Shepherd is a 51 y.o. year old, female patient, who comes for the first time to our practice referred by Delana Meyer, Dolores Lory, MD for our initial evaluation of her chronic pain. She has Essential hypertension; Depression; Tobacco use disorder; Chest pain; Leg swelling; Lumbago; Bilateral thoracic back pain; IBS (irritable bowel syndrome); Anxiety; Mixed hyperlipidemia; Vitamin D deficiency; Cholelithiasis with acute on chronic cholecystitis without biliary obstruction; Malignant neoplasm of central portion of right breast in female, estrogen receptor positive ([place]); Cellulitis and abscess of left lower extremity; Pain and swelling of lower leg; Abdominal wall cellulitis; Open abdominal wall wound; Hypokalemia; Lesion of left native kidney; Cellulitis; Lymphedema; Chronic radicular lumbar pain; Lumbar facet arthropathy; Lumbar degenerative disc disease; and Chronic pain syndrome on their problem list. Today she comes in for evaluation of her Back Pain (lower) and Leg Pain (left)  Pain Assessment: Location: Left, Upper, Lower Leg Radiating:   Onset: More than a month ago Duration: Chronic pain Quality: Aching, Throbbing Severity: 2 /10 (subjective, self-reported pain score)  Effect on ADL: difficulty performing daily activities Timing: Constant Modifying factors: nothing BP: 120/76  HR: 62  Onset  and Duration: Sudden Cause of pain: Unknown Severity: Getting worse, NAS-11 at its worse: 10/10, NAS-11 at its best: 02/10, NAS-11 now: 02/10, and NAS-11 on the average: 10/10 Timing: Not influenced by the time of the day Aggravating Factors: Bending, Climbing, Kneeling, Lifiting, Prolonged sitting, Prolonged standing, Squatting, Stooping , and Working Alleviating Factors: Medications Associated Problems: Fatigue, Inability to control bowel, Numbness, Sadness, Sweating, Swelling, Tingling, Pain that wakes patient up, and Pain that does not allow patient to sleep Quality of Pain: Aching, Deep, Disabling, Dreadful, Horrible, Punishing, Shooting, Stabbing, Throbbing, Tingling, and Uncomfortable  Melissa Shepherd is a 51 year old female who presents with multiple pain complaints but her primary pain generator being her low back pain with radiation into her bilateral legs in a dermatomal fashion.  She states that she was previously managed by [facility] but due to a lapse in insurance she had to transfer her care.  She states that she currently has insurance that is being accepted by [employer].  She states that her last spinal injection was in December.  She is hoping to have this repeated.  She has a history of lumbar degenerative disc disease and neuroforaminal stenosis.  She states that the injections do help her out for a couple of months.  She states that she would get them every 3 to 4 months.  She has tried to contact [facility] for an appointment but has been unsuccessful at reaching them.  I informed her that we will focus primarily on interventional pain therapy.  She would not be a candidate for chronic opioid analgesics.  She continues to smoke and I counseled her on smoking cessation and how that could contribute to increased chronic pain.  She has chronic anxiety and is on high-dose benzodiazepine therapy with  Xanax 1 mg 3 times a day.  I informed her that this is another reason that opioid medication  should not be considered as combination therapy with opioids and benzodiazepines can increase risk of adverse cardio respiratory events.  I informed the patient that she will need to sign a release of records, we will need to get all of her medical records from [facility] as they pertain to her low back history particularly her previous lumbar epidural steroid injection and any pertinent imaging records.  I also recommend that she consider physical therapy at [facility].  Meds   Current Outpatient Medications:    ALPRAZolam (XANAX) 1 MG tablet, Take 1 mg by mouth 3 (three) times daily., Disp: , Rfl:    amLODipine (NORVASC) 10 MG tablet, Take 10 mg by mouth daily., Disp: , Rfl:    amphetamine-dextroamphetamine (ADDERALL) 20 MG tablet, Take 20 mg by mouth 2 (two) times daily., Disp: , Rfl:    ascorbic acid (VITAMIN C) 250 MG CHEW, Chew 500 mg by mouth at bedtime., Disp: , Rfl:    aspirin 81 MG EC tablet, Take 1 tablet (81 mg total) by mouth daily. Swallow whole., Disp: 30 tablet, Rfl: 11   CALCIUM PO, Take 600 mg by mouth at bedtime., Disp: , Rfl:    Chlorhexidine Gluconate Cloth 2 % PADS, Apply 6 each topically daily at 6 (six) AM. Use 1 cloth per appendage. Then use the remaining clothes for your torso. Use daily x 5 days. Regardless of time of day, Disp: 30 each, Rfl: 0   Cholecalciferol (VITAMIN D-3) 125 MCG (5000 UT) TABS, Take 2 tablets by mouth at bedtime., Disp: , Rfl:    colestipol (COLESTID) 1 g tablet, Take 1-2 g by mouth 2 (two) times daily., Disp: , Rfl:    exemestane (AROMASIN) 25 MG tablet, TAKE 1 TABLET (25 MG TOTAL) BY MOUTH DAILY., Disp: 30 tablet, Rfl: 0   folic acid (FOLVITE) 1 MG tablet, Take 1 mg by mouth at bedtime., Disp: , Rfl:    furosemide (LASIX) 20 MG tablet, Take 20 mg by mouth daily., Disp: , Rfl:    gabapentin (NEURONTIN) 300 MG capsule, Take 300 mg by mouth 3 (three) times daily., Disp: , Rfl:    hydrochlorothiazide (HYDRODIURIL) 12.5 MG tablet, TAKE 1 TABLET BY  MOUTH DAILY. (Patient taking differently: Take 12.5 mg by mouth daily.), Disp: 30 tablet, Rfl: 7   Iron-Vitamins (GERITOL PO), Take 1 tablet by mouth daily., Disp: , Rfl:    metoprolol succinate (TOPROL-XL) 100 MG 24 hr tablet, Take 100 mg by mouth daily., Disp: , Rfl:    mupirocin ointment (BACTROBAN) 2 %, Apply 1 application. topically 3 (three) times daily. As directed for 5 days, Disp: 22 g, Rfl: 0   nystatin (MYCOSTATIN) 100000 UNIT/ML suspension, Use as directed 5 mLs (500,000 Units total) in the mouth or throat 4 (four) times daily as needed (Oral irritation or Oral thrush)., Disp: 180 mL, Rfl: 0   potassium chloride SA (KLOR-CON M) 20 MEQ tablet, Take 20 mEq by mouth at bedtime., Disp: , Rfl:    rosuvastatin (CRESTOR) 40 MG tablet, Take 40 mg by mouth at bedtime., Disp: , Rfl:    sertraline (ZOLOFT) 100 MG tablet, Take 100 mg by mouth at bedtime., Disp: , Rfl:    traMADol (ULTRAM) 50 MG tablet, Take 50-100 mg by mouth every 8 (eight) hours as needed., Disp: , Rfl:    doxycycline (VIBRA-TABS) 100 MG tablet, Take 1 tablet (100 mg total) by  mouth 2 (two) times daily. Take on full stomach to minimize nausea (Patient not taking: Reported on 10/11/2021), Disp: 14 tablet, Rfl: 0   meloxicam (MOBIC) 15 MG tablet, Take 15 mg by mouth daily., Disp: , Rfl:    methocarbamol (ROBAXIN) 500 MG tablet, Take 500 mg by mouth 3 (three) times daily., Disp: , Rfl:    oxyCODONE-acetaminophen (PERCOCET/ROXICET) 5-325 MG tablet, Take 1 tablet by mouth every 4 (four) hours as needed for moderate pain. (Patient not taking: Reported on 10/11/2021), Disp: 20 tablet, Rfl: 0  Imaging Review  Narrative CLINICAL DATA:  Bilateral thoracic back pain M54.6 (ICD-10-CM) Pain around thoracic 11-12, paraspinal muscle tenderness; Low back pain w/ no sciatica  EXAM: THORACIC SPINE 2 VIEWS  COMPARISON:  None.  FINDINGS: No fracture or bone lesion. No spondylolisthesis. Minor endplate spurring along the mid thoracic spine. No  other degenerative change. Soft tissues are unremarkable.  IMPRESSION: Minimal endplate spurring.  Otherwise negative.   Electronically Signed By: Lajean Manes M.D. On: 10/24/2014 09:49 CLINICAL DATA:  Low back pain without sciatica. Lower thoracic pain/paraspinal muscle tenderness at approximately T11-12.  EXAM: LUMBAR SPINE - 2-3 VIEW  COMPARISON:  None.  FINDINGS: There is mild left convex curvature of the upper lumbar spine which may be partially positional. There are 5 non rib-bearing lumbar type vertebral bodies. Anteroposterior alignment is within normal limits. Lumbar vertebral body heights are preserved without evidence of compression fracture. Intervertebral disc space heights are preserved. Mild endplate spurring is present in the lower thoracic and upper lumbar spine. No lytic or blastic osseous lesion or soft tissue abnormality is identified.  IMPRESSION: Very mild thoracolumbar spondylosis.   Electronically Signed By: Logan Bores M.D. On: 10/24/2014 09:52 Complexity Note: Imaging results reviewed.                         ROS  Cardiovascular: Daily Aspirin intake, High blood pressure, and Chest pain Pulmonary or Respiratory: Smoking and Temporary stoppage of breathing during sleep Neurological: No reported neurological signs or symptoms such as seizures, abnormal skin sensations, urinary and/or fecal incontinence, being born with an abnormal open spine and/or a tethered spinal cord Psychological-Psychiatric: Anxiousness, Depressed, and Prone to panicking Gastrointestinal: Alternating episodes iof diarrhea and constipation (IBS-Irritable bowe syndrome) Genitourinary: No reported renal or genitourinary signs or symptoms such as difficulty voiding or producing urine, peeing blood, non-functioning kidney, kidney stones, difficulty emptying the bladder, difficulty controlling the flow of urine, or chronic kidney disease Hematological: No reported hematological  signs or symptoms such as prolonged bleeding, low or poor functioning platelets, bruising or bleeding easily, hereditary bleeding problems, low energy levels due to low hemoglobin or being anemic Endocrine: No reported endocrine signs or symptoms such as high or low blood sugar, rapid heart rate due to high thyroid levels, obesity or weight gain due to slow thyroid or thyroid disease Rheumatologic: No reported rheumatological signs and symptoms such as fatigue, joint pain, tenderness, swelling, redness, heat, stiffness, decreased range of motion, with or without associated rash Musculoskeletal: Negative for myasthenia gravis, muscular dystrophy, multiple sclerosis or malignant hyperthermia Work History: Quit going to work on his/her own  Allergies  Ms. Lober is allergic to ezetimibe and prednisone.  Laboratory Chemistry Profile   Renal Lab Results  Component Value Date   BUN 10 06/20/2021   CREATININE 0.72 06/20/2021   GFR 96.30 04/12/2016   GFRAA >60 05/17/2017   GFRNONAA >60 06/20/2021   PROTEINUR NEGATIVE 03/31/2021  Electrolytes Lab Results  Component Value Date   NA 143 06/20/2021   K 3.5 06/20/2021   CL 111 06/20/2021   CALCIUM 9.1 06/20/2021   MG 1.8 06/19/2021     Hepatic Lab Results  Component Value Date   AST 19 06/20/2021   ALT 16 06/20/2021   ALBUMIN 3.5 06/20/2021   ALKPHOS 44 06/20/2021   LIPASE 30 05/10/2017     ID Lab Results  Component Value Date   HIV Non Reactive 04/01/2021   SARSCOV2NAA NEGATIVE 03/31/2021     Bone Lab Results  Component Value Date   VD25OH 31.96 08/18/2015     Endocrine Lab Results  Component Value Date   GLUCOSE 95 06/20/2021   GLUCOSEU NEGATIVE 03/31/2021   HGBA1C 5.4 04/26/2015   TSH 0.76 04/18/2016     Neuropathy Lab Results  Component Value Date   HGBA1C 5.4 04/26/2015   HIV Non Reactive 04/01/2021     CNS No results found for: "COLORCSF", "APPEARCSF", "RBCCOUNTCSF", "WBCCSF", "POLYSCSF", "LYMPHSCSF",  "EOSCSF", "PROTEINCSF", "GLUCCSF", "JCVIRUS", "CSFOLI", "IGGCSF", "LABACHR", "ACETBL"   Inflammation (CRP: Acute  ESR: Chronic) Lab Results  Component Value Date   CRP 0.5 06/19/2021   ESRSEDRATE 3 06/20/2021     Rheumatology No results found for: "RF", "ANA", "LABURIC", "URICUR", "LYMEIGGIGMAB", "LYMEABIGMQN", "HLAB27"   Coagulation Lab Results  Component Value Date   INR 0.9 06/21/2021   LABPROT 12.3 06/21/2021   PLT 179 06/20/2021     Cardiovascular Lab Results  Component Value Date   BNP 57.0 04/03/2016   TROPONINI <0.03 05/17/2017   HGB 13.2 06/20/2021   HCT 39.0 06/20/2021     Screening Lab Results  Component Value Date   SARSCOV2NAA NEGATIVE 03/31/2021   HIV Non Reactive 04/01/2021     Cancer No results found for: "CEA", "CA125", "LABCA2"   Allergens No results found for: "ALMOND", "APPLE", "ASPARAGUS", "AVOCADO", "BANANA", "BARLEY", "BASIL", "BAYLEAF", "GREENBEAN", "LIMABEAN", "WHITEBEAN", "BEEFIGE", "REDBEET", "BLUEBERRY", "BROCCOLI", "CABBAGE", "MELON", "CARROT", "CASEIN", "CASHEWNUT", "CAULIFLOWER", "CELERY"     Note: Lab results reviewed.  [place]  Drug: Ms. Estis  reports no history of drug use. Alcohol:  reports that she does not currently use alcohol after a past usage of about 2.0 standard drinks of alcohol per week. Tobacco:  reports that she has been smoking cigarettes. She has a 7.75 pack-year smoking history. She has never used smokeless tobacco. Medical:  has a past medical history of Anginal pain ([place]), Anxiety, Cancer ([place]), Chicken pox, Cholelithiasis with acute on chronic cholecystitis without biliary obstruction (11/26/2016), Depression, Diarrhea, Dyspnea, Fatty liver, Frequent headaches, Hypertension, IBS (irritable bowel syndrome), Multiple thyroid nodules, and Seizures ([place]). Family: family history includes Breast cancer in her maternal grandmother and paternal aunt; Cancer in her maternal grandmother and sister; Hypertension in her  mother.  Past Surgical History:  Procedure Laterality Date   CESAREAN SECTION  1991. 2003.   2   CHOLECYSTECTOMY N/A 11/26/2016   Procedure: LAPAROSCOPIC CHOLECYSTECTOMY WITH INTRAOPERATIVE CHOLANGIOGRAM;  Surgeon: Fanny Skates, MD;  Location: [facility];  Service: General;  Laterality: N/A;   MASTECTOMY Right    WISDOM TOOTH EXTRACTION     Active Ambulatory Problems    Diagnosis Date Noted   Essential hypertension 06/11/2014   Depression 06/11/2014   Tobacco use disorder 06/11/2014   Chest pain 07/09/2014   Leg swelling 07/09/2014   Lumbago 10/06/2014   Bilateral thoracic back pain 10/06/2014   IBS (irritable bowel syndrome) 05/01/2015   Anxiety 08/18/2015   Mixed hyperlipidemia 08/18/2015   Vitamin  D deficiency 08/18/2015   Cholelithiasis with acute on chronic cholecystitis without biliary obstruction 11/26/2016   Malignant neoplasm of central portion of right breast in female, estrogen receptor positive ([place]) 03/06/2021   Cellulitis and abscess of left lower extremity 03/31/2021   Pain and swelling of lower leg 04/25/2021   Abdominal wall cellulitis 06/18/2021   Open abdominal wall wound 06/19/2021   Hypokalemia 06/19/2021   Lesion of left native kidney 06/19/2021   Cellulitis 06/20/2021   Lymphedema 06/27/2021   Chronic radicular lumbar pain 10/11/2021   Lumbar facet arthropathy 10/11/2021   Lumbar degenerative disc disease 10/11/2021   Chronic pain syndrome 10/11/2021   Resolved Ambulatory Problems    Diagnosis Date Noted   No Resolved Ambulatory Problems   Past Medical History:  Diagnosis Date   Anginal pain ([place])    Cancer ([place])    Chicken pox    Diarrhea    Dyspnea    Fatty liver    Frequent headaches    Hypertension    Multiple thyroid nodules    Seizures ([place])    Constitutional Exam  General appearance: Well nourished, well developed, and well hydrated. In no apparent acute distress Vitals:   10/11/21 1313  BP: 120/76  Pulse: 62  Resp: 16  Temp:  (!) 96.9 F (36.1 C)  TempSrc: Temporal  SpO2: 100%  Weight: 190 lb (86.2 kg)  Height: 5' 5"  (1.651 m)   BMI Assessment: Estimated body mass index is 31.62 kg/m as calculated from the following:   Height as of this encounter: 5' 5"  (1.651 m).   Weight as of this encounter: 190 lb (86.2 kg).  BMI interpretation table: BMI level Category Range association with higher incidence of chronic pain  <18 kg/m2 Underweight   18.5-24.9 kg/m2 Ideal body weight   25-29.9 kg/m2 Overweight Increased incidence by 20%  30-34.9 kg/m2 Obese (Class I) Increased incidence by 68%  35-39.9 kg/m2 Severe obesity (Class II) Increased incidence by 136%  >40 kg/m2 Extreme obesity (Class III) Increased incidence by 254%   Patient's current BMI Ideal Body weight  Body mass index is 31.62 kg/m. Ideal body weight: 57 kg (125 lb 10.6 oz) Adjusted ideal body weight: 68.7 kg (151 lb 6.4 oz)   BMI Readings from Last 4 Encounters:  10/11/21 31.62 kg/m  08/02/21 29.76 kg/m  07/25/21 29.07 kg/m  07/05/21 30.47 kg/m   Wt Readings from Last 4 Encounters:  10/11/21 190 lb (86.2 kg)  08/02/21 184 lb 6.4 oz (83.6 kg)  07/25/21 180 lb 1.6 oz (81.7 kg)  07/05/21 188 lb 12.8 oz (85.6 kg)    Psych/Mental status: Alert, oriented x 3 (person, place, & time)       Eyes: PERLA Respiratory: No evidence of acute respiratory distress  Back Exam   Tenderness  The patient is experiencing tenderness in the lumbar.  Range of Motion  Extension:  abnormal Back extension: limited by pain. Flexion:  abnormal Back flexion: limited by pain.  Muscle Strength  Right Quadriceps:  5/5  Left Quadriceps:  5/5  Right Hamstrings:  5/5  Left Hamstrings:  5/5   Other  Gait: normal   Comments:  L4/5 radicular pain pattern       Assessment  Primary Diagnosis & Pertinent Problem List: The primary encounter diagnosis was Chronic radicular lumbar pain. Diagnoses of Lumbar facet arthropathy, Lumbar degenerative disc  disease, Tobacco use disorder, and Chronic pain syndrome were also pertinent to this visit.  Visit Diagnosis (New problems to examiner): 1. Chronic  radicular lumbar pain   2. Lumbar facet arthropathy   3. Lumbar degenerative disc disease   4. Tobacco use disorder   5. Chronic pain syndrome    Plan of Care (Initial workup plan)  Patient is high risk for opioid prescribing. The patient will not be a candidate for opioid therapy through this clinic. Patient will be optimized on non-opioid adjunctive analgesics and a comprehensive pain management plan was developed.   1.  Consider physical therapy.  Offered referral 2.  Avoid opioid analgesics.  Consider alternative to Xanax for management of anxiety 3.  I instructed patient that she will need to sign release of records so that we can obtain medical records from [facility] to better understand previous therapies as well as diagnostic studies that were performed.  Only after review of records will I be able to evaluate Riniyah's case and determine if she will be a candidate for any interventional treatment options at this clinic.  She has found benefit with lumbar epidural steroid injections in the past which she would get done every 3 to 6 months at [facility].  I encouraged her to reach back out to them for follow-up at this time. 4. Smoking cessation instruction/counseling given:  counseled patient on the dangers of tobacco use, advised patient to stop smoking, and reviewed strategies to maximize success   Provider-requested follow-up: No follow-ups on file.  I spent a total of 60 minutes reviewing chart data, face-to-face evaluation with the patient, counseling and coordination of care as detailed above.   Future Appointments  Date Time Provider [place]  10/18/2021  2:00 PM Benay Pike, MD [facility] None  10/18/2021  2:45 PM [facility] None  10/22/2021  2:20 PM Gertie Gowda, DO [facility] CPR  11/19/2021   2:50 PM [facility] [place] [facility]  01/08/2022  3:00 PM Franchot Gallo, MD [facility] None    Note by: Gillis Santa, MD Date: 10/11/2021; Time: 2:37 PM

## 2021-10-12 ENCOUNTER — Encounter: Payer: Self-pay | Admitting: Hematology

## 2021-10-15 ENCOUNTER — Encounter: Payer: Self-pay | Admitting: Hematology

## 2021-10-18 ENCOUNTER — Inpatient Hospital Stay: Payer: 59

## 2021-10-18 ENCOUNTER — Inpatient Hospital Stay: Payer: 59 | Attending: Hematology | Admitting: Hematology and Oncology

## 2021-10-18 ENCOUNTER — Other Ambulatory Visit: Payer: Self-pay

## 2021-10-18 VITALS — BP 97/75 | HR 67 | Temp 97.7°F | Resp 16

## 2021-10-18 DIAGNOSIS — Z170 Estrogen receptor positive status [ER+]: Secondary | ICD-10-CM | POA: Diagnosis not present

## 2021-10-18 DIAGNOSIS — C50111 Malignant neoplasm of central portion of right female breast: Secondary | ICD-10-CM | POA: Insufficient documentation

## 2021-10-18 MED ORDER — LEUPROLIDE ACETATE 3.75 MG IM KIT
3.7500 mg | PACK | Freq: Once | INTRAMUSCULAR | Status: AC
  Administered 2021-10-18: 3.75 mg via INTRAMUSCULAR
  Filled 2021-10-18: qty 3.75

## 2021-10-18 NOTE — Patient Instructions (Signed)
Leuprolide Solution for Injection What is this medication? LEUPROLIDE (loo PROE lide) reduces the symptoms of prostate cancer. It works by decreasing levels of the hormone testosterone in the body. This prevents prostate cancer cells from spreading or growing. This medicine may be used for other purposes; ask your health care provider or pharmacist if you have questions. COMMON BRAND NAME(S): Lupron What should I tell my care team before I take this medication? They need to know if you have any of these conditions: Diabetes Heart attack Heart disease High blood pressure High cholesterol Pain or difficulty passing urine Spinal cord metastasis Stroke Tobacco use An unusual or allergic reaction to leuprolide, other medications, foods, dyes, or preservatives Pregnant or trying to get pregnant Breast-feeding How should I use this medication? This medication is for injection under the skin or into a muscle. You will be taught how to prepare and give this medication. Use exactly as directed. Take your medication at regular intervals. Do not take it more often than directed. It is important that you put your used needles and syringes in a special sharps container. Do not put them in a trash can. If you do not have a sharps container, call your care team to get one. A special MedGuide will be given to you by the pharmacist with each prescription and refill. Be sure to read this information carefully each time. Talk to your care team about the use of this medication in children. While this medication may be prescribed for children as young as 8 years for selected conditions, precautions do apply. Overdosage: If you think you have taken too much of this medicine contact a poison control center or emergency room at once. NOTE: This medicine is only for you. Do not share this medicine with others. What if I miss a dose? If you miss a dose, take it as soon as you can. If it is almost time for your next  dose, take only that dose. Do not take double or extra doses. What may interact with this medication? Do not take this medication with any of the following: Chasteberry Cisapride Dronedarone Pimozide Thioridazine This medication may also interact with the following: Estrogen or progestin hormones Herbal or dietary supplements, like black cohosh or DHEA Other medications that cause heart rhythm changes Testosterone This list may not describe all possible interactions. Give your health care provider a list of all the medicines, herbs, non-prescription drugs, or dietary supplements you use. Also tell them if you smoke, drink alcohol, or use illegal drugs. Some items may interact with your medicine. What should I watch for while using this medication? Visit your care team for regular checks on your progress. During the first week, your symptoms may get worse, but then will improve as you continue your treatment. You may get hot flashes, increased bone pain, increased difficulty passing urine, or an aggravation of nerve symptoms. Discuss these effects with your care team, some of them may improve with continued use of this medication. Patients may experience a menstrual cycle or spotting during the first 2 months of therapy with this medication. If this continues, contact your care team. This medication may increase blood sugar. The risk may be higher in patients who already have diabetes. Ask your care team what you can do to lower your risk of diabetes while taking this medication. What side effects may I notice from receiving this medication? Side effects that you should report to your care team as soon as possible: Allergic reactions--skin rash,  itching, hives, swelling of the face, lips, tongue, or throat Heart attack--pain or tightness in the chest, shoulders, arms, or jaw, nausea, shortness of breath, cold or clammy skin, feeling faint or lightheaded Heart rhythm changes--fast or irregular  heartbeat, dizziness, feeling faint or lightheaded, chest pain, trouble breathing High blood sugar (hyperglycemia)--increased thirst or amount of urine, unusual weakness or fatigue, blurry vision Mood swings, irritability, hostility Seizures Stroke--sudden numbness or weakness of the face, arm, or leg, trouble speaking, confusion, trouble walking, loss of balance or coordination, dizziness, severe headache, change in vision Thoughts of suicide or self-harm, worsening mood, feelings of depression Side effects that usually do not require medical attention (report to your care team if they continue or are bothersome): Bone pain Change in sex drive or performance General discomfort and fatigue Hot flashes Muscle pain Pain, redness, or irritation at injection site Swelling of the ankles, hands, or feet This list may not describe all possible side effects. Call your doctor for medical advice about side effects. You may report side effects to FDA at 1-800-FDA-1088. Where should I keep my medication? Keep out of the reach of children and pets. Store below 25 degrees C (77 degrees F). Do not freeze. Protect from light. Get rid of any unused medication after the expiration date. To get rid of medications that are no longer needed or have expired: Take the medication to a medication take-back program. Check with your pharmacy or law enforcement to find a location. If you cannot return the medication, ask your pharmacist or care team how to get rid of this medication safely. NOTE: This sheet is a summary. It may not cover all possible information. If you have questions about this medicine, talk to your doctor, pharmacist, or health care provider.  2023 Elsevier/Gold Standard (2021-01-12 00:00:00)  

## 2021-10-22 ENCOUNTER — Encounter: Payer: 59 | Admitting: Physical Medicine and Rehabilitation

## 2021-11-04 ENCOUNTER — Ambulatory Visit
Admission: EM | Admit: 2021-11-04 | Discharge: 2021-11-04 | Disposition: A | Discharge status: Home / Self Care | Payer: 59 | Attending: Internal Medicine | Admitting: Internal Medicine

## 2021-11-04 DIAGNOSIS — B379 Candidiasis, unspecified: Secondary | ICD-10-CM | POA: Insufficient documentation

## 2021-11-04 DIAGNOSIS — R300 Dysuria: Secondary | ICD-10-CM | POA: Insufficient documentation

## 2021-11-04 DIAGNOSIS — B370 Candidal stomatitis: Secondary | ICD-10-CM | POA: Diagnosis not present

## 2021-11-04 DIAGNOSIS — N760 Acute vaginitis: Secondary | ICD-10-CM | POA: Diagnosis not present

## 2021-11-04 LAB — POCT URINALYSIS DIP (MANUAL ENTRY)
Glucose, UA: NEGATIVE mg/dL
Leukocytes, UA: NEGATIVE
Nitrite, UA: NEGATIVE
Protein Ur, POC: 100 mg/dL — AB
Spec Grav, UA: >=1.03 — AB (ref 1.010–1.025)
Urobilinogen, UA: 0.2 E.U./dL
pH, UA: 6 (ref 5.0–8.0)

## 2021-11-04 MED ORDER — NYSTATIN 100000 UNIT/ML MT SUSP: 500000.0000 [IU] | Freq: Four times a day (QID) | OROMUCOSAL | 0 refills | Status: DC

## 2021-11-04 NOTE — ED Provider Notes (Signed)
[name]    CSN: [id number] Arrival date & time: 11/04/21  1452      History   Chief Complaint Chief Complaint  Patient presents with   Thrush         HPI Melissa Shepherd is a 51 y.o. female.   Patient presents with 2 different chief complaints.  Patient reports she is suspicious of thrush given white coating noted on tongue that started yesterday.  Parent reports that her infant currently has thrush and she ate food after him yesterday.  Patient also reports dysuria.  Patient states that she has a burning sensation in her urethra at all times even when not urinating.  Denies urinary frequency, abnormal vaginal discharge, hematuria, abdominal pain, fever, back pain.  Denies any concern or exposure to STD.     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    Patient Active Problem List   Diagnosis Date Noted   Chronic radicular lumbar pain 10/11/2021   Lumbar facet arthropathy 10/11/2021   Lumbar degenerative disc disease 10/11/2021   Chronic pain syndrome 10/11/2021   Lymphedema 06/27/2021   Cellulitis 06/20/2021   Open abdominal wall wound 06/19/2021   Hypokalemia 06/19/2021   Lesion of left native kidney 06/19/2021   Abdominal wall cellulitis 06/18/2021   Pain and swelling of lower leg 04/25/2021   Cellulitis and abscess of left lower extremity 03/31/2021   Malignant neoplasm of central portion of right breast in female, estrogen receptor positive ([place]) 03/06/2021   Cholelithiasis with acute on chronic cholecystitis without biliary obstruction 11/26/2016   Anxiety  08/18/2015   Mixed hyperlipidemia 08/18/2015   Vitamin D deficiency 08/18/2015   IBS (irritable bowel syndrome) 05/01/2015   Lumbago 10/06/2014   Bilateral thoracic back pain 10/06/2014   Chest pain 07/09/2014   Leg swelling 07/09/2014   Essential hypertension 06/11/2014   Depression 06/11/2014   Tobacco use disorder 06/11/2014    Past Surgical History:  Procedure Laterality Date   [place]. 2003.   2   CHOLECYSTECTOMY N/A 11/26/2016   Procedure: LAPAROSCOPIC CHOLECYSTECTOMY WITH INTRAOPERATIVE CHOLANGIOGRAM;  Surgeon: Fanny Skates, MD;  Location: [facility] OR;  Service: General;  Laterality: N/A;   MASTECTOMY Right    WISDOM TOOTH EXTRACTION      OB History   No obstetric history on file.      Home Medications    Prior to Admission medications   Medication Sig Start Date End Date Taking? Authorizing Provider  nystatin (MYCOSTATIN) 100000 UNIT/ML suspension Take 5 mLs (500,000 Units total) by mouth 4 (four) times daily. 11/04/21  Yes Dmiyah Liscano, Michele Rockers, FNP  ALPRAZolam Duanne Moron) 1 MG tablet Take 1 mg by mouth 3 (three) times daily.    [provider]  amLODipine (NORVASC) 10 MG tablet Take 10 mg by mouth daily. 03/16/21   [provider]  amphetamine-dextroamphetamine (ADDERALL) 20 MG tablet Take 20 mg by mouth 2 (two) times daily. 12/23/17   [provider]  ascorbic  acid (VITAMIN C) 250 MG CHEW Chew 500 mg by mouth at bedtime.    [provider]  aspirin 81 MG EC tablet Take 1 tablet (81 mg total) by mouth daily. Swallow whole. 06/22/21   Oswald Hillock, MD  CALCIUM PO Take 600 mg by mouth at bedtime.    [provider]  Chlorhexidine Gluconate Cloth 2 % PADS Apply 6 each topically daily at 6 (six) AM. Use 1 cloth per appendage. Then use the remaining clothes for your torso. Use daily x 5 days. Regardless of time of day 07/13/21   Carlyle Basques, MD  Cholecalciferol (VITAMIN D-3) 125 MCG (5000 UT) TABS Take 2 tablets by mouth at  bedtime.    [provider]  colestipol (COLESTID) 1 g tablet Take 1-2 g by mouth 2 (two) times daily. 02/01/21   [provider]  doxycycline (VIBRA-TABS) 100 MG tablet Take 1 tablet (100 mg total) by mouth 2 (two) times daily. Take on full stomach to minimize nausea Patient not taking: Reported on 10/11/2021 07/24/21   Carlyle Basques, MD  exemestane (AROMASIN) 25 MG tablet TAKE 1 TABLET (25 MG TOTAL) BY MOUTH DAILY. 09/27/21   Benay Pike, MD  folic acid (FOLVITE) 1 MG tablet Take 1 mg by mouth at bedtime. 12/29/20   [provider]  furosemide (LASIX) 20 MG tablet Take 20 mg by mouth daily. 09/10/21   [provider]  gabapentin (NEURONTIN) 300 MG capsule Take 300 mg by mouth 3 (three) times daily. 02/13/21   [provider]  hydrochlorothiazide (HYDRODIURIL) 12.5 MG tablet TAKE 1 TABLET BY MOUTH DAILY. Patient taking differently: Take 12.5 mg by mouth daily. 11/18/16   Leone Haven, MD  Iron-Vitamins (GERITOL PO) Take 1 tablet by mouth daily.    [provider]  meloxicam (MOBIC) 15 MG tablet Take 15 mg by mouth daily. 09/27/21   [provider]  methocarbamol (ROBAXIN) 500 MG tablet Take 500 mg by mouth 3 (three) times daily. 10/04/21   [provider]  metoprolol succinate (TOPROL-XL) 100 MG 24 hr tablet Take 100 mg by mouth daily. 03/12/21   [provider]  mupirocin ointment (BACTROBAN) 2 % Apply 1 application. topically 3 (three) times daily. As directed for 5 days 07/13/21   Carlyle Basques, MD  oxyCODONE-acetaminophen (PERCOCET/ROXICET) 5-325 MG tablet Take 1 tablet by mouth every 4 (four) hours as needed for moderate pain. Patient not taking: Reported on 10/11/2021 06/22/21   Oswald Hillock, MD  potassium chloride SA (KLOR-CON M) 20 MEQ tablet Take 20 mEq by mouth at bedtime. 03/09/21   [provider]  rosuvastatin (CRESTOR) 40 MG tablet Take 40 mg by mouth at bedtime.    [provider]   sertraline (ZOLOFT) 100 MG tablet Take 100 mg by mouth at bedtime.    [provider]  traMADol (ULTRAM) 50 MG tablet Take 50-100 mg by mouth every 8 (eight) hours as needed. 10/05/21   [provider]    Family History Family History  Problem Relation Age of Onset   Hypertension Mother    Cancer Sister        cervical   Breast cancer Paternal Aunt    Breast cancer Maternal Grandmother    Cancer Maternal Grandmother        Breast    Social History Social History   Tobacco Use   Smoking status: Every Day    Packs/day: 0.25    Years: 31.00    Total pack  years: 7.75    Types: Cigarettes   Smokeless tobacco: Never  Vaping Use   Vaping Use: Never used  Substance Use Topics   Alcohol use: Not Currently    Alcohol/week: 2.0 standard drinks of alcohol    Types: 2 Cans of beer per week   Drug use: No     Allergies   Ezetimibe and Prednisone   Review of Systems Review of Systems Per HPI  Physical Exam Triage Vital Signs ED Triage Vitals  Enc Vitals Group     BP 11/04/21 1534 (!) 140/88     Pulse Rate 11/04/21 1534 61     Resp 11/04/21 1534 16     Temp 11/04/21 1534 98 F (36.7 C)     Temp Source 11/04/21 1534 Oral     SpO2 11/04/21 1534 98 %     Weight --      Height --      Head Circumference --      Peak Flow --      Pain Score 11/04/21 1533 0     Pain Loc --      Pain Edu? --      Excl. in [place]? --    No data found.  Updated Vital Signs BP (!) 140/88 (BP Location: Left Arm)   Pulse 61   Temp 98 F (36.7 C) (Oral)   Resp 16   LMP 05/08/2017 (Approximate) Comment: neg preg test  SpO2 98%   Visual Acuity Right Eye Distance:   Left Eye Distance:   Bilateral Distance:    Right Eye Near:   Left Eye Near:    Bilateral Near:     Physical Exam Constitutional:      General: She is not in acute distress.    Appearance: Normal appearance. She is not toxic-appearing or diaphoretic.  HENT:     Head: Normocephalic and atraumatic.      Mouth/Throat:     Comments: White coating to tongue.  No swelling noted.  Posterior pharynx appears normal. Eyes:     Extraocular Movements: Extraocular movements intact.     Conjunctiva/sclera: Conjunctivae normal.  Cardiovascular:     Rate and Rhythm: Normal rate and regular rhythm.     Pulses: Normal pulses.     Heart sounds: Normal heart sounds.  Pulmonary:     Effort: Pulmonary effort is normal. No respiratory distress.     Breath sounds: Normal breath sounds.  Abdominal:     General: Bowel sounds are normal. There is no distension.     Palpations: Abdomen is soft.     Tenderness: There is no abdominal tenderness.  Genitourinary:    Comments: Deferred with shared decision-making.  Self swab performed. Neurological:     General: No focal deficit present.     Mental Status: She is alert and oriented to person, place, and time. Mental status is at baseline.  Psychiatric:        Mood and Affect: Mood normal.        Behavior: Behavior normal.        Thought Content: Thought content normal.        Judgment: Judgment normal.      UC Treatments / Results  Labs (all labs ordered are listed, but only abnormal results are displayed) Labs Reviewed  POCT URINALYSIS DIP (MANUAL ENTRY) - Abnormal; Notable for the following components:      Result Value   Bilirubin, UA moderate (*)    Ketones, POC UA small (15) (*)  Spec Grav, UA >=1.030 (*)    Blood, UA trace-intact (*)    Protein Ur, POC =100 (*)    All other components within normal limits  CBC  COMPREHENSIVE METABOLIC PANEL  CERVICOVAGINAL ANCILLARY ONLY    EKG   Radiology No results found.  Procedures Procedures (including critical care time)  Medications Ordered in UC Medications - No data to display  Initial Impression / Assessment and Plan / UC Course  I have reviewed the triage vital signs and the nursing notes.  Pertinent labs & imaging results that were available during my care of the patient were  reviewed by me and considered in my medical decision making (see chart for details).     Patient does appear to have thrush on physical exam.  Will treat with nystatin mouthwash.  Urinalysis not indicating urinary tract infection.  There is a possibility that patient could have vaginitis as cause of discomfort.  Cervicovaginal swab pending to rule out BV and yeast as patient does not have any concern for STD.  Will await results for any further treatment.  Urinalysis showing abnormalities that is most likely related to low p.o. intake, although will obtain CMP and CBC to rule out any other worrisome etiologies.  Patient was given strict return precautions and advised to follow-up if any symptoms persist or worsen.  Patient verbalized understanding and was agreeable with plan. Final Clinical Impressions(s) / UC Diagnoses   Final diagnoses:  Thrush  Dysuria     Discharge Instructions      You are being treated with an antifungal mouthwash to help alleviate the rash.  Your urine did not show any signs of infection but did show some abnormalities that is requiring blood work.  Blood work is pending.  We will call if there are any abnormalities.  Your vaginal swab is also pending.  We will call if it is abnormal and treat as appropriate.  Please follow-up if any symptoms persist or worsen.    ED Prescriptions     Medication Sig Dispense Auth. Provider   nystatin (MYCOSTATIN) 100000 UNIT/ML suspension Take 5 mLs (500,000 Units total) by mouth 4 (four) times daily. 60 mL Teodora Medici, [place]      PDMP not reviewed this encounter.   Teodora Medici, [place] 11/04/21 [id number]

## 2021-11-04 NOTE — Discharge Instructions (Signed)
You are being treated with an antifungal mouthwash to help alleviate the rash.  Your urine did not show any signs of infection but did show some abnormalities that is requiring blood work.  Blood work is pending.  We will call if there are any abnormalities.  Your vaginal swab is also pending.  We will call if it is abnormal and treat as appropriate.  Please follow-up if any symptoms persist or worsen.

## 2021-11-04 NOTE — ED Triage Notes (Signed)
Pt c/o concern for oral thrush onset ~ yesterday + UTI

## 2021-11-06 ENCOUNTER — Telehealth (HOSPITAL_COMMUNITY): Payer: Self-pay | Admitting: Emergency Medicine

## 2021-11-06 LAB — CBC
Hematocrit: 46.9 % — ABNORMAL HIGH (ref 34.0–46.6)
Hemoglobin: 15.4 g/dL (ref 11.1–15.9)
MCH: 29.8 pg (ref 26.6–33.0)
MCHC: 32.8 g/dL (ref 31.5–35.7)
MCV: 91 fL (ref 79–97)
Platelets: 334 10*3/uL (ref 150–450)
RBC: 5.16 x10E6/uL (ref 3.77–5.28)
RDW: 13 % (ref 11.7–15.4)
WBC: 10.3 10*3/uL (ref 3.4–10.8)

## 2021-11-06 LAB — COMPREHENSIVE METABOLIC PANEL
ALT: 18 IU/L (ref 0–32)
AST: 19 IU/L (ref 0–40)
Albumin/Globulin Ratio: 2.5 — ABNORMAL HIGH (ref 1.2–2.2)
Albumin: 5.4 g/dL — ABNORMAL HIGH (ref 3.9–4.9)
Alkaline Phosphatase: 82 IU/L (ref 44–121)
BUN/Creatinine Ratio: 17 (ref 9–23)
BUN: 14 mg/dL (ref 6–24)
Bilirubin Total: 0.6 mg/dL (ref 0.0–1.2)
CO2: 21 mmol/L (ref 20–29)
Calcium: 10.6 mg/dL — ABNORMAL HIGH (ref 8.7–10.2)
Chloride: 100 mmol/L (ref 96–106)
Creatinine, Ser: 0.84 mg/dL (ref 0.57–1.00)
Globulin, Total: 2.2 g/dL (ref 1.5–4.5)
Glucose: 59 mg/dL — ABNORMAL LOW (ref 70–99)
Potassium: 3.8 mmol/L (ref 3.5–5.2)
Sodium: 144 mmol/L (ref 134–144)
Total Protein: 7.6 g/dL (ref 6.0–8.5)
eGFR: 85 mL/min/{1.73_m2} (ref >59)

## 2021-11-06 LAB — CERVICOVAGINAL ANCILLARY ONLY
Bacterial Vaginitis (gardnerella): POSITIVE — AB
Candida Glabrata: POSITIVE — AB
Candida Vaginitis: NEGATIVE
Comment: NEGATIVE
Comment: NEGATIVE
Comment: NEGATIVE

## 2021-11-06 MED ORDER — FLUCONAZOLE 150 MG PO TABS: 150.0000 mg | ORAL_TABLET | Freq: Once | ORAL | 0 refills | Status: AC

## 2021-11-06 MED ORDER — METRONIDAZOLE 500 MG PO TABS: 500.0000 mg | ORAL_TABLET | Freq: Two times a day (BID) | ORAL | 0 refills | Status: DC

## 2021-11-15 ENCOUNTER — Inpatient Hospital Stay (HOSPITAL_BASED_OUTPATIENT_CLINIC_OR_DEPARTMENT_OTHER): Payer: 59 | Admitting: Adult Health

## 2021-11-15 ENCOUNTER — Encounter: Payer: Self-pay | Admitting: Adult Health

## 2021-11-15 ENCOUNTER — Inpatient Hospital Stay: Payer: 59 | Attending: Hematology

## 2021-11-15 VITALS — BP 130/82 | HR 80 | Temp 98.1°F | Resp 18 | Ht 65.0 in | Wt 180.5 lb

## 2021-11-15 DIAGNOSIS — Z170 Estrogen receptor positive status [ER+]: Secondary | ICD-10-CM | POA: Diagnosis not present

## 2021-11-15 DIAGNOSIS — C50111 Malignant neoplasm of central portion of right female breast: Secondary | ICD-10-CM | POA: Insufficient documentation

## 2021-11-15 DIAGNOSIS — Z79811 Long term (current) use of aromatase inhibitors: Secondary | ICD-10-CM | POA: Insufficient documentation

## 2021-11-15 MED ORDER — LEUPROLIDE ACETATE 3.75 MG IM KIT
3.7500 mg | PACK | Freq: Once | INTRAMUSCULAR | Status: AC
  Administered 2021-11-15: 3.75 mg via INTRAMUSCULAR
  Filled 2021-11-15: qty 3.75

## 2021-11-15 NOTE — Progress Notes (Signed)
[facility] up:    Melissa Shepherd, [place] [address]   DIAGNOSIS:  Cancer Staging  Malignant neoplasm of central portion of right breast in female, estrogen receptor positive ([place]) Staging form: Breast, AJCC 8th Edition - Pathologic stage from 01/27/2020: Stage IA (pT1a, pN0, cM0, G3, ER+, PR+, HER2-) - Signed by Truitt Merle, MD on 03/18/2021 Stage prefix: Initial diagnosis Multigene prognostic tests performed: None Histologic grading system: 3 grade system Residual tumor (R): R0 - None   SUMMARY OF ONCOLOGIC HISTORY: 1. Status post right mastectomy with immediate reconstruction on 01/27/2020. Pathology revealed 3 mm grade 3 invasive ductal carcinoma with associated DCIS. 4 sentinel nodes negative for metastatic disease. Breast prognostic panel showed tumor to be ER +50%, moderate intensity. PR positive, 5%, weak intensity. HER-2/neu negative with Ki-67 10%.  2. Initiation of Lupron monthly March 20, 2020.  3. Initiation of adjuvant anastrozole 1 mg daily, patient discontinued secondary to myalgias and arthralgias.  4. Initiate letrozole 2.5 mg daily, 11/14/2020- 12/19/2020, discontinued secondary to myalgias and arthralgias  5. Initiation of Aromasin 25 mg 12/19/2020   CURRENT THERAPY: Aromasin/Lupron  INTERVAL HISTORY: Melissa Shepherd 51 y.o. female returns for follow-up and evaluation of how she is doing on Lupron and Aromasin.  She notes that she has been struggling because of social issues with her family at home.  She is tolerating injections and Aromasin moderately well.  She underwent bone density testing on Jul 18, 2021 that showed normal bone density.  Underwent left breast mammogram that showed no mammographic evidence of malignancy and breast density category C.  She had a cyst noted at the right mastectomy scar that underwent ultrasound in June 2023.  Follow-up ultrasound was recommended to be completed in December  2023.  Patient Active Problem List   Diagnosis Date Noted   Chronic radicular lumbar pain 10/11/2021   Lumbar facet arthropathy 10/11/2021   Lumbar degenerative disc disease 10/11/2021   Chronic pain syndrome 10/11/2021   Lymphedema 06/27/2021   Cellulitis 06/20/2021   Open abdominal wall wound 06/19/2021   Hypokalemia 06/19/2021   Lesion of left native kidney 06/19/2021   Abdominal wall cellulitis 06/18/2021   Pain and swelling of lower leg 04/25/2021   Cellulitis and abscess of left lower extremity 03/31/2021   Malignant neoplasm of central portion of right breast in female, estrogen receptor positive ([place]) 03/06/2021   Osteoarthritis of knee 03/19/2017   Cholelithiasis with acute on chronic cholecystitis without biliary obstruction 11/26/2016   Anxiety 08/18/2015   Mixed hyperlipidemia 08/18/2015   Vitamin D deficiency 08/18/2015   IBS (irritable bowel syndrome) 05/01/2015   Lumbago 10/06/2014   Bilateral thoracic back pain 10/06/2014   Chest pain 07/09/2014   Leg swelling 07/09/2014   Essential hypertension 06/11/2014   Depression 06/11/2014   Tobacco use disorder 06/11/2014   History of abnormal cervical Pap smear 05/11/2013    is allergic to ezetimibe and prednisone.  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 2003.   2   CHOLECYSTECTOMY N/A 11/26/2016   Procedure: LAPAROSCOPIC CHOLECYSTECTOMY WITH INTRAOPERATIVE CHOLANGIOGRAM;  Surgeon: Fanny Skates, MD;   Location: [facility];  Service: General;  Laterality: N/A;   MASTECTOMY Right    WISDOM TOOTH EXTRACTION      SOCIAL HISTORY: Social History   Socioeconomic History   Marital status: Married    Spouse name: Not on file   Number of children: Not on file   Years of education: Not on file   Highest education level: Not on file  Occupational History   Not on file  Tobacco Use   Smoking status: Every Day    Packs/day: 0.25    Years: 31.00    Total pack years: 7.75    Types: Cigarettes   Smokeless tobacco: Never  Vaping Use   Vaping Use: Never used  Substance and Sexual Activity   Alcohol use: Not Currently    Alcohol/week: 2.0 standard drinks of alcohol    Types: 2 Cans of beer per week   Drug use: No   Sexual activity: Not Currently  Other Topics Concern   Not on file  Social History Narrative   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   High school degree and cosmetology school    Right handed    Caffeine- 1 cup of coffee, 1 soda    Pets: 1 dog inside    Social Determinants of Health   Financial Resource Strain: Not on file  Food Insecurity: Food Insecurity Present (08/02/2021)   Hunger Vital Sign    Worried About Running Out of Food in the Last Year: Often true    Ran Out of Food in the Last Year: Often true  Transportation Needs: No Transportation Needs (08/02/2021)   PRAPARE - Hydrologist (Medical): No    Lack of Transportation (Non-Medical): No  Physical Activity: Not on file  Stress: Not on file  Social Connections: Not on file  Intimate Partner Violence: Not on file    FAMILY HISTORY: Family History  Problem Relation Age of Onset   Hypertension Mother    Cancer Sister        cervical   Breast cancer Paternal Aunt    Breast cancer Maternal Grandmother    Cancer Maternal Grandmother        Breast    Review of Systems  Constitutional:  Negative for appetite change, chills, fatigue, fever and unexpected weight  change.  HENT:   Negative for hearing loss, lump/mass and trouble swallowing.   Eyes:  Negative for eye problems and icterus.  Respiratory:  Negative for chest tightness, cough and shortness of breath.   Cardiovascular:  Negative for chest pain, leg swelling and palpitations.  Gastrointestinal:  Negative for abdominal distention, abdominal pain, constipation, diarrhea, nausea and vomiting.  Endocrine: Negative for hot flashes.  Genitourinary:  Negative for difficulty urinating.   Musculoskeletal:  Negative for arthralgias.  Skin:  Negative for itching and rash.  Neurological:  Negative for dizziness, extremity weakness, headaches and numbness.  Hematological:  Negative for adenopathy. Does not bruise/bleed easily.  Psychiatric/Behavioral:  Negative for depression. The patient is not nervous/anxious.       PHYSICAL EXAMINATION  ECOG PERFORMANCE STATUS: 1 - Symptomatic but completely ambulatory  Vitals:   11/15/21 1406  BP:  130/82  Pulse: 80  Resp: 18  Temp: 98.1 F (36.7 C)  SpO2: 97%    Physical Exam Constitutional:      General: She is not in acute distress.    Appearance: Normal appearance. She is not toxic-appearing.  HENT:     Head: Normocephalic and atraumatic.  Eyes:     General: No scleral icterus. Cardiovascular:     Rate and Rhythm: Normal rate and regular rhythm.     Pulses: Normal pulses.     Heart sounds: Normal heart sounds.  Pulmonary:     Effort: Pulmonary effort is normal.     Breath sounds: Normal breath sounds.  Chest:     Comments: Right breast status postmastectomy and reconstruction no sign of local recurrence left breast is benign. Abdominal:     General: Abdomen is flat. Bowel sounds are normal. There is no distension.     Palpations: Abdomen is soft.     Tenderness: There is no abdominal tenderness.  Musculoskeletal:        General: No swelling.     Cervical back: Neck supple.  Lymphadenopathy:     Cervical: No cervical adenopathy.   Skin:    General: Skin is warm and dry.     Findings: No rash.  Neurological:     General: No focal deficit present.     Mental Status: She is alert.  Psychiatric:        Mood and Affect: Mood normal.        Behavior: Behavior normal.     LABORATORY DATA:  CBC    Component Value Date/Time   WBC 10.3 11/04/2021 1609   WBC 6.2 06/20/2021 0528   RBC 5.16 11/04/2021 1609   RBC 4.43 06/20/2021 0528   HGB 15.4 11/04/2021 1609   HCT 46.9 (H) 11/04/2021 1609   PLT 334 11/04/2021 1609   MCV 91 11/04/2021 1609   MCH 29.8 11/04/2021 1609   MCH 29.8 06/20/2021 0528   MCHC 32.8 11/04/2021 1609   MCHC 33.8 06/20/2021 0528   RDW 13.0 11/04/2021 1609   LYMPHSABS 2.7 06/20/2021 0528   MONOABS 0.4 06/20/2021 0528   EOSABS 0.2 06/20/2021 0528   BASOSABS 0.1 06/20/2021 0528    CMP     Component Value Date/Time   NA 144 11/04/2021 1609   K 3.8 11/04/2021 1609   CL 100 11/04/2021 1609   CO2 21 11/04/2021 1609   GLUCOSE 59 (L) 11/04/2021 1609   GLUCOSE 95 06/20/2021 0528   BUN 14 11/04/2021 1609   CREATININE 0.84 11/04/2021 1609   CREATININE 0.76 04/10/2021 1139   CALCIUM 10.6 (H) 11/04/2021 1609   PROT 7.6 11/04/2021 1609   ALBUMIN 5.4 (H) 11/04/2021 1609   AST 19 11/04/2021 1609   AST 16 04/10/2021 1139   ALT 18 11/04/2021 1609   ALT 21 04/10/2021 1139   ALKPHOS 82 11/04/2021 1609   BILITOT 0.6 11/04/2021 1609   BILITOT 0.1 (L) 04/10/2021 1139   GFRNONAA >60 06/20/2021 0528   GFRNONAA >60 04/10/2021 1139   GFRAA >60 05/17/2017 1943   ASSESSMENT and THERAPY PLAN:   Malignant neoplasm of central portion of right breast in female, estrogen receptor positive (HCC) Melissa Shepherd is a 51 year old woman with history of right-sided stage Ia estrogen positive breast cancer status postmastectomy and Lupron along with Aromasin.  Melissa Shepherd has no clinical or radiographic sign of breast cancer recurrence.  She will continue on her current treatment with Lupron monthly and Aromasin  daily.  She  spent a majority of the day talking about her social issues and challenges with her family at home.  I offered her encouragement about these issues.  We will see Melissa Shepherd back in December for labs, follow-up, and her next injection.  She will continue to return every 4 weeks for injections due in October and November however.  She knows to call for any questions or concerns that may arise between now and her next appointment.    All questions were answered. The patient knows to call the clinic with any problems, questions or concerns. We can certainly see the patient much sooner if necessary.  Total encounter time:30 minutes*in face-to-face visit time, chart review, lab review, care coordination, order entry, and documentation of the encounter time.    Wilber Bihari, NP 11/22/21 1:55 PM Medical Oncology and Hematology [facility] [place], [place] [address] Tel. [phone]    Fax. [phone]  *Total Encounter Time as defined by the Centers for Medicare and Medicaid Services includes, in addition to the face-to-face time of a patient visit (documented in the note above) non-face-to-face time: obtaining and reviewing outside history, ordering and reviewing medications, tests or procedures, care coordination (communications with other health care professionals or caregivers) and documentation in the medical record.

## 2021-11-16 ENCOUNTER — Telehealth: Payer: Self-pay | Admitting: Adult Health

## 2021-11-16 NOTE — Telephone Encounter (Signed)
Scheduled appointment per 9/21 los. Patient is aware.

## 2021-11-19 ENCOUNTER — Other Ambulatory Visit: Payer: No Typology Code available for payment source

## 2021-11-22 ENCOUNTER — Encounter: Payer: Self-pay | Admitting: Hematology

## 2021-11-22 NOTE — Assessment & Plan Note (Signed)
Melissa Shepherd is a 51 year old woman with history of right-sided stage Ia estrogen positive breast cancer status postmastectomy and Lupron along with Aromasin.  [name] has no clinical or radiographic sign of breast cancer recurrence.  She will continue on her current treatment with Lupron monthly and Aromasin daily.  She spent a majority of the day talking about her social issues and challenges with her family at home.  I offered her encouragement about these issues.  We will see [name] back in December for labs, follow-up, and her next injection.  She will continue to return every 4 weeks for injections due in October and November however.  She knows to call for any questions or concerns that may arise between now and her next appointment.

## 2021-11-26 ENCOUNTER — Encounter: Payer: Self-pay | Admitting: Hematology

## 2021-11-26 ENCOUNTER — Ambulatory Visit
Admission: RE | Admit: 2021-11-26 | Discharge: 2021-11-26 | Disposition: A | Discharge status: Home / Self Care | Payer: No Typology Code available for payment source | Source: Ambulatory Visit | Attending: Obstetrics and Gynecology | Admitting: Obstetrics and Gynecology

## 2021-11-26 ENCOUNTER — Other Ambulatory Visit: Payer: Self-pay | Admitting: Obstetrics and Gynecology

## 2021-11-26 DIAGNOSIS — N6310 Unspecified lump in the right breast, unspecified quadrant: Secondary | ICD-10-CM

## 2021-11-29 ENCOUNTER — Other Ambulatory Visit: Payer: Self-pay | Admitting: Hematology and Oncology

## 2021-11-30 ENCOUNTER — Encounter: Payer: Self-pay | Admitting: Hematology

## 2021-12-13 ENCOUNTER — Inpatient Hospital Stay: Payer: 59 | Attending: Hematology

## 2021-12-13 DIAGNOSIS — M545 Low back pain, unspecified: Secondary | ICD-10-CM | POA: Insufficient documentation

## 2021-12-13 DIAGNOSIS — Z170 Estrogen receptor positive status [ER+]: Secondary | ICD-10-CM | POA: Diagnosis not present

## 2021-12-13 DIAGNOSIS — C50111 Malignant neoplasm of central portion of right female breast: Secondary | ICD-10-CM | POA: Diagnosis not present

## 2021-12-13 MED ORDER — LEUPROLIDE ACETATE 3.75 MG IM KIT
3.7500 mg | PACK | Freq: Once | INTRAMUSCULAR | Status: AC
  Administered 2021-12-13: 3.75 mg via INTRAMUSCULAR
  Filled 2021-12-13: qty 3.75

## 2021-12-24 ENCOUNTER — Encounter (INDEPENDENT_AMBULATORY_CARE_PROVIDER_SITE_OTHER): Payer: Self-pay

## 2021-12-24 ENCOUNTER — Ambulatory Visit: Payer: 59 | Admitting: Physical Medicine and Rehabilitation

## 2022-01-08 ENCOUNTER — Ambulatory Visit: Payer: 59 | Admitting: Urology

## 2022-01-10 ENCOUNTER — Other Ambulatory Visit: Payer: Self-pay

## 2022-01-10 ENCOUNTER — Inpatient Hospital Stay: Payer: 59 | Attending: Hematology

## 2022-01-10 VITALS — BP 132/88 | HR 78 | Temp 98.4°F | Resp 18

## 2022-01-10 DIAGNOSIS — Z170 Estrogen receptor positive status [ER+]: Secondary | ICD-10-CM | POA: Insufficient documentation

## 2022-01-10 DIAGNOSIS — Z79818 Long term (current) use of other agents affecting estrogen receptors and estrogen levels: Secondary | ICD-10-CM | POA: Insufficient documentation

## 2022-01-10 DIAGNOSIS — C50111 Malignant neoplasm of central portion of right female breast: Secondary | ICD-10-CM | POA: Diagnosis not present

## 2022-01-10 MED ORDER — LEUPROLIDE ACETATE 3.75 MG IM KIT
3.7500 mg | PACK | Freq: Once | INTRAMUSCULAR | Status: AC
  Administered 2022-01-10: 3.75 mg via INTRAMUSCULAR
  Filled 2022-01-10: qty 3.75

## 2022-01-12 ENCOUNTER — Ambulatory Visit
Admission: RE | Admit: 2022-01-12 | Discharge: 2022-01-12 | Disposition: A | Discharge status: Home / Self Care | Payer: No Typology Code available for payment source | Source: Ambulatory Visit | Attending: Urology | Admitting: Urology

## 2022-01-12 DIAGNOSIS — N2889 Other specified disorders of kidney and ureter: Secondary | ICD-10-CM

## 2022-01-12 MED ORDER — GADOPICLENOL 0.5 MMOL/ML IV SOLN
8.0000 mL | Freq: Once | INTRAVENOUS | Status: AC | PRN
  Administered 2022-01-12: 8 mL via INTRAVENOUS

## 2022-01-22 ENCOUNTER — Ambulatory Visit: Payer: 59 | Admitting: Urology

## 2022-02-06 ENCOUNTER — Other Ambulatory Visit: Payer: Self-pay | Admitting: *Deleted

## 2022-02-06 DIAGNOSIS — Z170 Estrogen receptor positive status [ER+]: Secondary | ICD-10-CM

## 2022-02-07 ENCOUNTER — Inpatient Hospital Stay (HOSPITAL_BASED_OUTPATIENT_CLINIC_OR_DEPARTMENT_OTHER): Payer: Medicaid Other | Admitting: Hematology and Oncology

## 2022-02-07 ENCOUNTER — Inpatient Hospital Stay: Payer: Medicaid Other | Attending: Hematology

## 2022-02-07 ENCOUNTER — Inpatient Hospital Stay: Payer: Medicaid Other

## 2022-02-07 VITALS — BP 125/81 | HR 68 | Temp 97.5°F | Resp 16 | Wt 173.6 lb

## 2022-02-07 DIAGNOSIS — Z79811 Long term (current) use of aromatase inhibitors: Secondary | ICD-10-CM | POA: Insufficient documentation

## 2022-02-07 DIAGNOSIS — I10 Essential (primary) hypertension: Secondary | ICD-10-CM | POA: Insufficient documentation

## 2022-02-07 DIAGNOSIS — R079 Chest pain, unspecified: Secondary | ICD-10-CM | POA: Diagnosis not present

## 2022-02-07 DIAGNOSIS — C50111 Malignant neoplasm of central portion of right female breast: Secondary | ICD-10-CM | POA: Diagnosis not present

## 2022-02-07 DIAGNOSIS — F17210 Nicotine dependence, cigarettes, uncomplicated: Secondary | ICD-10-CM | POA: Diagnosis not present

## 2022-02-07 DIAGNOSIS — R0602 Shortness of breath: Secondary | ICD-10-CM | POA: Diagnosis not present

## 2022-02-07 DIAGNOSIS — Z170 Estrogen receptor positive status [ER+]: Secondary | ICD-10-CM

## 2022-02-07 DIAGNOSIS — Z7689 Persons encountering health services in other specified circumstances: Secondary | ICD-10-CM | POA: Diagnosis not present

## 2022-02-07 DIAGNOSIS — Z9011 Acquired absence of right breast and nipple: Secondary | ICD-10-CM | POA: Insufficient documentation

## 2022-02-07 LAB — CMP (CANCER CENTER ONLY)
ALT: 17 U/L (ref 0–44)
AST: 17 U/L (ref 15–41)
Albumin: 4.3 g/dL (ref 3.5–5.0)
Alkaline Phosphatase: 60 U/L (ref 38–126)
Anion gap: 7 (ref 5–15)
BUN: 18 mg/dL (ref 6–20)
CO2: 29 mmol/L (ref 22–32)
Calcium: 9.6 mg/dL (ref 8.9–10.3)
Chloride: 105 mmol/L (ref 98–111)
Creatinine: 0.97 mg/dL (ref 0.44–1.00)
GFR, Estimated: >60 mL/min (ref >60)
Glucose, Bld: 81 mg/dL (ref 70–99)
Potassium: 3.3 mmol/L — ABNORMAL LOW (ref 3.5–5.1)
Sodium: 141 mmol/L (ref 135–145)
Total Bilirubin: 0.4 mg/dL (ref 0.3–1.2)
Total Protein: 6.2 g/dL — ABNORMAL LOW (ref 6.5–8.1)

## 2022-02-07 LAB — CBC WITH DIFFERENTIAL (CANCER CENTER ONLY)
Abs Immature Granulocytes: 0.03 10*3/uL (ref 0.00–0.07)
Basophils Absolute: 0 10*3/uL (ref 0.0–0.1)
Basophils Relative: 0 %
Eosinophils Absolute: 0.2 10*3/uL (ref 0.0–0.5)
Eosinophils Relative: 2 %
HCT: 35 % — ABNORMAL LOW (ref 36.0–46.0)
Hemoglobin: 12 g/dL (ref 12.0–15.0)
Immature Granulocytes: 0 %
Lymphocytes Relative: 31 %
Lymphs Abs: 3.1 10*3/uL (ref 0.7–4.0)
MCH: 30.6 pg (ref 26.0–34.0)
MCHC: 34.3 g/dL (ref 30.0–36.0)
MCV: 89.3 fL (ref 80.0–100.0)
Monocytes Absolute: 0.6 10*3/uL (ref 0.1–1.0)
Monocytes Relative: 6 %
Neutro Abs: 6 10*3/uL (ref 1.7–7.7)
Neutrophils Relative %: 61 %
Platelet Count: 269 10*3/uL (ref 150–400)
RBC: 3.92 MIL/uL (ref 3.87–5.11)
RDW: 13.6 % (ref 11.5–15.5)
WBC Count: 10 10*3/uL (ref 4.0–10.5)
nRBC: 0 % (ref 0.0–0.2)

## 2022-02-07 MED ORDER — LEUPROLIDE ACETATE 3.75 MG IM KIT
3.7500 mg | PACK | Freq: Once | INTRAMUSCULAR | Status: AC
  Administered 2022-02-07: 3.75 mg via INTRAMUSCULAR
  Filled 2022-02-07: qty 3.75

## 2022-02-07 NOTE — Assessment & Plan Note (Addendum)
This is a 52 year old premenopausal female patient with right breast invasive ductal carcinoma status post right mastectomy, final pathology showing 8 mm right breast IDC with associated DCIS, 4 sentinel nodes negative, prognostic showed ER 50% positive, PR positive, 5% weak intensity and HER2 negative with Ki-67 of 10%.  She has been receiving adjuvant ovarian suppression with Lupron and Aromasin.   She has tried Arimidex as well as letrozole in the past, could not tolerate because of arthralgias.  She is switching providers because of some change in insurance.   She continues on leuprolide and exemestane as recommended. Physical exam with no concerns except the small cluster of nodules, which were thought to be fat necrosis. Repeat US recommended. Bone density May 2023 normal, no evidence of osteopenia or osteoporosis. She will proceed with the leuprolide every 28 days and RTC in approx 6 months. She was encouraged to contact us with any new questions or concerns.

## 2022-02-07 NOTE — Progress Notes (Signed)
[facility] Follow up:    Remi Haggard, [place] [address]   DIAGNOSIS:  Cancer Staging  Malignant neoplasm of central portion of right breast in female, estrogen receptor positive ([place]) Staging form: Breast, AJCC 8th Edition - Pathologic stage from 01/27/2020: Stage IA (pT1a, pN0, cM0, G3, ER+, PR+, HER2-) - Signed by Truitt Merle, MD on 03/18/2021 Stage prefix: Initial diagnosis Multigene prognostic tests performed: None Histologic grading system: 3 grade system Residual tumor (R): R0 - None   SUMMARY OF ONCOLOGIC HISTORY:  1. Status post right mastectomy with immediate reconstruction on 01/27/2020. Pathology revealed 3 mm grade 3 invasive ductal carcinoma with associated DCIS. 4 sentinel nodes negative for metastatic disease. Breast prognostic panel showed tumor to be ER +50%, moderate intensity. PR positive, 5%, weak intensity. HER-2/neu negative with Ki-67 10%.  2. Initiation of Lupron monthly March 20, 2020.  3. Initiation of adjuvant anastrozole 1 mg daily, patient discontinued secondary to myalgias and arthralgias.  4. Initiate letrozole 2.5 mg daily, 11/14/2020- 12/19/2020, discontinued secondary to myalgias and arthralgias  5. Initiation of Aromasin 25 mg 12/19/2020   CURRENT THERAPY: Aromasin/Lupron  INTERVAL HISTORY:  Melissa Shepherd 51 y.o. female returns for follow-up and evaluation of how she is doing on Lupron and Aromasin.    She notes that she has been struggling because of back pain, knee pain. She has lost lot of weight because she hasn't been eating. She underwent bone density testing on Jul 18, 2021 that showed normal bone density.  Underwent left breast mammogram that showed no mammographic evidence of malignancy and breast density category C.  She had a cyst noted at the right mastectomy scar that underwent ultrasound in June 2023.   [facility] [place] once again recommended LT breast diagnostic mammogram and right axillary  [place]. Previous changes were thought to be due to fat necrosis. These have been ordered but not scheduled  Patient Active Problem List   Diagnosis Date Noted   Chronic radicular lumbar pain 10/11/2021   Lumbar facet arthropathy 10/11/2021   Lumbar degenerative disc disease 10/11/2021   Chronic pain syndrome 10/11/2021   Lymphedema 06/27/2021   Cellulitis 06/20/2021   Open abdominal wall wound 06/19/2021   Hypokalemia 06/19/2021   Lesion of left native kidney 06/19/2021   Abdominal wall cellulitis 06/18/2021   Pain and swelling of lower leg 04/25/2021   Cellulitis and abscess of left lower extremity 03/31/2021   Malignant neoplasm of central portion of right breast in female, estrogen receptor positive ([place]) 03/06/2021   Osteoarthritis of knee 03/19/2017   Cholelithiasis with acute on chronic cholecystitis without biliary obstruction 11/26/2016   Anxiety 08/18/2015   Mixed hyperlipidemia 08/18/2015   Vitamin D deficiency 08/18/2015   IBS (irritable bowel syndrome) 05/01/2015   Lumbago 10/06/2014   Bilateral thoracic back pain 10/06/2014   Chest pain 07/09/2014   Leg swelling 07/09/2014   Essential hypertension 06/11/2014   Depression 06/11/2014   Tobacco use disorder 06/11/2014   History of abnormal cervical Pap smear 05/11/2013    is allergic to ezetimibe and prednisone.  MEDICAL HISTORY: Past Medical History:  Diagnosis Date   Anginal pain ([place])    Anxiety    panic attacks   Cancer ([facility])    Chicken pox    Cholelithiasis with acute on chronic cholecystitis without biliary obstruction 11/26/2016   Depression    Diarrhea    Dyspnea    with chest pain   Fatty liver  Frequent headaches    Hypertension    IBS (irritable bowel syndrome)    Multiple thyroid nodules    Seizures ([place])    pregenancy- toxemia- 1991    SURGICAL HISTORY: Past Surgical History:  Procedure Laterality Date   CESAREAN SECTION  1991. 2003.   2   CHOLECYSTECTOMY N/A 11/26/2016   Procedure:  LAPAROSCOPIC CHOLECYSTECTOMY WITH INTRAOPERATIVE CHOLANGIOGRAM;  Surgeon: Fanny Skates, MD;  Location: [facility];  Service: General;  Laterality: N/A;   MASTECTOMY Right    WISDOM TOOTH EXTRACTION      SOCIAL HISTORY: Social History   Socioeconomic History   Marital status: Married    Spouse name: Not on file   Number of children: Not on file   Years of education: Not on file   Highest education level: Not on file  Occupational History   Not on file  Tobacco Use   Smoking status: Every Day    Packs/day: 0.25    Years: 31.00    Total pack years: 7.75    Types: Cigarettes   Smokeless tobacco: Never  Vaping Use   Vaping Use: Never used  Substance and Sexual Activity   Alcohol use: Not Currently    Alcohol/week: 2.0 standard drinks of alcohol    Types: 2 Cans of beer per week   Drug use: No   Sexual activity: Not Currently  Other Topics Concern   Not on file  Social History 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   High school degree and cosmetology school    Right handed    Caffeine- 1 cup of coffee, 1 soda    Pets: 1 dog inside    Social Determinants of Health   Financial Resource Strain: Not on file  Food Insecurity: Food Insecurity Present (08/02/2021)   Hunger Vital Sign    Worried About Running Out of Food in the Last Year: Often true    Ran Out of Food in the Last Year: Often true  Transportation Needs: No Transportation Needs (08/02/2021)   PRAPARE - Hydrologist (Medical): No    Lack of Transportation (Non-Medical): No  Physical Activity: Not on file  Stress: Not on file  Social Connections: Not on file  Intimate Partner Violence: Not on file    FAMILY HISTORY: Family History  Problem Relation Age of Onset   Hypertension Mother    Cancer Sister        cervical   Breast cancer Paternal Aunt    Breast cancer Maternal Grandmother    Cancer Maternal Grandmother        Breast    Review of Systems   Constitutional:  Negative for appetite change, chills, fatigue, fever and unexpected weight change.  HENT:   Negative for hearing loss, lump/mass and trouble swallowing.   Eyes:  Negative for eye problems and icterus.  Respiratory:  Negative for chest tightness, cough and shortness of breath.   Cardiovascular:  Negative for chest pain, leg swelling and palpitations.  Gastrointestinal:  Negative for abdominal distention, abdominal pain, constipation, diarrhea, nausea and vomiting.  Endocrine: Negative for hot flashes.  Genitourinary:  Negative for difficulty urinating.   Musculoskeletal:  Negative for arthralgias.  Skin:  Negative for itching and rash.  Neurological:  Negative for dizziness, extremity weakness, headaches and numbness.  Hematological:  Negative for adenopathy. Does not bruise/bleed easily.  Psychiatric/Behavioral:  Negative for depression. The patient is not nervous/anxious.  PHYSICAL EXAMINATION  ECOG PERFORMANCE STATUS: 1 - Symptomatic but completely ambulatory  Vitals:   02/07/22 1456  BP: 125/81  Pulse: 68  Resp: 16  Temp: (!) 97.5 F (36.4 C)  SpO2: 100%    Physical Exam Constitutional:      Appearance: Normal appearance.  HENT:     Head: Normocephalic and atraumatic.  Eyes:     General: No scleral icterus. Chest:     Comments: Right breast status postmastectomy and reconstruction no sign of local recurrence left breast is benign. Abdominal:     General: There is no distension.     Tenderness: There is no abdominal tenderness.  Musculoskeletal:        General: No swelling.     Cervical back: Neck supple.  Lymphadenopathy:     Cervical: No cervical adenopathy.  Neurological:     Mental Status: She is alert.     LABORATORY DATA:  CBC    Component Value Date/Time   WBC 10.0 02/07/2022 1415   WBC 6.2 06/20/2021 0528   RBC 3.92 02/07/2022 1415   HGB 12.0 02/07/2022 1415   HGB 15.4 11/04/2021 1609   HCT 35.0 (L) 02/07/2022 1415   HCT  46.9 (H) 11/04/2021 1609   PLT 269 02/07/2022 1415   PLT 334 11/04/2021 1609   MCV 89.3 02/07/2022 1415   MCV 91 11/04/2021 1609   MCH 30.6 02/07/2022 1415   MCHC 34.3 02/07/2022 1415   RDW 13.6 02/07/2022 1415   RDW 13.0 11/04/2021 1609   LYMPHSABS 3.1 02/07/2022 1415   MONOABS 0.6 02/07/2022 1415   EOSABS 0.2 02/07/2022 1415   BASOSABS 0.0 02/07/2022 1415    CMP     Component Value Date/Time   NA 141 02/07/2022 1415   NA 144 11/04/2021 1609   K 3.3 (L) 02/07/2022 1415   CL 105 02/07/2022 1415   CO2 29 02/07/2022 1415   GLUCOSE 81 02/07/2022 1415   BUN 18 02/07/2022 1415   BUN 14 11/04/2021 1609   CREATININE 0.97 02/07/2022 1415   CALCIUM 9.6 02/07/2022 1415   PROT 6.2 (L) 02/07/2022 1415   PROT 7.6 11/04/2021 1609   ALBUMIN 4.3 02/07/2022 1415   ALBUMIN 5.4 (H) 11/04/2021 1609   AST 17 02/07/2022 1415   ALT 17 02/07/2022 1415   ALKPHOS 60 02/07/2022 1415   BILITOT 0.4 02/07/2022 1415   GFRNONAA >60 02/07/2022 1415   GFRAA >60 05/17/2017 1943   ASSESSMENT and THERAPY PLAN:   Malignant neoplasm of central portion of right breast in female, estrogen receptor positive ([facility]) This is a 51 year old premenopausal female patient with right breast invasive ductal carcinoma status post right mastectomy, final pathology showing 8 mm right breast IDC with associated DCIS, 4 sentinel nodes negative, prognostic showed ER 50% positive, PR positive, 5% weak intensity and HER2 negative with Ki-67 of 10%.  She has been receiving adjuvant ovarian suppression with Lupron and Aromasin.   She has tried Arimidex as well as letrozole in the past, could not tolerate because of arthralgias.  She is switching providers because of some change in insurance.   She continues on leuprolide and exemestane as recommended. Physical exam with no concerns except the small cluster of nodules, which were thought to be fat necrosis. Repeat US recommended. Bone density May 2023 normal, no evidence of  osteopenia or osteoporosis. She will proceed with the leuprolide every 28 days and RTC in approx 6 months. She was encouraged to contact us with any new questions or  concerns.  All questions were answered. The patient knows to call the clinic with any problems, questions or concerns. We can certainly see the patient much sooner if necessary.  Total encounter time:30 minutes*in face-to-face visit time, chart review, lab review, care coordination, order entry, and documentation of the encounter time.   *Total Encounter Time as defined by the Centers for Medicare and Medicaid Services includes, in addition to the face-to-face time of a patient visit (documented in the note above) non-face-to-face time: obtaining and reviewing outside history, ordering and reviewing medications, tests or procedures, care coordination (communications with other health care professionals or caregivers) and documentation in the medical record.

## 2022-02-09 ENCOUNTER — Encounter: Payer: Self-pay | Admitting: Hematology

## 2022-02-09 ENCOUNTER — Encounter: Payer: Self-pay | Admitting: Hematology and Oncology

## 2022-02-20 DIAGNOSIS — M545 Low back pain, unspecified: Secondary | ICD-10-CM | POA: Diagnosis not present

## 2022-02-20 DIAGNOSIS — M5416 Radiculopathy, lumbar region: Secondary | ICD-10-CM | POA: Diagnosis not present

## 2022-02-20 DIAGNOSIS — M1711 Unilateral primary osteoarthritis, right knee: Secondary | ICD-10-CM | POA: Insufficient documentation

## 2022-02-21 ENCOUNTER — Telehealth: Payer: Self-pay

## 2022-02-21 NOTE — Telephone Encounter (Signed)
Sent mychart msg. AS, CMA

## 2022-03-07 ENCOUNTER — Inpatient Hospital Stay: Payer: Medicaid Other | Attending: Hematology

## 2022-03-07 ENCOUNTER — Other Ambulatory Visit: Payer: Self-pay

## 2022-03-07 ENCOUNTER — Encounter: Payer: Self-pay | Admitting: Hematology

## 2022-03-07 VITALS — BP 144/85 | HR 65 | Temp 98.5°F | Resp 16

## 2022-03-07 DIAGNOSIS — Z170 Estrogen receptor positive status [ER+]: Secondary | ICD-10-CM | POA: Diagnosis not present

## 2022-03-07 DIAGNOSIS — C50111 Malignant neoplasm of central portion of right female breast: Secondary | ICD-10-CM | POA: Diagnosis not present

## 2022-03-07 MED ORDER — LEUPROLIDE ACETATE 3.75 MG IM KIT
3.7500 mg | PACK | Freq: Once | INTRAMUSCULAR | Status: AC
  Administered 2022-03-07: 3.75 mg via INTRAMUSCULAR
  Filled 2022-03-07: qty 3.75

## 2022-03-07 NOTE — Patient Instructions (Signed)
Leuprolide Solution for Injection What is this medication? LEUPROLIDE (loo PROE lide) reduces the symptoms of prostate cancer. It works by decreasing levels of the hormone testosterone in the body. This prevents prostate cancer cells from spreading or growing. This medicine may be used for other purposes; ask your health care provider or pharmacist if you have questions. COMMON BRAND NAME(S): Lupron What should I tell my care team before I take this medication? They need to know if you have any of these conditions: Diabetes Heart attack Heart disease High blood pressure High cholesterol Pain or difficulty passing urine Spinal cord metastasis Stroke Tobacco use An unusual or allergic reaction to leuprolide, other medications, foods, dyes, or preservatives Pregnant or trying to get pregnant Breast-feeding How should I use this medication? This medication is for injection under the skin or into a muscle. You will be taught how to prepare and give this medication. Use exactly as directed. Take your medication at regular intervals. Do not take it more often than directed. It is important that you put your used needles and syringes in a special sharps container. Do not put them in a trash can. If you do not have a sharps container, call your care team to get one. A special MedGuide will be given to you by the pharmacist with each prescription and refill. Be sure to read this information carefully each time. Talk to your care team about the use of this medication in children. While this medication may be prescribed for children as young as 8 years for selected conditions, precautions do apply. Overdosage: If you think you have taken too much of this medicine contact a poison control center or emergency room at once. NOTE: This medicine is only for you. Do not share this medicine with others. What if I miss a dose? If you miss a dose, take it as soon as you can. If it is almost time for your next  dose, take only that dose. Do not take double or extra doses. What may interact with this medication? Do not take this medication with any of the following: Chasteberry Cisapride Dronedarone Pimozide Thioridazine This medication may also interact with the following: Estrogen or progestin hormones Herbal or dietary supplements, like black cohosh or DHEA Other medications that cause heart rhythm changes Testosterone This list may not describe all possible interactions. Give your health care provider a list of all the medicines, herbs, non-prescription drugs, or dietary supplements you use. Also tell them if you smoke, drink alcohol, or use illegal drugs. Some items may interact with your medicine. What should I watch for while using this medication? Visit your care team for regular checks on your progress. During the first week, your symptoms may get worse, but then will improve as you continue your treatment. You may get hot flashes, increased bone pain, increased difficulty passing urine, or an aggravation of nerve symptoms. Discuss these effects with your care team, some of them may improve with continued use of this medication. Patients may experience a menstrual cycle or spotting during the first 2 months of therapy with this medication. If this continues, contact your care team. This medication may increase blood sugar. The risk may be higher in patients who already have diabetes. Ask your care team what you can do to lower your risk of diabetes while taking this medication. What side effects may I notice from receiving this medication? Side effects that you should report to your care team as soon as possible: Allergic reactions--skin rash,  itching, hives, swelling of the face, lips, tongue, or throat Heart attack--pain or tightness in the chest, shoulders, arms, or jaw, nausea, shortness of breath, cold or clammy skin, feeling faint or lightheaded Heart rhythm changes--fast or irregular  heartbeat, dizziness, feeling faint or lightheaded, chest pain, trouble breathing High blood sugar (hyperglycemia)--increased thirst or amount of urine, unusual weakness or fatigue, blurry vision Mood swings, irritability, hostility Seizures Stroke--sudden numbness or weakness of the face, arm, or leg, trouble speaking, confusion, trouble walking, loss of balance or coordination, dizziness, severe headache, change in vision Thoughts of suicide or self-harm, worsening mood, feelings of depression Side effects that usually do not require medical attention (report to your care team if they continue or are bothersome): Bone pain Change in sex drive or performance General discomfort and fatigue Hot flashes Muscle pain Pain, redness, or irritation at injection site Swelling of the ankles, hands, or feet This list may not describe all possible side effects. Call your doctor for medical advice about side effects. You may report side effects to FDA at 1-800-FDA-1088. Where should I keep my medication? Keep out of the reach of children and pets. Store below 25 degrees C (77 degrees F). Do not freeze. Protect from light. Get rid of any unused medication after the expiration date. To get rid of medications that are no longer needed or have expired: Take the medication to a medication take-back program. Check with your pharmacy or law enforcement to find a location. If you cannot return the medication, ask your pharmacist or care team how to get rid of this medication safely. NOTE: This sheet is a summary. It may not cover all possible information. If you have questions about this medicine, talk to your doctor, pharmacist, or health care provider.  2023 Elsevier/Gold Standard (2021-01-12 00:00:00)

## 2022-03-20 DIAGNOSIS — M1711 Unilateral primary osteoarthritis, right knee: Secondary | ICD-10-CM | POA: Diagnosis not present

## 2022-03-20 DIAGNOSIS — M545 Low back pain, unspecified: Secondary | ICD-10-CM | POA: Diagnosis not present

## 2022-03-27 ENCOUNTER — Encounter: Payer: Self-pay | Admitting: Hematology

## 2022-03-28 ENCOUNTER — Encounter: Payer: Self-pay | Admitting: Hematology

## 2022-04-04 ENCOUNTER — Inpatient Hospital Stay: Payer: Medicaid Other | Attending: Hematology

## 2022-04-04 VITALS — BP 128/82 | HR 56 | Temp 98.1°F | Resp 18

## 2022-04-04 DIAGNOSIS — Z170 Estrogen receptor positive status [ER+]: Secondary | ICD-10-CM | POA: Diagnosis not present

## 2022-04-04 DIAGNOSIS — C50111 Malignant neoplasm of central portion of right female breast: Secondary | ICD-10-CM | POA: Diagnosis not present

## 2022-04-04 DIAGNOSIS — Z79818 Long term (current) use of other agents affecting estrogen receptors and estrogen levels: Secondary | ICD-10-CM | POA: Insufficient documentation

## 2022-04-04 MED ORDER — LEUPROLIDE ACETATE 3.75 MG IM KIT
3.7500 mg | PACK | Freq: Once | INTRAMUSCULAR | Status: AC
  Administered 2022-04-04: 3.75 mg via INTRAMUSCULAR
  Filled 2022-04-04: qty 3.75

## 2022-04-11 ENCOUNTER — Ambulatory Visit: Payer: Medicaid Other | Admitting: Internal Medicine

## 2022-04-18 ENCOUNTER — Ambulatory Visit: Payer: Medicaid Other | Admitting: Internal Medicine

## 2022-04-29 ENCOUNTER — Other Ambulatory Visit: Payer: No Typology Code available for payment source

## 2022-04-30 DIAGNOSIS — M5416 Radiculopathy, lumbar region: Secondary | ICD-10-CM | POA: Insufficient documentation

## 2022-05-01 DIAGNOSIS — M533 Sacrococcygeal disorders, not elsewhere classified: Secondary | ICD-10-CM | POA: Diagnosis not present

## 2022-05-01 DIAGNOSIS — M5416 Radiculopathy, lumbar region: Secondary | ICD-10-CM | POA: Diagnosis not present

## 2022-05-01 DIAGNOSIS — M1711 Unilateral primary osteoarthritis, right knee: Secondary | ICD-10-CM | POA: Diagnosis not present

## 2022-05-01 DIAGNOSIS — M545 Low back pain, unspecified: Secondary | ICD-10-CM | POA: Diagnosis not present

## 2022-05-02 ENCOUNTER — Inpatient Hospital Stay: Payer: Medicaid Other | Attending: Hematology

## 2022-05-29 ENCOUNTER — Ambulatory Visit
Admission: RE | Admit: 2022-05-29 | Discharge: 2022-05-29 | Disposition: A | Discharge status: Home / Self Care | Payer: Medicaid Other | Source: Ambulatory Visit | Attending: Hematology and Oncology | Admitting: Hematology and Oncology

## 2022-05-29 ENCOUNTER — Ambulatory Visit
Admission: RE | Admit: 2022-05-29 | Discharge: 2022-05-29 | Disposition: A | Discharge status: Home / Self Care | Payer: No Typology Code available for payment source | Source: Ambulatory Visit | Attending: Hematology and Oncology | Admitting: Hematology and Oncology

## 2022-05-29 DIAGNOSIS — R922 Inconclusive mammogram: Secondary | ICD-10-CM | POA: Diagnosis not present

## 2022-05-29 DIAGNOSIS — Z170 Estrogen receptor positive status [ER+]: Secondary | ICD-10-CM

## 2022-05-30 ENCOUNTER — Inpatient Hospital Stay: Payer: Medicaid Other | Attending: Hematology

## 2022-05-30 ENCOUNTER — Other Ambulatory Visit: Payer: Self-pay

## 2022-05-30 VITALS — BP 142/94 | HR 54 | Temp 98.6°F | Resp 20

## 2022-05-30 DIAGNOSIS — C50111 Malignant neoplasm of central portion of right female breast: Secondary | ICD-10-CM | POA: Insufficient documentation

## 2022-05-30 DIAGNOSIS — Z79818 Long term (current) use of other agents affecting estrogen receptors and estrogen levels: Secondary | ICD-10-CM | POA: Diagnosis not present

## 2022-05-30 DIAGNOSIS — Z170 Estrogen receptor positive status [ER+]: Secondary | ICD-10-CM

## 2022-05-30 MED ORDER — LEUPROLIDE ACETATE 3.75 MG IM KIT
3.7500 mg | PACK | Freq: Once | INTRAMUSCULAR | Status: AC
  Administered 2022-05-30: 3.75 mg via INTRAMUSCULAR
  Filled 2022-05-30: qty 3.75

## 2022-06-14 ENCOUNTER — Other Ambulatory Visit: Payer: Self-pay | Admitting: Hematology and Oncology

## 2022-06-17 ENCOUNTER — Encounter: Payer: Self-pay | Admitting: Urology

## 2022-06-18 ENCOUNTER — Ambulatory Visit: Payer: Medicaid Other | Admitting: Urology

## 2022-06-27 ENCOUNTER — Inpatient Hospital Stay: Payer: Medicaid Other | Attending: Hematology

## 2022-06-27 ENCOUNTER — Ambulatory Visit (INDEPENDENT_AMBULATORY_CARE_PROVIDER_SITE_OTHER)
Admission: EM | Admit: 2022-06-27 | Discharge: 2022-06-27 | Disposition: A | Discharge status: Home / Self Care | Payer: Medicaid Other | Source: Home / Self Care | Attending: Family Medicine | Admitting: Family Medicine

## 2022-06-27 VITALS — BP 115/77 | HR 59 | Temp 98.3°F | Resp 20

## 2022-06-27 DIAGNOSIS — N898 Other specified noninflammatory disorders of vagina: Secondary | ICD-10-CM | POA: Insufficient documentation

## 2022-06-27 DIAGNOSIS — Z79818 Long term (current) use of other agents affecting estrogen receptors and estrogen levels: Secondary | ICD-10-CM | POA: Diagnosis not present

## 2022-06-27 DIAGNOSIS — C50111 Malignant neoplasm of central portion of right female breast: Secondary | ICD-10-CM | POA: Diagnosis not present

## 2022-06-27 DIAGNOSIS — Z170 Estrogen receptor positive status [ER+]: Secondary | ICD-10-CM | POA: Diagnosis not present

## 2022-06-27 DIAGNOSIS — N3090 Cystitis, unspecified without hematuria: Secondary | ICD-10-CM | POA: Insufficient documentation

## 2022-06-27 LAB — POCT URINALYSIS DIP (MANUAL ENTRY)
Bilirubin, UA: NEGATIVE
Glucose, UA: NEGATIVE mg/dL
Ketones, POC UA: NEGATIVE mg/dL
Nitrite, UA: NEGATIVE
Protein Ur, POC: NEGATIVE mg/dL
Spec Grav, UA: 1.015 (ref 1.010–1.025)
Urobilinogen, UA: 0.2 E.U./dL
pH, UA: 6 (ref 5.0–8.0)

## 2022-06-27 MED ORDER — LEUPROLIDE ACETATE 3.75 MG IM KIT
3.7500 mg | PACK | Freq: Once | INTRAMUSCULAR | Status: AC
  Administered 2022-06-27: 3.75 mg via INTRAMUSCULAR
  Filled 2022-06-27: qty 3.75

## 2022-06-27 MED ORDER — CIPROFLOXACIN HCL 500 MG PO TABS: 500.0000 mg | ORAL_TABLET | Freq: Two times a day (BID) | ORAL | 0 refills | Status: AC

## 2022-06-27 NOTE — Discharge Instructions (Addendum)
Your urine has red cells and white blood cells on it, this could be a sign of infection in the bladder  Take Cipro 500 mg--1 tablet 2 times daily for 7 days  Urine culture is sent, and staff will notify you if there is anything showing on the culture that shows you need a different antibiotic  Also staff will notify if there is anything positive on the swab that was done today

## 2022-06-27 NOTE — ED Provider Notes (Signed)
[name]    CSN: [id number] Arrival date & time: 06/27/22  1654      History   Chief Complaint Chief Complaint  Patient presents with   Urinary Frequency    HPI Melissa Shepherd is a 52 y.o. female.    Urinary Frequency   Here for pelvic pressure and urinary frequency and incomplete bladder emptying.  Symptoms began 1 week ago.  She also notes a little itching  Initially she told staff that she had not been sexually active, but she has been sexually active since she left the vaginal swab here. No fever or chills, but she does have hot and cold flashes from menopause   No nausea or vomiting   Past Medical History:  Diagnosis Date   Anginal pain (HCC)    Anxiety    panic attacks   Cancer (HCC)    Chicken pox    Cholelithiasis with acute on chronic cholecystitis without biliary obstruction 11/26/2016   Depression    Diarrhea    Dyspnea    with chest pain   Fatty liver    Frequent headaches    Hypertension    IBS (irritable bowel syndrome)    Multiple thyroid nodules    Seizures (HCC)    pregenancy- toxemia- 1991    Patient Active Problem List   Diagnosis Date Noted   Chronic radicular lumbar pain 10/11/2021   Lumbar facet arthropathy 10/11/2021   Lumbar degenerative disc disease 10/11/2021   Chronic pain syndrome 10/11/2021   Lymphedema 06/27/2021   Cellulitis 06/20/2021   Open abdominal wall wound 06/19/2021   Hypokalemia 06/19/2021   Lesion of left native kidney 06/19/2021   Abdominal wall cellulitis 06/18/2021   Pain and swelling of lower leg 04/25/2021   Cellulitis and abscess of left lower extremity 03/31/2021   Malignant neoplasm of central portion of right breast in female, estrogen receptor positive (HCC) 03/06/2021   Osteoarthritis of knee 03/19/2017   Cholelithiasis with acute on chronic cholecystitis without biliary obstruction 11/26/2016   Anxiety 08/18/2015   Mixed hyperlipidemia 08/18/2015   Vitamin D deficiency 08/18/2015    IBS (irritable bowel syndrome) 05/01/2015   Lumbago 10/06/2014   Bilateral thoracic back pain 10/06/2014   Chest pain 07/09/2014   Leg swelling 07/09/2014   Essential hypertension 06/11/2014   Depression 06/11/2014   Tobacco use disorder 06/11/2014   History of abnormal cervical Pap smear 05/11/2013    Past Surgical History:  Procedure Laterality Date   CESAREAN SECTION  1991. 2003.   2   CHOLECYSTECTOMY N/A 11/26/2016   Procedure: LAPAROSCOPIC CHOLECYSTECTOMY WITH INTRAOPERATIVE CHOLANGIOGRAM;  Surgeon: Claud Kelp, MD;  Location: [facility] OR;  Service: General;  Laterality: N/A;   MASTECTOMY Right    WISDOM TOOTH EXTRACTION      OB History   No obstetric history on file.      Home Medications    Prior to Admission medications   Medication Sig Start Date End Date Taking? Authorizing Provider  ciprofloxacin (CIPRO) 500 MG tablet Take 1 tablet (500 mg total) by mouth 2 (two) times daily for 7 days. 06/27/22 07/04/22 Yes Makana Feigel, Janace Aris, MD  ALPRAZolam Prudy Feeler) 1 MG tablet Take 1 mg by mouth 3 (three) times daily.    [provider]  amphetamine-dextroamphetamine (ADDERALL) 20 MG tablet Take 20 mg by mouth 2 (two) times daily. 12/23/17   [provider]  aspirin 81 MG EC tablet Take 1 tablet (81 mg total) by mouth daily. Swallow whole. 06/22/21  Meredeth Ide, MD  colestipol (COLESTID) 1 g tablet Take 1-2 g by mouth 2 (two) times daily. 02/01/21   [provider]  exemestane (AROMASIN) 25 MG tablet TAKE 1 TABLET (25 MG TOTAL) BY MOUTH DAILY. 06/14/22   Rachel Moulds, MD  folic acid (FOLVITE) 1 MG tablet Take 1 mg by mouth at bedtime. 12/29/20   [provider]  furosemide (LASIX) 20 MG tablet Take 20 mg by mouth daily. 09/10/21   [provider]  gabapentin (NEURONTIN) 300 MG capsule Take 300 mg by mouth 3 (three) times daily. 02/13/21   [provider]  hydrochlorothiazide (HYDRODIURIL) 12.5 MG tablet TAKE 1 TABLET BY MOUTH  DAILY. Patient taking differently: Take 12.5 mg by mouth daily. 11/18/16   Glori Luis, MD  meloxicam (MOBIC) 15 MG tablet Take 15 mg by mouth daily. 09/27/21   [provider]  metoprolol succinate (TOPROL-XL) 100 MG 24 hr tablet Take 100 mg by mouth daily. 03/12/21   [provider]  mupirocin ointment (BACTROBAN) 2 % Apply 1 application. topically 3 (three) times daily. As directed for 5 days 07/13/21   Judyann Munson, MD  nystatin (MYCOSTATIN) 100000 UNIT/ML suspension Take 5 mLs (500,000 Units total) by mouth 4 (four) times daily. 11/04/21   Gustavus Bryant, FNP  potassium chloride SA (KLOR-CON M) 20 MEQ tablet Take 20 mEq by mouth at bedtime. 03/09/21   [provider]  rosuvastatin (CRESTOR) 40 MG tablet Take 40 mg by mouth at bedtime.    [provider]  traMADol (ULTRAM) 50 MG tablet Take 50-100 mg by mouth every 8 (eight) hours as needed. 10/05/21   [provider]    Family History Family History  Problem Relation Age of Onset   Hypertension Mother    Cancer Sister        cervical   Breast cancer Paternal Aunt    Breast cancer Maternal Grandmother    Cancer Maternal Grandmother        Breast    Social History Social History   Tobacco Use   Smoking status: Every Day    Packs/day: 0.25    Years: 31.00    Additional pack years: 0.00    Total pack years: 7.75    Types: Cigarettes   Smokeless tobacco: Never  Vaping Use   Vaping Use: Never used  Substance Use Topics   Alcohol use: Not Currently    Alcohol/week: 2.0 standard drinks of alcohol    Types: 2 Cans of beer per week   Drug use: No     Allergies   Ezetimibe and Prednisone   Review of Systems Review of Systems  Genitourinary:  Positive for frequency.     Physical Exam Triage Vital Signs ED Triage Vitals [06/27/22 1758]  Enc Vitals Group     BP 120/78     Pulse Rate 69     Resp 16     Temp 98.5 F (36.9 C)     Temp Source Oral     SpO2 96 %      Weight      Height      Head Circumference      Peak Flow      Pain Score 0     Pain Loc      Pain Edu?      Excl. in GC?    No data found.  Updated Vital Signs BP 120/78 (BP Location: Left Arm)   Pulse 69   Temp  98.5 F (36.9 C) (Oral)   Resp 16   LMP 05/08/2017 (Approximate) Comment: neg preg test  SpO2 96%   Visual Acuity Right Eye Distance:   Left Eye Distance:   Bilateral Distance:    Right Eye Near:   Left Eye Near:    Bilateral Near:     Physical Exam Vitals reviewed.  Constitutional:      General: She is not in acute distress.    Appearance: She is not toxic-appearing.  HENT:     Nose: Nose normal.     Mouth/Throat:     Mouth: Mucous membranes are moist.  Eyes:     Extraocular Movements: Extraocular movements intact.     Conjunctiva/sclera: Conjunctivae normal.     Pupils: Pupils are equal, round, and reactive to light.  Cardiovascular:     Rate and Rhythm: Normal rate and regular rhythm.     Heart sounds: No murmur heard. Pulmonary:     Effort: Pulmonary effort is normal. No respiratory distress.     Breath sounds: No stridor. No wheezing, rhonchi or rales.  Abdominal:     General: There is no distension.     Palpations: Abdomen is soft. There is no mass.     Tenderness: There is no abdominal tenderness. There is no guarding.  Musculoskeletal:     Cervical back: Neck supple.  Lymphadenopathy:     Cervical: No cervical adenopathy.  Skin:    Capillary Refill: Capillary refill takes less than 2 seconds.     Coloration: Skin is not jaundiced or pale.  Neurological:     General: No focal deficit present.     Mental Status: She is alert and oriented to person, place, and time.  Psychiatric:        Behavior: Behavior normal.      UC Treatments / Results  Labs (all labs ordered are listed, but only abnormal results are displayed) Labs Reviewed  POCT URINALYSIS DIP (MANUAL ENTRY) - Abnormal; Notable for the following components:      Result  Value   Blood, UA trace-intact (*)    Leukocytes, UA Small (1+) (*)    All other components within normal limits  URINE CULTURE  CERVICOVAGINAL ANCILLARY ONLY    EKG   Radiology No results found.  Procedures Procedures (including critical care time)  Medications Ordered in UC Medications - No data to display  Initial Impression / Assessment and Plan / UC Course  I have reviewed the triage vital signs and the nursing notes.  Pertinent labs & imaging results that were available during my care of the patient were reviewed by me and considered in my medical decision making (see chart for details).        Urinalysis shows some white cells and red cells.  Cipro was sent in for probable cystitis.  Urine culture is sent and we will notify her if antibiotic needs to be changed.  Due to the itching and stated concern about possible exposure to infection, vaginal self swab was done today.  Will notify her of any positives on that and treat per protocol Final Clinical Impressions(s) / UC Diagnoses   Final diagnoses:  Cystitis  Vaginal itching     Discharge Instructions      Your urine has red cells and white blood cells on it, this could be a sign of infection in the bladder  Take Cipro 500 mg--1 tablet 2 times daily for 7 days  Urine culture is sent, and staff will  notify you if there is anything showing on the culture that shows you need a different antibiotic  Also staff will notify if there is anything positive on the swab that was done today       ED Prescriptions     Medication Sig Dispense Auth. Provider   ciprofloxacin (CIPRO) 500 MG tablet Take 1 tablet (500 mg total) by mouth 2 (two) times daily for 7 days. 14 tablet Tyriek Hofman, Janace Aris, MD      PDMP not reviewed this encounter.   Zenia Resides, MD 06/27/22 [phone]

## 2022-06-27 NOTE — ED Triage Notes (Signed)
Patient with c/o UTI sx. States it started a week ago and it "just feels weird down there". Patient denies taking any OTC medication and states she has not been sexually active. Denies burning but states she has to void frequently.

## 2022-06-28 LAB — CERVICOVAGINAL ANCILLARY ONLY
Bacterial Vaginitis (gardnerella): POSITIVE — AB
Candida Glabrata: NEGATIVE
Candida Vaginitis: NEGATIVE
Chlamydia: NEGATIVE
Comment: NEGATIVE
Comment: NEGATIVE
Comment: NEGATIVE
Comment: NEGATIVE
Comment: NEGATIVE
Comment: NORMAL
Neisseria Gonorrhea: NEGATIVE
Trichomonas: NEGATIVE

## 2022-06-28 LAB — URINE CULTURE

## 2022-06-29 LAB — URINE CULTURE: Culture: 40000 — AB

## 2022-06-30 ENCOUNTER — Telehealth (HOSPITAL_COMMUNITY): Payer: Self-pay | Admitting: Emergency Medicine

## 2022-06-30 MED ORDER — METRONIDAZOLE 500 MG PO TABS
500.0000 mg | ORAL_TABLET | Freq: Two times a day (BID) | ORAL | 0 refills | Status: DC
Start: 2022-06-30 — End: 2022-08-22

## 2022-07-02 ENCOUNTER — Encounter: Payer: Self-pay | Admitting: Hematology

## 2022-07-12 DIAGNOSIS — H5213 Myopia, bilateral: Secondary | ICD-10-CM | POA: Diagnosis not present

## 2022-07-16 DIAGNOSIS — C50111 Malignant neoplasm of central portion of right female breast: Secondary | ICD-10-CM | POA: Diagnosis not present

## 2022-07-16 DIAGNOSIS — E860 Dehydration: Secondary | ICD-10-CM | POA: Insufficient documentation

## 2022-07-16 DIAGNOSIS — Z170 Estrogen receptor positive status [ER+]: Secondary | ICD-10-CM | POA: Diagnosis not present

## 2022-07-24 ENCOUNTER — Other Ambulatory Visit: Payer: Self-pay | Admitting: *Deleted

## 2022-07-24 DIAGNOSIS — Z170 Estrogen receptor positive status [ER+]: Secondary | ICD-10-CM

## 2022-07-25 ENCOUNTER — Inpatient Hospital Stay: Payer: Medicaid Other

## 2022-07-25 ENCOUNTER — Other Ambulatory Visit: Payer: Self-pay

## 2022-07-25 ENCOUNTER — Inpatient Hospital Stay: Payer: Medicaid Other | Admitting: Hematology and Oncology

## 2022-07-25 VITALS — BP 128/78 | HR 67 | Temp 98.2°F | Resp 18

## 2022-07-25 DIAGNOSIS — Z170 Estrogen receptor positive status [ER+]: Secondary | ICD-10-CM

## 2022-07-25 DIAGNOSIS — C50111 Malignant neoplasm of central portion of right female breast: Secondary | ICD-10-CM | POA: Diagnosis not present

## 2022-07-25 DIAGNOSIS — N898 Other specified noninflammatory disorders of vagina: Secondary | ICD-10-CM | POA: Diagnosis not present

## 2022-07-25 DIAGNOSIS — Z79818 Long term (current) use of other agents affecting estrogen receptors and estrogen levels: Secondary | ICD-10-CM | POA: Diagnosis not present

## 2022-07-25 DIAGNOSIS — N3090 Cystitis, unspecified without hematuria: Secondary | ICD-10-CM | POA: Diagnosis not present

## 2022-07-25 MED ORDER — LEUPROLIDE ACETATE 3.75 MG IM KIT
3.7500 mg | PACK | Freq: Once | INTRAMUSCULAR | Status: AC
  Administered 2022-07-25: 3.75 mg via INTRAMUSCULAR
  Filled 2022-07-25: qty 3.75

## 2022-07-30 ENCOUNTER — Telehealth: Payer: Self-pay | Admitting: *Deleted

## 2022-07-30 NOTE — Telephone Encounter (Signed)
Pt requested for MD appts on 07/25/2022 to be canceled - requested injection only.  Per call from scheduler per RN request to reschedule missed provider appointment - pt informed her that she is now being followed by her previous oncologist.  This RN reviewed Care Everywhere and note pt has appointments (and has had) with Dr Welton Flakes with note for lupron injections to be scheduled at her office in the [facility] clinic.  This note will be forwarded to MD for review for discontinuance of care at this office per above.

## 2022-08-01 DIAGNOSIS — Z170 Estrogen receptor positive status [ER+]: Secondary | ICD-10-CM | POA: Diagnosis not present

## 2022-08-01 DIAGNOSIS — C50111 Malignant neoplasm of central portion of right female breast: Secondary | ICD-10-CM | POA: Diagnosis not present

## 2022-08-01 DIAGNOSIS — N281 Cyst of kidney, acquired: Secondary | ICD-10-CM | POA: Diagnosis not present

## 2022-08-22 ENCOUNTER — Ambulatory Visit
Admission: EM | Admit: 2022-08-22 | Discharge: 2022-08-22 | Disposition: A | Discharge status: Home / Self Care | Payer: Medicaid Other | Attending: Family Medicine | Admitting: Family Medicine

## 2022-08-22 DIAGNOSIS — R42 Dizziness and giddiness: Secondary | ICD-10-CM | POA: Diagnosis not present

## 2022-08-22 DIAGNOSIS — Z170 Estrogen receptor positive status [ER+]: Secondary | ICD-10-CM | POA: Diagnosis not present

## 2022-08-22 DIAGNOSIS — R290 Tetany: Secondary | ICD-10-CM | POA: Diagnosis not present

## 2022-08-22 DIAGNOSIS — C50111 Malignant neoplasm of central portion of right female breast: Secondary | ICD-10-CM | POA: Diagnosis not present

## 2022-08-22 DIAGNOSIS — K121 Other forms of stomatitis: Secondary | ICD-10-CM

## 2022-08-22 MED ORDER — VALACYCLOVIR HCL 1 G PO TABS: 2000.0000 mg | ORAL_TABLET | Freq: Two times a day (BID) | ORAL | 0 refills | Status: AC

## 2022-08-22 NOTE — Discharge Instructions (Addendum)
We have drawn blood to check your blood counts and sodium and potassium and calcium.  Staff will notify you if anything is significantly abnormal.  Your blood pressure here was 94/62 which is about 30 points lower on the top number than you usually run.  Please do not take your blood pressure medications until you see your top number going around 120 or higher.  Follow-up with your primary care about this issue  Make sure you are drinking enough fluids and eating some saltier foods for the next 2 to 3 days.  Take valacyclovir 1000 mg--2 tablets by mouth every 12 hours for 2 doses.  This is for possible oral fever blister.  If you feel worse or your dizziness is worse, then please proceed to the emergency room for further evaluation

## 2022-08-22 NOTE — ED Triage Notes (Signed)
Pt states she has a sore on the tip of her tongue since yesterday.  State she has felt lightheaded for the past 3 days.

## 2022-08-22 NOTE — ED Provider Notes (Addendum)
[name]    CSN: [id number] Arrival date & time: 08/22/22  1712      History   Chief Complaint Chief Complaint  Patient presents with   Mouth Lesions    HPI Melissa Shepherd is a 52 y.o. female.    Mouth Lesions  Here for dizziness has been going on for about 3 days.  She has had episode or 2 of her hands spasming at the wrists and flexing at the wrist.  She is also had her mouth lock in position for a few moments during this time. No vomiting or diarrhea and no fever.  She also was noted today a blister on the tip of her tongue.  No other rash.  No upper respiratory symptoms or cough.  She does get Lupron injections but she has been getting them monthly for about 2 years.  Her grandchildren had hand-foot-and-mouth disease about 3 weeks ago.    Past Medical History:  Diagnosis Date   Anginal pain (HCC)    Anxiety    panic attacks   Cancer (HCC)    Chicken pox    Cholelithiasis with acute on chronic cholecystitis without biliary obstruction 11/26/2016   Depression    Diarrhea    Dyspnea    with chest pain   Fatty liver    Frequent headaches    Hypertension    IBS (irritable bowel syndrome)    Multiple thyroid nodules    Seizures (HCC)    pregenancy- toxemia- 1991    Patient Active Problem List   Diagnosis Date Noted   Chronic radicular lumbar pain 10/11/2021   Lumbar facet arthropathy 10/11/2021   Lumbar degenerative disc disease 10/11/2021   Chronic pain syndrome 10/11/2021   Lymphedema 06/27/2021   Cellulitis 06/20/2021   Open abdominal wall wound 06/19/2021   Hypokalemia 06/19/2021   Lesion of left native kidney 06/19/2021   Abdominal wall cellulitis 06/18/2021   Pain and swelling of lower leg 04/25/2021   Cellulitis and abscess of left lower extremity 03/31/2021   Malignant neoplasm of central portion of right breast in female, estrogen receptor positive (HCC) 03/06/2021   Osteoarthritis of knee 03/19/2017   Cholelithiasis with  acute on chronic cholecystitis without biliary obstruction 11/26/2016   Anxiety 08/18/2015   Mixed hyperlipidemia 08/18/2015   Vitamin D deficiency 08/18/2015   IBS (irritable bowel syndrome) 05/01/2015   Lumbago 10/06/2014   Bilateral thoracic back pain 10/06/2014   Chest pain 07/09/2014   Leg swelling 07/09/2014   Essential hypertension 06/11/2014   Depression 06/11/2014   Tobacco use disorder 06/11/2014   History of abnormal cervical Pap smear 05/11/2013    Past Surgical History:  Procedure Laterality Date   CESAREAN SECTION  1991. 2003.   2   CHOLECYSTECTOMY N/A 11/26/2016   Procedure: LAPAROSCOPIC CHOLECYSTECTOMY WITH INTRAOPERATIVE CHOLANGIOGRAM;  Surgeon: Claud Kelp, MD;  Location: [facility] OR;  Service: General;  Laterality: N/A;   MASTECTOMY Right    WISDOM TOOTH EXTRACTION      OB History   No obstetric history on file.      Home Medications    Prior to Admission medications   Medication Sig Start Date End Date Taking? Authorizing Provider  valACYclovir (VALTREX) 1000 MG tablet Take 2 tablets (2,000 mg total) by mouth 2 (two) times daily for 2 doses. 08/22/22 08/23/22 Yes Gustie Bobb, Janace Aris, MD  ALPRAZolam Prudy Feeler) 1 MG tablet Take 1 mg by mouth 3 (three) times daily.    [provider]  amphetamine-dextroamphetamine (ADDERALL)  20 MG tablet Take 20 mg by mouth 2 (two) times daily. 12/23/17   [provider]  aspirin 81 MG EC tablet Take 1 tablet (81 mg total) by mouth daily. Swallow whole. 06/22/21   Meredeth Ide, MD  colestipol (COLESTID) 1 g tablet Take 1-2 g by mouth 2 (two) times daily. 02/01/21   [provider]  exemestane (AROMASIN) 25 MG tablet TAKE 1 TABLET (25 MG TOTAL) BY MOUTH DAILY. 06/14/22   Rachel Moulds, MD  folic acid (FOLVITE) 1 MG tablet Take 1 mg by mouth at bedtime. 12/29/20   [provider]  furosemide (LASIX) 20 MG tablet Take 20 mg by mouth daily. 09/10/21   [provider]  gabapentin (NEURONTIN)  300 MG capsule Take 300 mg by mouth 3 (three) times daily. 02/13/21   [provider]  hydrochlorothiazide (HYDRODIURIL) 12.5 MG tablet TAKE 1 TABLET BY MOUTH DAILY. Patient taking differently: Take 12.5 mg by mouth daily. 11/18/16   Glori Luis, MD  meloxicam (MOBIC) 15 MG tablet Take 15 mg by mouth daily. 09/27/21   [provider]  metoprolol succinate (TOPROL-XL) 100 MG 24 hr tablet Take 100 mg by mouth daily. 03/12/21   [provider]  mupirocin ointment (BACTROBAN) 2 % Apply 1 application. topically 3 (three) times daily. As directed for 5 days 07/13/21   Judyann Munson, MD  nystatin (MYCOSTATIN) 100000 UNIT/ML suspension Take 5 mLs (500,000 Units total) by mouth 4 (four) times daily. 11/04/21   Gustavus Bryant, FNP  potassium chloride SA (KLOR-CON M) 20 MEQ tablet Take 20 mEq by mouth at bedtime. 03/09/21   [provider]  rosuvastatin (CRESTOR) 40 MG tablet Take 40 mg by mouth at bedtime.    [provider]  traMADol (ULTRAM) 50 MG tablet Take 50-100 mg by mouth every 8 (eight) hours as needed. 10/05/21   [provider]    Family History Family History  Problem Relation Age of Onset   Hypertension Mother    Cancer Sister        cervical   Breast cancer Paternal Aunt    Breast cancer Maternal Grandmother    Cancer Maternal Grandmother        Breast    Social History Social History   Tobacco Use   Smoking status: Every Day    Packs/day: 0.25    Years: 31.00    Additional pack years: 0.00    Total pack years: 7.75    Types: Cigarettes   Smokeless tobacco: Never  Vaping Use   Vaping Use: Never used  Substance Use Topics   Alcohol use: Not Currently    Alcohol/week: 2.0 standard drinks of alcohol    Types: 2 Cans of beer per week   Drug use: No     Allergies   Ezetimibe and Prednisone   Review of Systems Review of Systems  HENT:  Positive for mouth sores.      Physical Exam Triage Vital Signs ED Triage  Vitals  Enc Vitals Group     BP 08/22/22 1717 94/62     Pulse Rate 08/22/22 1717 71     Resp 08/22/22 1717 16     Temp 08/22/22 1717 98.1 F (36.7 C)     Temp Source 08/22/22 1717 Oral     SpO2 08/22/22 1717 97 %     Weight --      Height --      Head Circumference --      Peak  Flow --      Pain Score 08/22/22 1719 8     Pain Loc --      Pain Edu? --      Excl. in GC? --    No data found.  Updated Vital Signs BP 94/62 (BP Location: Left Arm)   Pulse 71   Temp 98.1 F (36.7 C) (Oral)   Resp 16   LMP 05/08/2017 (Approximate) Comment: neg preg test  SpO2 97%   Visual Acuity Right Eye Distance:   Left Eye Distance:   Bilateral Distance:    Right Eye Near:   Left Eye Near:    Bilateral Near:     Physical Exam Vitals reviewed.  Constitutional:      General: She is not in acute distress.    Appearance: She is not ill-appearing, toxic-appearing or diaphoretic.  HENT:     Mouth/Throat:     Mouth: Mucous membranes are moist.     Comments: There is an ulceration on the tip of her tongue about 5 mm in diameter.  There is no erythema or induration around it; the edges are not raised. Eyes:     Extraocular Movements: Extraocular movements intact.     Pupils: Pupils are equal, round, and reactive to light.  Cardiovascular:     Rate and Rhythm: Normal rate and regular rhythm.     Heart sounds: No murmur heard. Pulmonary:     Effort: Pulmonary effort is normal.     Breath sounds: Normal breath sounds.  Skin:    Coloration: Skin is not jaundiced or pale.  Neurological:     General: No focal deficit present.     Mental Status: She is alert and oriented to person, place, and time.  Psychiatric:        Behavior: Behavior normal.      UC Treatments / Results  Labs (all labs ordered are listed, but only abnormal results are displayed) Labs Reviewed  CBC  BASIC METABOLIC PANEL    EKG   Radiology No results found.  Procedures Procedures (including critical  care time)  Medications Ordered in UC Medications - No data to display  Initial Impression / Assessment and Plan / UC Course  I have reviewed the triage vital signs and the nursing notes.  Pertinent labs & imaging results that were available during my care of the patient were reviewed by me and considered in my medical decision making (see chart for details).        I have asked her not to take her blood pressure medications until she sees her blood pressure coming up around 120 or more on the top.  She does have a cuff to check her blood pressure at home.  She is going to try to orally hydrate and I discussed with her trying to get some extra electrolytes in.  Valtrex is sent in in case that is an oral herpes lesion.  I do wonder still if possibly it is hand-foot-and-mouth disease causing that lesion.  I have asked her to follow-up with her primary care about this issue  If she feels any worse she is to present to the emergency room.  CBC and BMP are drawn today we will notify her of anything is significantly abnormal.   Staff are unable to access her veins for the blood work.  We are going to cancel those orders, and if she worsens anyway she is to go to the emergency room. Final Clinical Impressions(s) / UC Diagnoses  Final diagnoses:  Carpopedal spasm  Dizziness  Oral ulcer     Discharge Instructions      We have drawn blood to check your blood counts and sodium and potassium and calcium.  Staff will notify you if anything is significantly abnormal.  Your blood pressure here was 94/62 which is about 30 points lower on the top number than you usually run.  Please do not take your blood pressure medications until you see your top number going around 120 or higher.  Follow-up with your primary care about this issue  Make sure you are drinking enough fluids and eating some saltier foods for the next 2 to 3 days.  Take valacyclovir 1000 mg--2 tablets by mouth every  12 hours for 2 doses.  This is for possible oral fever blister.  If you feel worse or your dizziness is worse, then please proceed to the emergency room for further evaluation     ED Prescriptions     Medication Sig Dispense Auth. Provider   valACYclovir (VALTREX) 1000 MG tablet Take 2 tablets (2,000 mg total) by mouth 2 (two) times daily for 2 doses. 4 tablet Taye Cato, Janace Aris, MD      PDMP not reviewed this encounter.   Zenia Resides, MD 08/22/22 1741    Zenia Resides, MD 08/22/22 [phone]

## 2022-09-05 ENCOUNTER — Encounter: Payer: Self-pay | Admitting: Hematology

## 2022-09-18 DIAGNOSIS — I10 Essential (primary) hypertension: Secondary | ICD-10-CM | POA: Diagnosis not present

## 2022-09-18 DIAGNOSIS — R0602 Shortness of breath: Secondary | ICD-10-CM | POA: Diagnosis not present

## 2022-09-18 DIAGNOSIS — R079 Chest pain, unspecified: Secondary | ICD-10-CM | POA: Diagnosis not present

## 2022-09-18 DIAGNOSIS — E6609 Other obesity due to excess calories: Secondary | ICD-10-CM | POA: Diagnosis not present

## 2022-09-18 DIAGNOSIS — E782 Mixed hyperlipidemia: Secondary | ICD-10-CM | POA: Diagnosis not present

## 2022-09-18 DIAGNOSIS — R600 Localized edema: Secondary | ICD-10-CM | POA: Diagnosis not present

## 2022-09-19 DIAGNOSIS — C50111 Malignant neoplasm of central portion of right female breast: Secondary | ICD-10-CM | POA: Diagnosis not present

## 2022-09-19 DIAGNOSIS — Z170 Estrogen receptor positive status [ER+]: Secondary | ICD-10-CM | POA: Diagnosis not present

## 2022-10-17 ENCOUNTER — Other Ambulatory Visit: Payer: Self-pay | Admitting: Internal Medicine

## 2022-10-17 ENCOUNTER — Ambulatory Visit
Admission: RE | Admit: 2022-10-17 | Discharge: 2022-10-17 | Disposition: A | Discharge status: Home / Self Care | Payer: Self-pay | Source: Ambulatory Visit | Attending: Internal Medicine | Admitting: Internal Medicine

## 2022-10-17 DIAGNOSIS — Z170 Estrogen receptor positive status [ER+]: Secondary | ICD-10-CM | POA: Diagnosis not present

## 2022-10-17 DIAGNOSIS — I10 Essential (primary) hypertension: Secondary | ICD-10-CM | POA: Diagnosis not present

## 2022-10-17 DIAGNOSIS — R5383 Other fatigue: Secondary | ICD-10-CM | POA: Diagnosis not present

## 2022-10-17 DIAGNOSIS — R0602 Shortness of breath: Secondary | ICD-10-CM

## 2022-10-17 DIAGNOSIS — F329 Major depressive disorder, single episode, unspecified: Secondary | ICD-10-CM | POA: Diagnosis not present

## 2022-10-17 DIAGNOSIS — D649 Anemia, unspecified: Secondary | ICD-10-CM | POA: Diagnosis not present

## 2022-10-17 DIAGNOSIS — J449 Chronic obstructive pulmonary disease, unspecified: Secondary | ICD-10-CM | POA: Diagnosis not present

## 2022-10-17 DIAGNOSIS — R5381 Other malaise: Secondary | ICD-10-CM | POA: Diagnosis not present

## 2022-10-17 DIAGNOSIS — R799 Abnormal finding of blood chemistry, unspecified: Secondary | ICD-10-CM | POA: Diagnosis not present

## 2022-10-17 DIAGNOSIS — F17210 Nicotine dependence, cigarettes, uncomplicated: Secondary | ICD-10-CM | POA: Diagnosis not present

## 2022-10-17 DIAGNOSIS — E559 Vitamin D deficiency, unspecified: Secondary | ICD-10-CM | POA: Diagnosis not present

## 2022-10-17 DIAGNOSIS — R079 Chest pain, unspecified: Secondary | ICD-10-CM

## 2022-10-17 DIAGNOSIS — M479 Spondylosis, unspecified: Secondary | ICD-10-CM | POA: Diagnosis not present

## 2022-10-17 DIAGNOSIS — C50111 Malignant neoplasm of central portion of right female breast: Secondary | ICD-10-CM | POA: Diagnosis not present

## 2022-10-17 DIAGNOSIS — E8881 Metabolic syndrome: Secondary | ICD-10-CM | POA: Diagnosis not present

## 2022-10-17 DIAGNOSIS — M6530 Trigger finger, unspecified finger: Secondary | ICD-10-CM | POA: Diagnosis not present

## 2022-10-17 DIAGNOSIS — E785 Hyperlipidemia, unspecified: Secondary | ICD-10-CM | POA: Diagnosis not present

## 2022-10-18 DIAGNOSIS — Z79818 Long term (current) use of other agents affecting estrogen receptors and estrogen levels: Secondary | ICD-10-CM | POA: Diagnosis not present

## 2022-10-18 DIAGNOSIS — Z170 Estrogen receptor positive status [ER+]: Secondary | ICD-10-CM | POA: Diagnosis not present

## 2022-10-18 DIAGNOSIS — C50111 Malignant neoplasm of central portion of right female breast: Secondary | ICD-10-CM | POA: Diagnosis not present

## 2022-11-15 DIAGNOSIS — Z170 Estrogen receptor positive status [ER+]: Secondary | ICD-10-CM | POA: Diagnosis not present

## 2022-11-15 DIAGNOSIS — C50111 Malignant neoplasm of central portion of right female breast: Secondary | ICD-10-CM | POA: Diagnosis not present

## 2022-12-13 DIAGNOSIS — Z170 Estrogen receptor positive status [ER+]: Secondary | ICD-10-CM | POA: Diagnosis not present

## 2022-12-13 DIAGNOSIS — C50111 Malignant neoplasm of central portion of right female breast: Secondary | ICD-10-CM | POA: Diagnosis not present

## 2022-12-25 ENCOUNTER — Telehealth: Payer: Self-pay

## 2022-12-25 DIAGNOSIS — I2089 Other forms of angina pectoris: Secondary | ICD-10-CM | POA: Diagnosis not present

## 2022-12-25 DIAGNOSIS — R079 Chest pain, unspecified: Secondary | ICD-10-CM | POA: Diagnosis not present

## 2022-12-25 DIAGNOSIS — I10 Essential (primary) hypertension: Secondary | ICD-10-CM | POA: Diagnosis not present

## 2022-12-25 DIAGNOSIS — E860 Dehydration: Secondary | ICD-10-CM | POA: Diagnosis not present

## 2022-12-25 DIAGNOSIS — R0602 Shortness of breath: Secondary | ICD-10-CM | POA: Diagnosis not present

## 2022-12-25 DIAGNOSIS — R9230 Dense breasts, unspecified: Secondary | ICD-10-CM | POA: Insufficient documentation

## 2022-12-25 DIAGNOSIS — R600 Localized edema: Secondary | ICD-10-CM | POA: Diagnosis not present

## 2022-12-25 DIAGNOSIS — E782 Mixed hyperlipidemia: Secondary | ICD-10-CM | POA: Diagnosis not present

## 2022-12-25 DIAGNOSIS — I890 Lymphedema, not elsewhere classified: Secondary | ICD-10-CM | POA: Diagnosis not present

## 2022-12-25 DIAGNOSIS — F17200 Nicotine dependence, unspecified, uncomplicated: Secondary | ICD-10-CM | POA: Diagnosis not present

## 2022-12-25 NOTE — Telephone Encounter (Signed)
Medicaid Managed Care   Unsuccessful Outreach Note  12/25/2022 Name: MARIABELEN CHUGH MRN: [id number] DOB: 1971-02-12  Referred by: Armando Gang, FNP Reason for referral : No chief complaint on file.   An unsuccessful telephone outreach was attempted today. The patient was referred to the case management team for assistance with care management and care coordination.   Follow Up Plan: If patient returns call to provider office, please advise to call Embedded Care Management Care Guide Tommie Sams* at [phone]*  Nicholes Rough, CMA Care Guide [employer]

## 2022-12-27 ENCOUNTER — Telehealth: Payer: Self-pay | Admitting: *Deleted

## 2022-12-27 ENCOUNTER — Other Ambulatory Visit: Payer: Self-pay | Admitting: Hematology and Oncology

## 2022-12-27 NOTE — Telephone Encounter (Signed)
Received per EPIC request for exemestane refill - noted pt has transferred her care to Dr Drue Second at [facility] at [facility].  This RN contact requesting pharmacy to remove Dr Al Pimple for refills and gave above MD and contact information for refills.

## 2023-01-10 DIAGNOSIS — C50111 Malignant neoplasm of central portion of right female breast: Secondary | ICD-10-CM | POA: Diagnosis not present

## 2023-01-10 DIAGNOSIS — Z170 Estrogen receptor positive status [ER+]: Secondary | ICD-10-CM | POA: Diagnosis not present

## 2023-01-21 DIAGNOSIS — Z170 Estrogen receptor positive status [ER+]: Secondary | ICD-10-CM | POA: Diagnosis not present

## 2023-01-21 DIAGNOSIS — C50111 Malignant neoplasm of central portion of right female breast: Secondary | ICD-10-CM | POA: Diagnosis not present

## 2023-01-30 IMAGING — DX DG ABDOMEN 1V
2 series · 2 of 2 positions shown · non-contrast
Comparison: None.

CLINICAL DATA: Patient swallowed battery 3 days ago.

EXAM:
ABDOMEN - 1 VIEW

[abdomen kub (1 of 2)]
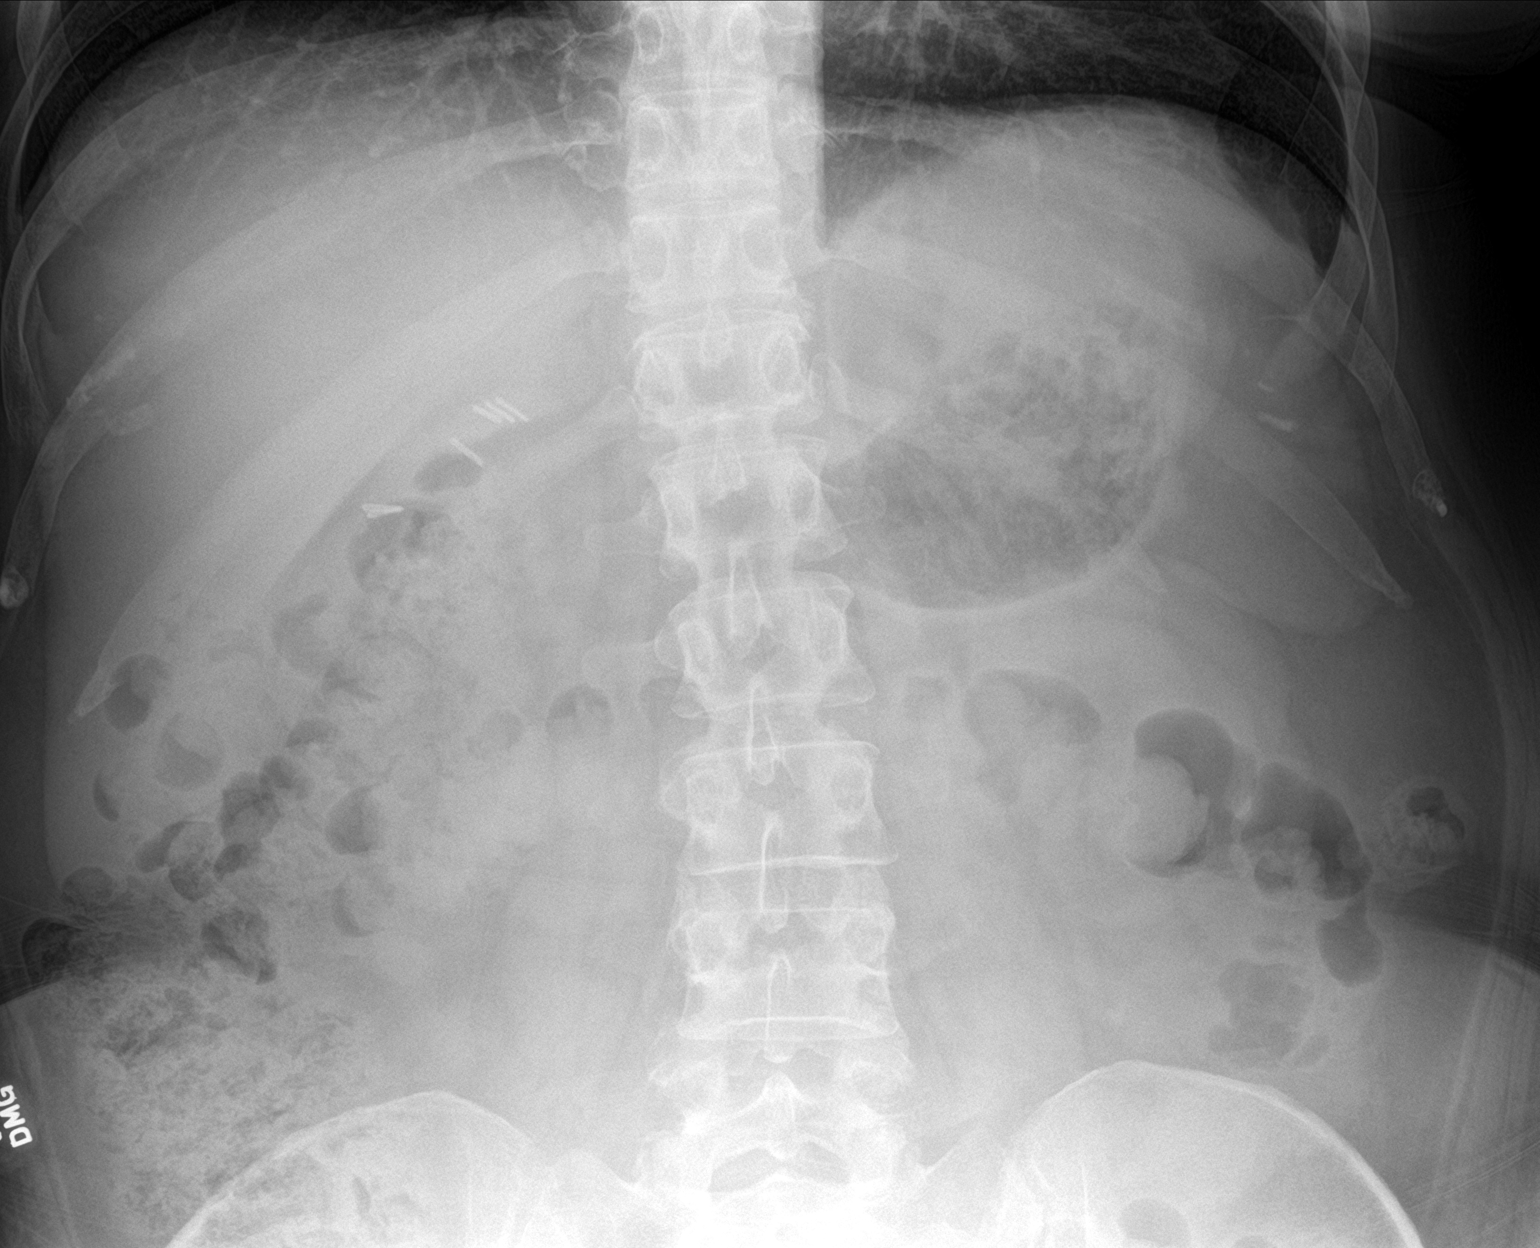

[abdomen kub (2 of 2)]
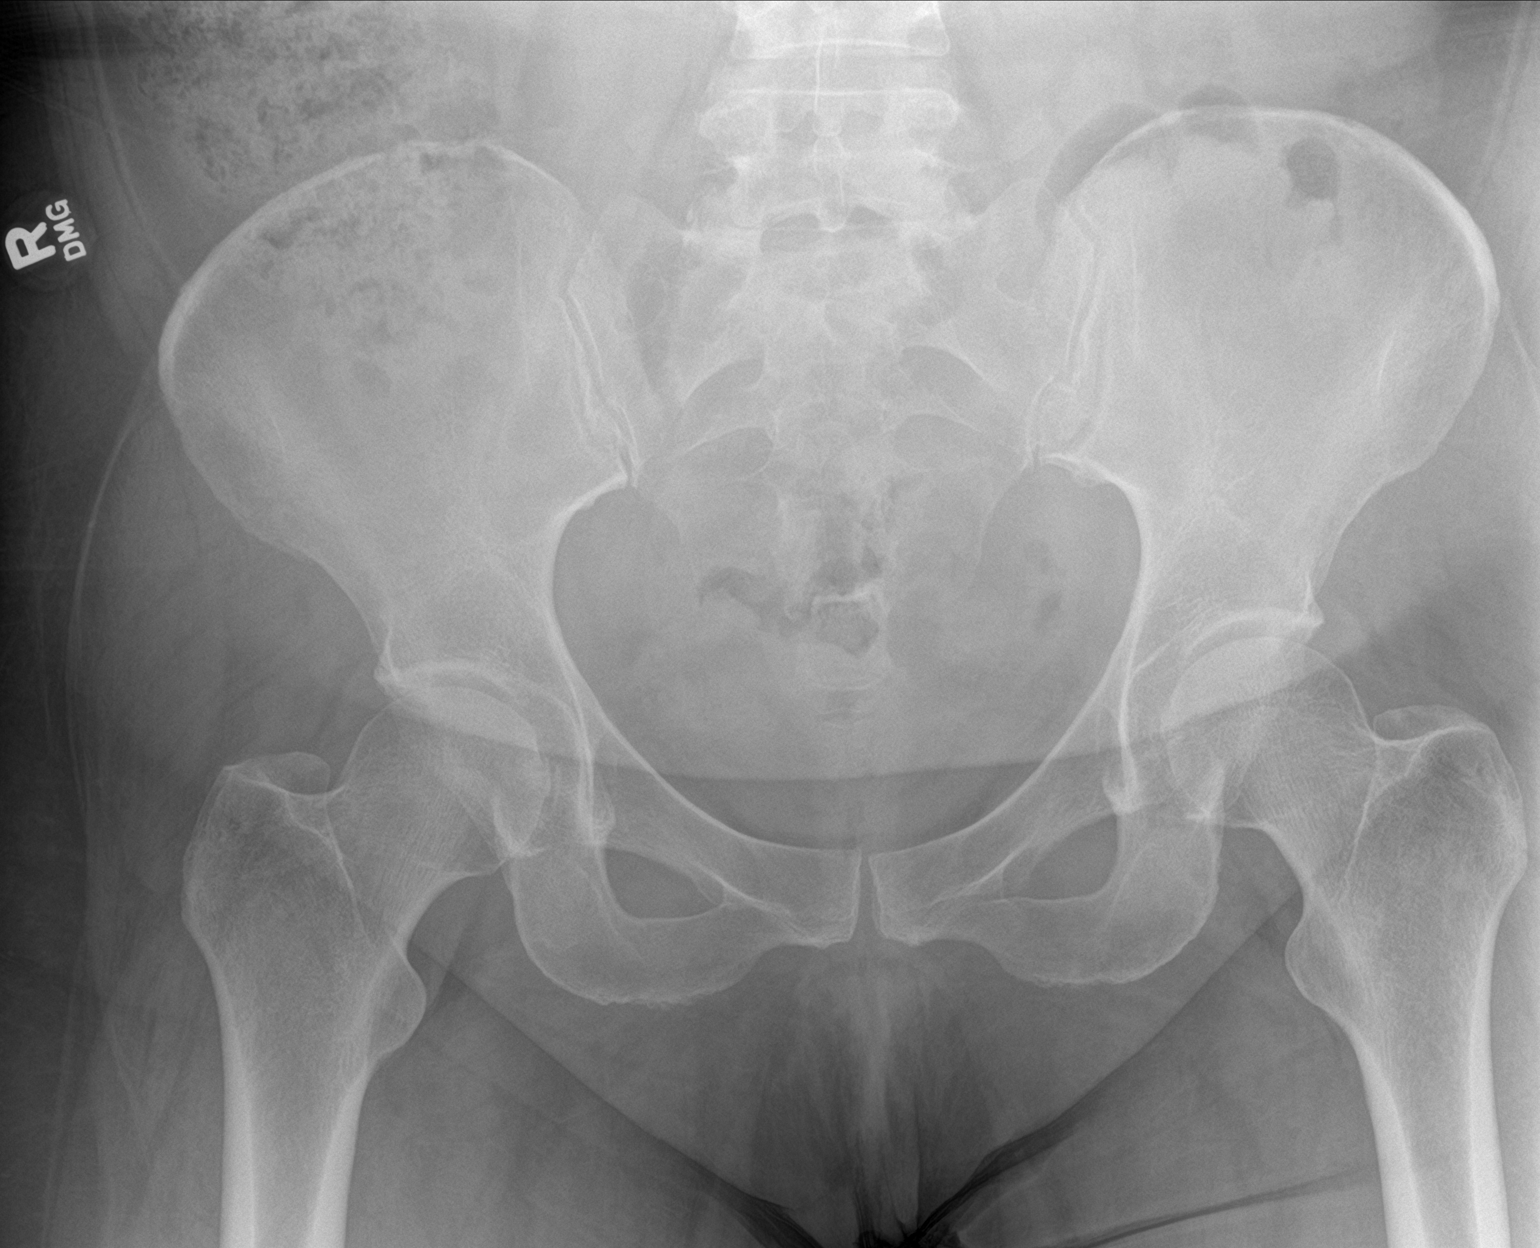

[2 of 2 positions shown; findings below may reference images not displayed]

FINDINGS: Mild fecal loading in the ascending colon. No bowel obstruction. No
foreign body identified. No other abnormalities.
IMPRESSION: Mild fecal loading in the ascending colon. No retained foreign
body/battery identified.

## 2023-02-07 DIAGNOSIS — C50111 Malignant neoplasm of central portion of right female breast: Secondary | ICD-10-CM | POA: Diagnosis not present

## 2023-02-07 DIAGNOSIS — Z170 Estrogen receptor positive status [ER+]: Secondary | ICD-10-CM | POA: Diagnosis not present

## 2023-02-27 DIAGNOSIS — F410 Panic disorder [episodic paroxysmal anxiety] without agoraphobia: Secondary | ICD-10-CM | POA: Diagnosis not present

## 2023-02-27 DIAGNOSIS — F900 Attention-deficit hyperactivity disorder, predominantly inattentive type: Secondary | ICD-10-CM | POA: Diagnosis not present

## 2023-02-27 DIAGNOSIS — F411 Generalized anxiety disorder: Secondary | ICD-10-CM | POA: Diagnosis not present

## 2023-03-07 DIAGNOSIS — C50111 Malignant neoplasm of central portion of right female breast: Secondary | ICD-10-CM | POA: Diagnosis not present

## 2023-03-07 DIAGNOSIS — Z170 Estrogen receptor positive status [ER+]: Secondary | ICD-10-CM | POA: Diagnosis not present

## 2023-04-07 DIAGNOSIS — C50111 Malignant neoplasm of central portion of right female breast: Secondary | ICD-10-CM | POA: Diagnosis not present

## 2023-04-07 DIAGNOSIS — Z170 Estrogen receptor positive status [ER+]: Secondary | ICD-10-CM | POA: Diagnosis not present

## 2023-05-05 DIAGNOSIS — C50111 Malignant neoplasm of central portion of right female breast: Secondary | ICD-10-CM | POA: Diagnosis not present

## 2023-05-05 DIAGNOSIS — Z170 Estrogen receptor positive status [ER+]: Secondary | ICD-10-CM | POA: Diagnosis not present

## 2023-05-21 DIAGNOSIS — K580 Irritable bowel syndrome with diarrhea: Secondary | ICD-10-CM | POA: Diagnosis not present

## 2023-05-21 DIAGNOSIS — M5441 Lumbago with sciatica, right side: Secondary | ICD-10-CM | POA: Diagnosis not present

## 2023-05-21 DIAGNOSIS — M25562 Pain in left knee: Secondary | ICD-10-CM | POA: Diagnosis not present

## 2023-05-21 DIAGNOSIS — G8929 Other chronic pain: Secondary | ICD-10-CM | POA: Diagnosis not present

## 2023-05-21 DIAGNOSIS — M5442 Lumbago with sciatica, left side: Secondary | ICD-10-CM | POA: Diagnosis not present

## 2023-05-21 DIAGNOSIS — Z9049 Acquired absence of other specified parts of digestive tract: Secondary | ICD-10-CM | POA: Diagnosis not present

## 2023-05-21 DIAGNOSIS — M25561 Pain in right knee: Secondary | ICD-10-CM | POA: Diagnosis not present

## 2023-05-21 DIAGNOSIS — Z791 Long term (current) use of non-steroidal anti-inflammatories (NSAID): Secondary | ICD-10-CM | POA: Diagnosis not present

## 2023-05-27 DIAGNOSIS — F900 Attention-deficit hyperactivity disorder, predominantly inattentive type: Secondary | ICD-10-CM | POA: Diagnosis not present

## 2023-05-27 DIAGNOSIS — F411 Generalized anxiety disorder: Secondary | ICD-10-CM | POA: Diagnosis not present

## 2023-05-27 DIAGNOSIS — F410 Panic disorder [episodic paroxysmal anxiety] without agoraphobia: Secondary | ICD-10-CM | POA: Diagnosis not present

## 2023-06-09 DIAGNOSIS — Z170 Estrogen receptor positive status [ER+]: Secondary | ICD-10-CM | POA: Diagnosis not present

## 2023-06-09 DIAGNOSIS — C50111 Malignant neoplasm of central portion of right female breast: Secondary | ICD-10-CM | POA: Diagnosis not present

## 2023-06-28 ENCOUNTER — Other Ambulatory Visit: Payer: Self-pay

## 2023-06-28 ENCOUNTER — Emergency Department (HOSPITAL_COMMUNITY)

## 2023-06-28 ENCOUNTER — Emergency Department (HOSPITAL_COMMUNITY)
Admission: EM | Admit: 2023-06-28 | Discharge: 2023-06-29 | Disposition: A | Discharge status: Home / Self Care | Attending: Emergency Medicine | Admitting: Emergency Medicine

## 2023-06-28 ENCOUNTER — Encounter (HOSPITAL_COMMUNITY): Payer: Self-pay | Admitting: *Deleted

## 2023-06-28 DIAGNOSIS — M47816 Spondylosis without myelopathy or radiculopathy, lumbar region: Secondary | ICD-10-CM | POA: Diagnosis not present

## 2023-06-28 DIAGNOSIS — R918 Other nonspecific abnormal finding of lung field: Secondary | ICD-10-CM | POA: Diagnosis not present

## 2023-06-28 DIAGNOSIS — M4316 Spondylolisthesis, lumbar region: Secondary | ICD-10-CM | POA: Diagnosis not present

## 2023-06-28 DIAGNOSIS — I700 Atherosclerosis of aorta: Secondary | ICD-10-CM | POA: Diagnosis not present

## 2023-06-28 DIAGNOSIS — R079 Chest pain, unspecified: Secondary | ICD-10-CM | POA: Diagnosis not present

## 2023-06-28 DIAGNOSIS — E876 Hypokalemia: Secondary | ICD-10-CM | POA: Diagnosis not present

## 2023-06-28 DIAGNOSIS — M48061 Spinal stenosis, lumbar region without neurogenic claudication: Secondary | ICD-10-CM | POA: Diagnosis not present

## 2023-06-28 DIAGNOSIS — M5442 Lumbago with sciatica, left side: Secondary | ICD-10-CM | POA: Insufficient documentation

## 2023-06-28 DIAGNOSIS — I10 Essential (primary) hypertension: Secondary | ICD-10-CM | POA: Diagnosis not present

## 2023-06-28 DIAGNOSIS — M5127 Other intervertebral disc displacement, lumbosacral region: Secondary | ICD-10-CM | POA: Diagnosis not present

## 2023-06-28 LAB — BASIC METABOLIC PANEL WITH GFR
Anion gap: 11 (ref 5–15)
BUN: 12 mg/dL (ref 6–20)
CO2: 26 mmol/L (ref 22–32)
Calcium: 9.5 mg/dL (ref 8.9–10.3)
Chloride: 103 mmol/L (ref 98–111)
Creatinine, Ser: 1.11 mg/dL — ABNORMAL HIGH (ref 0.44–1.00)
GFR, Estimated: >60 mL/min (ref >60)
Glucose, Bld: 94 mg/dL (ref 70–99)
Potassium: 3.1 mmol/L — ABNORMAL LOW (ref 3.5–5.1)
Sodium: 140 mmol/L (ref 135–145)

## 2023-06-28 LAB — TROPONIN I (HIGH SENSITIVITY): Troponin I (High Sensitivity): 6 ng/L (ref <18)

## 2023-06-28 LAB — CBC
HCT: 41.1 % (ref 36.0–46.0)
Hemoglobin: 13.6 g/dL (ref 12.0–15.0)
MCH: 30.2 pg (ref 26.0–34.0)
MCHC: 33.1 g/dL (ref 30.0–36.0)
MCV: 91.3 fL (ref 80.0–100.0)
Platelets: 240 10*3/uL (ref 150–400)
RBC: 4.5 MIL/uL (ref 3.87–5.11)
RDW: 13.3 % (ref 11.5–15.5)
WBC: 9.2 10*3/uL (ref 4.0–10.5)
nRBC: 0 % (ref 0.0–0.2)

## 2023-06-28 MED ORDER — OXYCODONE-ACETAMINOPHEN 5-325 MG PO TABS: 1.0000 | ORAL_TABLET | Freq: Four times a day (QID) | ORAL | 0 refills | Status: DC | PRN

## 2023-06-28 MED ORDER — METHOCARBAMOL 500 MG PO TABS
500.0000 mg | ORAL_TABLET | Freq: Once | ORAL | Status: AC
  Administered 2023-06-28: 500 mg via ORAL
  Filled 2023-06-28: qty 1

## 2023-06-28 MED ORDER — LIDOCAINE 5 % EX PTCH: 1.0000 | MEDICATED_PATCH | CUTANEOUS | 0 refills | Status: AC

## 2023-06-28 MED ORDER — HYDROMORPHONE HCL 1 MG/ML IJ SOLN
0.5000 mg | Freq: Once | INTRAMUSCULAR | Status: AC
  Administered 2023-06-28: 0.5 mg via INTRAVENOUS
  Filled 2023-06-28: qty 1

## 2023-06-28 MED ORDER — LIDOCAINE 5 % EX PTCH
1.0000 | MEDICATED_PATCH | CUTANEOUS | Status: DC
  Administered 2023-06-28: 1 via TRANSDERMAL
  Filled 2023-06-28: qty 1

## 2023-06-28 MED ORDER — CELECOXIB 100 MG PO CAPS: 100.0000 mg | ORAL_CAPSULE | Freq: Two times a day (BID) | ORAL | 0 refills | Status: AC

## 2023-06-28 MED ORDER — GADOBUTROL 1 MMOL/ML IV SOLN
8.0000 mL | Freq: Once | INTRAVENOUS | Status: AC | PRN
  Administered 2023-06-28: 8 mL via INTRAVENOUS

## 2023-06-28 MED ORDER — LORAZEPAM 2 MG/ML IJ SOLN
1.0000 mg | Freq: Once | INTRAMUSCULAR | Status: AC
  Administered 2023-06-28: 1 mg via INTRAVENOUS
  Filled 2023-06-28: qty 1

## 2023-06-28 MED ORDER — OXYCODONE-ACETAMINOPHEN 5-325 MG PO TABS
1.0000 | ORAL_TABLET | Freq: Once | ORAL | Status: AC
  Administered 2023-06-28: 1 via ORAL
  Filled 2023-06-28: qty 1

## 2023-06-28 MED ORDER — KETOROLAC TROMETHAMINE 15 MG/ML IJ SOLN
15.0000 mg | Freq: Once | INTRAMUSCULAR | Status: AC
  Administered 2023-06-28: 15 mg via INTRAVENOUS
  Filled 2023-06-28: qty 1

## 2023-06-28 MED ORDER — MORPHINE SULFATE (PF) 4 MG/ML IV SOLN: 4.0000 mg | Freq: Once | INTRAVENOUS | Status: DC

## 2023-06-28 NOTE — ED Triage Notes (Signed)
 The pt has back pain for 19 months and for 2 years she had injections in her back

## 2023-06-28 NOTE — ED Notes (Signed)
 MD notified of pt reported claustrophobia with MRIs.

## 2023-06-28 NOTE — Discharge Instructions (Addendum)
 It was a pleasure taking care of you in the ED today.  Your workup today was reassuring however your MRI did show degenerative changes and disc bulge. I have sent you in some pain medication and anti-inflammatory medication since you cannot take steroids.  Take as prescribed.  Do not take any additional anti-inflammatory medication such as ibuprofen  or Aleve while taking the Celebrex .  Do not take Mobic  while taking this medication. The pain medication I have sent in, Percocet  does have a controlled substance in this.  Do not drive or operate heavy machine-year-old taking this medication.  I have placed an order to a neurosurgeon in your discharge paperwork.  Call on Monday and let them know you were seen here in the emergency department  Make sure to follow-up outpatient, return for any worsening symptoms.

## 2023-06-28 NOTE — ED Provider Triage Note (Signed)
 Emergency Medicine Provider Triage Evaluation Note  Melissa Shepherd , a 53 y.o. female  was evaluated in triage.  Pt complains of low back pain.  Reports 19-year history of low back pain.  States that yesterday it acutely worsened without cause.  Denies trauma or event to account for increased pain.  States that in the last 1 week she has had increased urinary incontinence, groin numbness and distal weakness.  States she is now having to use maxipads because of her urinary incontinence.  When asked further about this, she reports that she will just be sitting there and spontaneously urinate on herself.  Denies bowel incontinence.  Denies fevers nausea or vomiting.  Reports that she is seen numerous specialist for her back pain.  Review of Systems  Positive:  Negative:   Physical Exam  BP 95/62 (BP Location: Left Arm)   Pulse 65   Temp 98.2 F (36.8 C)   Resp 15   Ht 5\' 5"  (1.651 m)   Wt 78.7 kg   LMP 05/08/2017 (Approximate) Comment: neg preg test  SpO2 98%   BMI 28.87 kg/m  Gen:   Awake, no distress   Resp:  Normal effort  MSK:   Moves extremities without difficulty  Other:    Medical Decision Making  Medically screening exam initiated at 6:17 PM.  Appropriate orders placed.  Melissa Shepherd was informed that the remainder of the evaluation will be completed by another provider, this initial triage assessment does not replace that evaluation, and the importance of remaining in the ED until their evaluation is complete.     Adel Aden, PA-C 06/28/23 1818

## 2023-06-28 NOTE — ED Notes (Signed)
 Patient transported to MRI

## 2023-06-28 NOTE — ED Provider Notes (Cosign Needed)
Melissa Shepherd Provider Note   CSN: [id number] Arrival date & time: 06/28/23  1745    History  Chief Complaint  Patient presents with   Back Pain    Melissa Shepherd is a 53 y.o. female multiple medical problems here for back pain. Hx of similar. Known disc problem, prior steroid injections at [facility] 2023 wo relief. Has seen "multiple neurosurgeons and they did not do anything."  She has chronic right sided sciatica. "I live in daily pain." When to reach up into truck, felt pulling and immediate pain to left lower back into left gluteal region. Movement makes it worse. Takes tylenol  only for the pain. Feels like she has spasms to left lower back. Chronic pain at BIL knees without erythema, warmth. No recent falls.  For last 2-3 weeks has noted waking up in the morning with urinary incontinence. 1 episode yesterday during the day however she related it due to her pain and not being able to make it to the bathroom in time. She states she is occasionally has numbness in her groin and a "cell phone vibration" in this area ongoing for "many years".   Chronic fecal incontinence "having to wear maxipads" due to urge incontinence followed by GI, takes colestipol .  Has occasional chest pain which she states she has been seen by cardiology for and was told was anxiety.  Episode here in the emergency department just prior to my evaluation.  No shortness of breath.  No palpitations. non exertional in nature. No swelling to LE. No hx of PE, DVT. NO hx of AAA, dissection. CT calcium  score 0 12/25/2022.      HPI     Home Medications Prior to Admission medications   Medication Sig Start Date End Date Taking? Authorizing Provider  celecoxib  (CELEBREX ) 100 MG capsule Take 1 capsule (100 mg total) by mouth 2 (two) times daily. 06/28/23 07/28/23 Yes Amrit Erck A, PA-C  lidocaine  (LIDODERM ) 5 % Place 1 patch onto the skin daily. Remove & Discard patch within 12 hours  or as directed by MD 06/28/23  Yes Ashlin Kreps A, PA-C  oxyCODONE -acetaminophen  (PERCOCET/ROXICET) 5-325 MG tablet Take 1 tablet by mouth every 6 (six) hours as needed for severe pain (pain score 7-10). 06/28/23  Yes Daniele Yankowski A, PA-C  ALPRAZolam  (XANAX ) 1 MG tablet Take 1 mg by mouth 3 (three) times daily.    [provider]  amphetamine-dextroamphetamine (ADDERALL) 20 MG tablet Take 20 mg by mouth 2 (two) times daily. 12/23/17   [provider]  aspirin  81 MG EC tablet Take 1 tablet (81 mg total) by mouth daily. Swallow whole. 06/22/21   Ozell Blunt, MD  colestipol  (COLESTID ) 1 g tablet Take 1-2 g by mouth 2 (two) times daily. 02/01/21   [provider]  exemestane  (AROMASIN ) 25 MG tablet TAKE 1 TABLET (25 MG TOTAL) BY MOUTH DAILY. 06/14/22   Iruku, Praveena, MD  folic acid  (FOLVITE ) 1 MG tablet Take 1 mg by mouth at bedtime. 12/29/20   [provider]  furosemide  (LASIX ) 20 MG tablet Take 20 mg by mouth daily. 09/10/21   [provider]  gabapentin  (NEURONTIN ) 300 MG capsule Take 300 mg by mouth 3 (three) times daily. 02/13/21   [provider]  hydrochlorothiazide  (HYDRODIURIL ) 12.5 MG tablet TAKE 1 TABLET BY MOUTH DAILY. Patient taking differently: Take 12.5 mg by mouth daily. 11/18/16   Kent Pear, MD  metoprolol  succinate (TOPROL -XL) 100 MG 24 hr  tablet Take 100 mg by mouth daily. 03/12/21   [provider]  mupirocin  ointment (BACTROBAN ) 2 % Apply 1 application. topically 3 (three) times daily. As directed for 5 days 07/13/21   Liane Redman, MD  nystatin  (MYCOSTATIN ) 100000 UNIT/ML suspension Take 5 mLs (500,000 Units total) by mouth 4 (four) times daily. 11/04/21   Dodson Freestone, FNP  potassium chloride  SA (KLOR-CON  M) 20 MEQ tablet Take 20 mEq by mouth at bedtime. 03/09/21   [provider]  rosuvastatin  (CRESTOR ) 40 MG tablet Take 40 mg by mouth at bedtime.    [provider]  traMADol (ULTRAM) 50  MG tablet Take 50-100 mg by mouth every 8 (eight) hours as needed. 10/05/21   [provider]      Allergies    Ezetimibe and Prednisone    Review of Systems   Review of Systems  Constitutional: Negative.   HENT: Negative.    Respiratory: Negative.    Cardiovascular:  Positive for chest pain (chronic, intermittent).  Gastrointestinal:  Positive for diarrhea (chronic). Negative for abdominal distention, abdominal pain, anal bleeding, blood in stool, constipation, nausea, rectal pain and vomiting.  Genitourinary:  Positive for urgency (chronic).  Musculoskeletal:  Positive for back pain and gait problem (pain with movement making it difficult to walk). Negative for arthralgias, joint swelling, myalgias, neck pain and neck stiffness.  Skin: Negative.   Neurological:  Positive for numbness (chronic, intermittent). Negative for dizziness, tremors, syncope, facial asymmetry, speech difficulty, weakness, light-headedness and headaches.  All other systems reviewed and are negative.   Physical Exam Updated Vital Signs BP 95/62 (BP Location: Left Arm)   Pulse 65   Temp 98.2 F (36.8 C)   Resp 15   Ht 5\' 5"  (1.651 m)   Wt 78.7 kg   LMP 05/08/2017 (Approximate) Comment: neg preg test  SpO2 98%   BMI 28.87 kg/m  Physical Exam Vitals and nursing note reviewed.  Constitutional:      General: She is not in acute distress.    Appearance: She is well-developed. She is not ill-appearing, toxic-appearing or diaphoretic.  HENT:     Head: Normocephalic and atraumatic.     Nose: Nose normal.  Eyes:     Pupils: Pupils are equal, round, and reactive to light.  Cardiovascular:     Rate and Rhythm: Normal rate.     Pulses: Normal pulses.     Heart sounds: Normal heart sounds.  Pulmonary:     Effort: Pulmonary effort is normal. No respiratory distress.     Breath sounds: Normal breath sounds.  Chest:     Comments: Right breast mastectomy Abdominal:     General: Bowel sounds are  normal. There is no distension.     Palpations: Abdomen is soft.     Tenderness: There is no abdominal tenderness. There is no right CVA tenderness, left CVA tenderness, guarding or rebound.  Musculoskeletal:        General: Tenderness present. Normal range of motion.     Cervical back: Normal range of motion.     Comments: Tenderness midline region diffusely across lower back left greater than right.  Difficult to assess for SLR due to any pain with movement of legs off bed.  Skin:    General: Skin is warm and dry.     Capillary Refill: Capillary refill takes less than 2 seconds.     Comments: No edema, erythema or warmth  Neurological:     General: No focal deficit present.  Mental Status: She is alert.     Cranial Nerves: Cranial nerves 2-12 are intact.     Comments: Patient unwilling to ambulate due to pain Intact sensation bilaterally Initially declined rectal exam for tone due to hallway bed  Psychiatric:        Mood and Affect: Mood normal.    ED Results / Procedures / Treatments   Labs (all labs ordered are listed, but only abnormal results are displayed) Labs Reviewed  BASIC METABOLIC PANEL WITH GFR - Abnormal; Notable for the following components:      Result Value   Potassium 3.1 (*)    Creatinine, Ser 1.11 (*)    All other components within normal limits  CBC  TROPONIN I (HIGH SENSITIVITY)  TROPONIN I (HIGH SENSITIVITY)    EKG EKG Interpretation Date/Time:  Saturday Jun 28 2023 20:55:53 EDT Ventricular Rate:  63 PR Interval:  134 QRS Duration:  84 QT Interval:  420 QTC Calculation: 429 R Axis:   79  Text Interpretation: Sinus rhythm with occasional Premature ventricular complexes Otherwise normal ECG When compared with ECG of 08-Aug-2020 00:01, PREVIOUS ECG IS PRESENT Confirmed by Almond Army ([id number]) on 06/28/2023 9:07:18 PM  Radiology DG Chest Portable 1 View Result Date: 06/28/2023 CLINICAL DATA:  Chest pain EXAM: PORTABLE CHEST 1 VIEW COMPARISON:   08/07/2020 FINDINGS: Low lung volumes accentuate pulmonary vascularity. Bibasilar atelectasis or infiltrates. No pleural effusion or pneumothorax. Stable cardiomediastinal silhouette. Aortic atherosclerotic calcification. IMPRESSION: Low lung volumes with bibasilar atelectasis or infiltrates. Electronically Signed   By: Rozell Cornet M.D.   On: 06/28/2023 23:00   MR Lumbar Spine W Wo Contrast Result Date: 06/28/2023 CLINICAL DATA:  Low back pain, cauda equina syndrome suspected. EXAM: MRI LUMBAR SPINE WITHOUT AND WITH CONTRAST TECHNIQUE: Multiplanar and multiecho pulse sequences of the lumbar spine were obtained without and with intravenous contrast. CONTRAST:  8mL GADAVIST  GADOBUTROL  1 MMOL/ML IV SOLN COMPARISON:  None Available. FINDINGS: Segmentation:  No substantial sagittal subluxation. Alignment:  Grade 1 anterolisthesis of L3 on L4. Vertebrae: Edema involving the posterior elements at L3-L4, likely stress/degenerative related. No specific evidence of acute fracture or discitis/osteomyelitis. No suspicious bone lesion. Conus medullaris and cauda equina: Conus extends to the L1-L2 level. Conus appears normal. Paraspinal and other soft tissues: At L3-L4, perifacet edema and enhancement. Otherwise, unremarkable. Disc levels: T12-L1: No significant disc protrusion, foraminal stenosis, or canal stenosis. L1-L2: No significant disc protrusion, foraminal stenosis, or canal stenosis. L2-L3: No significant disc protrusion, foraminal stenosis, or canal stenosis. L3-L4: Grade 1 anterolisthesis. Mild disc bulge. Severe bilateral facet arthropathy. Patent canal and foramina. L4-L5: Mild disc bulging and facet arthropathy with mild left foraminal stenosis. Patent canal and right foramina. L5-S1: Central disc protrusion. No significant canal or foraminal stenosis. IMPRESSION: 1. Severe L3-L4 facet arthropathy including marrow edema, perifacet edema, and degenerative grade 1 anterolisthesis. 2. At L4-L5, mild left  foraminal stenosis. Electronically Signed   By: Stevenson Elbe M.D.   On: 06/28/2023 22:23    Procedures Procedures    Medications Ordered in ED Medications  lidocaine  (LIDODERM ) 5 % 1 patch (1 patch Transdermal Patch Applied 06/28/23 2303)  oxyCODONE -acetaminophen  (PERCOCET/ROXICET) 5-325 MG per tablet 1 tablet (1 tablet Oral Given 06/28/23 1823)  LORazepam (ATIVAN) injection 1 mg (1 mg Intravenous Given 06/28/23 2112)  HYDROmorphone  (DILAUDID ) injection 0.5 mg (0.5 mg Intravenous Given 06/28/23 2105)  gadobutrol  (GADAVIST ) 1 MMOL/ML injection 8 mL (8 mLs Intravenous Contrast Given 06/28/23 2154)  methocarbamol  (ROBAXIN ) tablet 500 mg (500 mg Oral  Given 06/28/23 2303)  ketorolac  (TORADOL ) 15 MG/ML injection 15 mg (15 mg Intravenous Given 06/28/23 2303)    ED Course/ Medical Decision Making/ A&P Clinical Course as of 06/28/23 2358  Sat Jun 28, 2023  2213 MR Lumbar Spine W Wo Contrast [HB]    Clinical Course User Index [HB] Bretta Camp, [place]   53 year old here for evaluation of left lower back pain.  19-year history of similar.  When to reach up into her track and felt acute worsening to left lower back pain.  She has known chronic sciatica to right leg which she states is now rating down her left leg.  She has a complex medical history with multiple medical comorbidities, history difficult to obtain acute versus chronic.  She has some chronic recurrent fecal incontinence due to IBS-D takes colestipol .  Sounds like she has some chronic intermittent groin numbness as well as "cell phone vibrating" to her GU region.  She also states that she has intermittent urinary incontinence mostly at night.  Developed an episode of chest pain here however states this feels like her chronic pain that she gets.  Low suspicion for dissection.  Will plan on labs and MRI of her lumbar spine as well as pain control as ordered from triage  Labs and imaging personally viewed and interpreted:  CBC without  leukocytosis Metabolic panel potassium 3.1, creatinine 1.11 Troponin 6 EKG without ischemic changes occasional PVC  MR lumbar facet arthropathy, mild foraminal stenosis  Discussed with attending Dr. Leida Puna who reviewed imaging.  Recommend symptomatic management and follow-up outpatient with neurosurgery  Discussed with patient.  Ambulatory to the bathroom.  States she cannot do steroids.  Will start her on anti-inflammatories.  No history of GI bleeds, recent brain bleed.  Low suspicion for cauda equina, discitis, osteomyelitis, transverse myelitis, psoas abscess, AAA, dissection, VTE, ischemia, fracture, dislocation  Chest xray with atelectasis.  No current pain.  Hemodynamically stable.  Low suspicion for acute ACS, PE, dissection.  Plan to DC home after second troponin. Family made patient agreeable with this.                                Medical Decision Making Amount and/or Complexity of Data Reviewed Independent Historian: spouse External Data Reviewed: labs, radiology, ECG and notes. Labs: ordered. Decision-making details documented in ED Course. Radiology: ordered and independent interpretation performed. Decision-making details documented in ED Course. ECG/medicine tests: ordered and independent interpretation performed. Decision-making details documented in ED Course.  Risk OTC drugs. Prescription drug management. Parenteral controlled substances. Decision regarding hospitalization. Diagnosis or treatment significantly limited by social determinants of health.      Final Clinical Impression(s) / ED Diagnoses Final diagnoses:  Acute left-sided low back pain with left-sided sciatica    Rx / DC Orders ED Discharge Orders          Ordered    lidocaine  (LIDODERM ) 5 %  Every 24 hours        06/28/23 2310    oxyCODONE -acetaminophen  (PERCOCET/ROXICET) 5-325 MG tablet  Every 6 hours PRN        06/28/23 2310    celecoxib  (CELEBREX ) 100 MG capsule  2 times daily         06/28/23 2310              Tavarus Poteete A, PA-C 06/28/23 2358    Sally Reimers A, PA-C 06/29/23 1505    Almond Army, MD 06/29/23  1622  

## 2023-06-28 NOTE — ED Triage Notes (Signed)
 She has urinary incontinence for 2 weeks rt hip worse for several days

## 2023-06-29 LAB — TROPONIN I (HIGH SENSITIVITY): Troponin I (High Sensitivity): 7 ng/L (ref <18)

## 2023-07-02 DIAGNOSIS — I10 Essential (primary) hypertension: Secondary | ICD-10-CM | POA: Diagnosis not present

## 2023-07-02 DIAGNOSIS — R0602 Shortness of breath: Secondary | ICD-10-CM | POA: Diagnosis not present

## 2023-07-02 DIAGNOSIS — I890 Lymphedema, not elsewhere classified: Secondary | ICD-10-CM | POA: Diagnosis not present

## 2023-07-02 DIAGNOSIS — F17200 Nicotine dependence, unspecified, uncomplicated: Secondary | ICD-10-CM | POA: Diagnosis not present

## 2023-07-02 DIAGNOSIS — I2089 Other forms of angina pectoris: Secondary | ICD-10-CM | POA: Diagnosis not present

## 2023-07-02 DIAGNOSIS — E782 Mixed hyperlipidemia: Secondary | ICD-10-CM | POA: Diagnosis not present

## 2023-07-02 DIAGNOSIS — R079 Chest pain, unspecified: Secondary | ICD-10-CM | POA: Diagnosis not present

## 2023-07-02 DIAGNOSIS — R600 Localized edema: Secondary | ICD-10-CM | POA: Diagnosis not present

## 2023-07-09 DIAGNOSIS — C50111 Malignant neoplasm of central portion of right female breast: Secondary | ICD-10-CM | POA: Diagnosis not present

## 2023-07-09 DIAGNOSIS — Z170 Estrogen receptor positive status [ER+]: Secondary | ICD-10-CM | POA: Diagnosis not present

## 2023-07-16 DIAGNOSIS — M4310 Spondylolisthesis, site unspecified: Secondary | ICD-10-CM | POA: Diagnosis not present

## 2023-08-05 DIAGNOSIS — G8929 Other chronic pain: Secondary | ICD-10-CM | POA: Diagnosis not present

## 2023-08-05 DIAGNOSIS — M545 Low back pain, unspecified: Secondary | ICD-10-CM | POA: Diagnosis not present

## 2023-08-05 DIAGNOSIS — M4310 Spondylolisthesis, site unspecified: Secondary | ICD-10-CM | POA: Insufficient documentation

## 2023-08-05 DIAGNOSIS — M25562 Pain in left knee: Secondary | ICD-10-CM | POA: Diagnosis not present

## 2023-08-05 DIAGNOSIS — M25561 Pain in right knee: Secondary | ICD-10-CM | POA: Diagnosis not present

## 2023-08-05 NOTE — Progress Notes (Signed)
 History of Present Illness: The patient is an 53 y.o. female seen in clinic today for evaluation of her bilateral knees.  Patient reports she has had pain for several years in both knees sometimes worse on the left sometimes worse on the right.  She describes the pain as intermittent in nature which has become more constant over time as a dull aching pain with sharp stabs of pain sometimes over the anterior medial aspect of the knees.  She can take Tylenol  as needed for the pain she has had previous physical therapy and exercise programs and has been treated on meloxicam  in the past but has not had any recent treatments or any injections in the past.  Reports her pain is up to a 10 out of 10 on a daily basis limiting her function ability to play with her grandkids.  The patient denies fevers, chills, numbness, tingling, shortness of breath, chest pain, recent illness, or any trauma.  Past Medical History: Past Medical History:  Diagnosis Date  . Anemia, unspecified   . Anxiety disorder, unspecified   . Bipolar disorder, unspecified (CMS/HHS-HCC)   . BMI 36.0-36.9,adult   . Chicken pox   . COPD (chronic obstructive pulmonary disease) (CMS/HHS-HCC)   . Depression   . Edema   . Endometriosis, unspecified   . Frequent headaches   . Hyperlipidemia   . Hypertension   . Major depressive disorder with single episode   . Metabolic syndrome   . Migraines   . Morbid obesity due to excess calories (CMS/HHS-HCC)   . Nonalcoholic steatohepatitis (NASH)   . Nontoxic multinodular goiter   . Other specified diseases of liver     Past Surgical History: Past Surgical History:  Procedure Laterality Date  . COLONOSCOPY  11/10/2017   Negative colon biopsy/Repeat 71yrs at age 79/TKT  . EGD  11/10/2017   Gastritis/No Repeat/TKT  . CESAREAN DELIVERY  1991; 2003   2     Past Family History: Family History  Problem Relation Age of Onset  . High blood pressure (Hypertension) Mother   . Colon polyps  Father   . Cervical cancer Sister        passed at age 56  . Myocardial Infarction (Heart attack) Maternal Aunt   . Diabetes Maternal Aunt   . Breast cancer Paternal Aunt   . Breast cancer Paternal Uncle   . Myocardial Infarction (Heart attack) Maternal Grandfather        passed away at 42  . Lung cancer Paternal Grandmother     Medications: Current Outpatient Medications  Medication Sig Dispense Refill  . ALPRAZolam  (XANAX ) 1 MG tablet Take 1 tablet by mouth 3 (three) times daily as needed  2  . ascorbic acid, vitamin C, (VITAMIN C) 500 MG tablet Take 500 mg by mouth once daily    . colestipoL  (COLESTID ) 1 gram tablet Take 2 tablets (2 g total) by mouth 2 (two) times daily 360 tablet 1  . dextroamphetamine-amphetamine (ADDERALL) 20 mg tablet Take 20 mg by mouth 2 (two) times daily    . exemestane  (AROMASIN ) 25 mg tablet Take 25 mg by mouth once daily    . folic acid  (FOLVITE ) 1 MG tablet Take 1 tablet by mouth once daily    . FUROsemide  (LASIX ) 40 MG tablet TAKE 1 TABLET (40 MG TOTAL) BY MOUTH ONCE DAILY 90 tablet 2  . hydroCHLOROthiazide  (HYDRODIURIL ) 12.5 MG tablet Take 1 tablet (12.5 mg total) by mouth once daily 60 tablet 1  . lidocaine  (LIDODERM )  5 % patch Place 1 patch onto the skin once daily    . losartan (COZAAR) 50 MG tablet TAKE 1 TABLET (50 MG TOTAL) BY MOUTH ONCE DAILY 90 tablet 3  . metoprolol  succinate (TOPROL -XL) 100 MG XL tablet Take 1 tablet (100 mg total) by mouth once daily 60 tablet 1  . mupirocin  (BACTROBAN ) 2 % cream Apply 1 Application topically 3 (three) times daily    . nitroGLYcerin (NITROSTAT) 0.4 MG SL tablet Place 1 tablet (0.4 mg total) under the tongue every 5 (five) minutes as needed for Chest pain May take up to 3 doses. 25 tablet 1  . oxyCODONE -acetaminophen  (PERCOCET) 5-325 mg tablet Take 1 tablet by mouth every 6 (six) hours as needed for Pain    . potassium chloride  (KLOR-CON  M20) 20 MEQ ER tablet Take 20 mEq by mouth once daily    . pregabalin  (LYRICA) 75 MG capsule Take 75 mg by mouth 2 (two) times daily    . rosuvastatin  (CRESTOR ) 40 MG tablet Take 40 mg by mouth once daily     No current facility-administered medications for this visit.    Allergies: Allergies  Allergen Reactions  . Ezetimibe Other (See Comments)  . Prednisone Other (See Comments)     Visit Vitals: There were no vitals filed for this visit.   Review of Systems:  A comprehensive 14 point ROS was performed, reviewed, and the pertinent orthopaedic findings are documented in the HPI.  Physical Exam: General/Constitutional: No apparent distress: well-nourished and well developed. Eyes: Pupils equal, round with synchronous movement. Respiratory: Non-labored breathing. Cardiac:  Heart rate is regular. Integumentary: No impressive skin lesions present, except as noted in detailed exam. Neuro/Psych: Normal mood and affect, oriented to person, place and time.  Comprehensive Knee Exam: Gait Antalgic  Alignment Neutral   Inspection  Right Left  Skin Normal appearance with no obvious deformity.  No ecchymosis or erythema. Normal appearance with no obvious deformity.  No ecchymosis or erythema.  Soft Tissue No focal soft tissue swelling No focal soft tissue swelling  Quad Atrophy None None   Palpation   Right Left  Tenderness Medial joint line parapatellar tenderness to palpation Medial joint line parapatellar tenderness to palpation  Crepitus + patellofemoral crepitus + patellofemoral and tibiofemoral crepitus  Effusion None Moderate   Range of Motion  Right Left  Flexion  0-120 0-120  Extension  Full knee extension without hyperextension Full knee extension without hyperextension   Ligamentous Exam   Right Left  Lachman Normal Normal  Valgus 0 Normal Normal  Valgus 30 Normal Normal  Varus 0 Normal Normal  Varus 30 Normal Normal  Anterior Drawer Normal Normal  Posterior Drawer Normal Normal    Meniscal Exam   Right Left  Hyperflexion Test  Positive Positive  Hyperextension Test Positive Positive  McMurray's Negative Negative     Neurovascular   Right Left  Quadriceps Strength 5/5 5/5  Hamstring Strength 5/5 5/5  Hip Abductor Strength 4/5 4/5  Distal Motor Normal Normal  Distal Sensory Normal light touch sensation Normal light touch sensation  Distal Pulses Normal Normal     Imaging Studies: I have reviewed AP, lateral,sunrise, and flexed PA weight bearing knee X-rays (5 views) of the bilateral knees ordered and taken today in the office show moderate degenerative changes in all 3 compartments in both knees.  There is medial lateral and patellofemoral narrowing with sclerosis osteophyte formation and an undulating surface.  Kellgren-Lawrence grade 3 in both knees.  No fractures  or dislocations noted in either knee.    Assessment:    ICD-10-CM  1. Pain in both knees, unspecified chronicity  M25.561   M25.562  Bilateral knee osteoarthritis  Plan: Based on the clinical and radiographic evaluation of the patient their primary symptomology is likely secondary to osteoarthritis in both knees.  I discussed the natural history and clinical course of osteoarthritis with the patient and reviewed their radiology and physical exam findings consistent with the disease process.  We discussed the treatment options for osteoarthritis including but not limited to; weight loss, home exercise program, anti-inflammatory medications, bracing, lifestyle modification, injection options including steroid and hyaluronic acid based injections, physical therapy, and the possibility of surgery.  A thorough discussion was held discussing the risks and benefits of each of these interventions and potential for symptomatic improvement.  The patient would like to move forward with a steroid injection in both knees today, anti-inflammatory treatment meloxicam  which I prescribed the patient advised on safe use of, and home exercise program which are provided to.   Will see how she does with these interventions and she will follow-up in 3 to 4 months for repeat evaluation and treatment.  All questions answered and she agrees to the above plan.  Lateral knee steroid injection    Consent After discussing the various treatment options for the condition,  It was agreed that a corticosteroid injection would be the next step in treatment.  The nature of and the indications for a corticosteroid and / or local anaesthetic injection were reviewed in detail with the patient today.  The inherent risks of injection including infection, allergic reaction, increased pain, incomplete relief or temporary relief of symptoms, alterations of blood glucose levels requiring careful monitoring and treatment as indicated, tendon, ligament or articular cartilage rupture or degeneration, nerve injury, skin depigmentation, and/or fatty atrophy were discussed.     Procedure After the risks and benefits of the procedure were explained, consent was given, and time-out was performed. The site for the injection was properly marked and prepped with Chlorhexadine/Isopropyl alcohol solution.      The injection site was anesthetized with ethyl chloride.  Each knee was injected with a 22 gauge 1.5 inch needle.  40 milligrams of Triamcinolone , 3 milliliters of 0.5% Bupivacaine , and 3 milliliters of 1% Lidocaine  using a sterile technique. During injection, there was unrestricted flow and care was taken not to inject corticosteroid into the skin or subcutaneous tissues.    A sterile band-aide was applied.  Post-injection instructions were given regarding post-procedure care, when to follow up in clinic and what to expect from the procedure.  The patient tolerated the injection well and was discharged without complication.     Portions of this record have been created using Scientist, clinical (histocompatibility and immunogenetics).  Dictation errors have been sought, but may not have been identified and corrected.  Large Joint  Injection: bilateral knee  Date/Time: 08/05/2023 3:30 PM  Performed by: Lorelle Arthea Arch, MD Authorized by: Lorelle Arthea Arch, MD   Needle Size:  22 G Location:  Knee Laterality:  Bilateral Site:  Bilateral knee Medications (Right):  3 mL BUPivacaine  HCl 0.5 %; 3 mL lidocaine  1 %; 40 mg triamcinolone  acetonide 40 mg/mL Medications (Left):  3 mL BUPivacaine  HCl 0.5 %; 3 mL lidocaine  1 %; 40 mg triamcinolone  acetonide 40 mg/mL   Zachary Aberman MD

## 2023-08-12 DIAGNOSIS — C50111 Malignant neoplasm of central portion of right female breast: Secondary | ICD-10-CM | POA: Diagnosis not present

## 2023-08-12 DIAGNOSIS — Z170 Estrogen receptor positive status [ER+]: Secondary | ICD-10-CM | POA: Diagnosis not present

## 2023-08-12 NOTE — Progress Notes (Signed)
 Hematology/Oncology Evaluation  Patient Name:  Melissa Shepherd Date of Birth:  Mar 23, 1970 Date of Encounter:  08/12/2023  Referring Provider:  No ref. 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 Status post right mastectomy performed by Dr. Joetta   Problem list: Patient Active Problem List  Diagnosis  . Anxiety  . Depression  . Dysmenorrhea  . Essential hypertension  . H/O abnormal mammogram  . History of abnormal cervical Pap smear  . Hyperlipidemia, mixed  . IBS (irritable bowel syndrome)  . Abnormal uterine bleeding (AUB)  . Osteoarthritis of knee  . Panic attacks  . Paresthesia  . Stable angina pectoris  . Tobacco use disorder  . Uterine fibroid  . Vitamin D  deficiency  . Malignant neoplasm of central portion of right breast in female, estrogen receptor positive (HCC)  . BRCA negative  . History of genetic counseling  . COVID-19  . Acquired absence of right breast  . Dehydration    Past Oncologic Therapy Oncology History  Malignant neoplasm of central portion of right breast in female, estrogen receptor positive (HCC)  12/14/2019 -  Cancer Staged   Staging form: Breast, AJCC 8th Edition - Clinical stage from 12/14/2019: Stage 0 (cTis (DCIS), cN0, cM0, ER+, PR: Not Assessed, HER2: Not Assessed)   12/14/2019 -  Cancer Staged   Staging form: Breast, AJCC 8th Edition - Clinical stage from 12/14/2019: Stage 0 (cTis (DCIS), cN0, cM0, ER+, PR: Not Assessed, HER2: Not Assessed) - Signed by Interface, Edi Beacon on 01/19/2020    12/24/2019 Initial Diagnosis   Malignant neoplasm of central portion of right breast in female, estrogen receptor positive (HCC)   01/27/2020 -  Cancer Staged   Staging form: Breast, AJCC 8th Edition - Pathologic stage from 01/27/2020:  Stage IA (pT1a, pN0(sn), cM0, G3, ER+, PR+, HER2-)   01/27/2020 -  Cancer Staged   Staging form: Breast, AJCC 8th Edition - Pathologic stage from 01/27/2020: Stage IA (pT1a, pN0(sn), cM0, G3, ER+, PR+, HER2-) - Signed by Interface, Edi Beacon on 02/02/2020    03/22/2020 - 02/13/2021 Chemotherapy   OP ONC Supportive Leuprolide  (LUPRON  DEPOT 7.5 MG) Every 4 Weeks Plan Provider: Sharma Cayetano Bathe, MD Treatment goal: Supportive Line of treatment: [No plan line of treatment]   08/22/2022 -  Supportive Treatment   Leuprolide  Acetate (LUPRON  DEPOT) 3.75 mg Every 28 Days - Breast Cancer or Fertility Preservation Plan Provider: Sharma MARLA Bathe, MD Treatment goal: Supportive Line of treatment: [No plan line of treatment]      History of Present Illness:    Melissa Shepherd is a  53 y.o. female who is seen in consultation at the request of  Melissa Shepherd* for an evaluation of new diagnosis of breast cancer.  Patient is a very pleasant 53 year old female who is perimenopausal.  She had screening mammogram performed through [facility]  and she was called back because of asymmetric density with calcifications spanning 3 cm in the right lateral breast.  On ultrasound she was found to have a 1.9 cm mass 1 cm lateral to the nipple with normal nodes.  She had a core needle biopsy performed  that confirmed a grade 3 DCIS.  Patient was seen by Dr. Oneil Melissa Shepherd.  He recommended genetic counseling which was performed at [facility].  On 01/27/2020 patient underwent right mastectomy  with immediate reconstruction.  The final pathology revealed a 3  mm grade 3 invasive ductal carcinoma with associated DCIS.  4 sentinel nodes negative for metastatic disease.  Breast prognostic panel showed tumor to be ER +50%, moderate intensity.  PR positive, 5%, weak intensity.  HER-2/neu negative with Ki-67 10%.   Postoperatively patient did develop complications from her reconstruction.  She has had infections of the  reconstructed site with open wound.  She is going to have her expander removed by Dr. Loris in the next day or so.  She is currently on antibiotics.   Otherwise she feels well no other complaints.    Treatment plan/history:  1.  Status post right mastectomy with immediate reconstruction on 01/27/2020.  Pathology revealed 3 mm grade 3 invasive ductal carcinoma with  associated DCIS.  4 sentinel nodes negative for metastatic disease.  Breast prognostic panel showed tumor to be ER +50%, moderate intensity.  PR positive, 5%, weak intensity.  HER-2/neu negative with Ki-67 10%.    2.  Initiation of Lupron  monthly March 20, 2020.    3.  Initiation of adjuvant anastrozole 1 mg daily, patient discontinued secondary to myalgias and arthralgias.    4.  Initiate letrozole 2.5 mg daily, 11/14/2020- 12/19/2020, discontinued secondary to myalgias and arthralgias    5.  Initiation of Aromasin  25 mg 12/19/2020     Current Therapy: Aromasin  25 mg daily  Interim Note: Melissa Shepherd returns today for follow up of breast cancer.  Patient is feeling well.  She is tolerating anastrozole well.  Has no nausea or vomiting no headaches double vision blurring of vision.  No fevers chills or night sweats.  Allergies: Ezetimibe and Prednisone  Medications: Current Outpatient Medications  Medication Sig Dispense Refill  . ALPRAZolam  (XANAX ) 1 mg tablet TAKE 1 TABLET BY MOUTH EVERY NIGHT AS NEEDED 30 tablet 0  . ascorbic acid (VITAMIN C) 250 mg tablet Take 250 mg by mouth Once Daily.    . celecoxib  (CeleBREX ) 100 mg capsule     . colestipol  (COLESTID ) 1 gram tablet Take 2 g by mouth Once Daily.    SABRA dextroamphetamine-amphetamine (ADDERALL) 20 mg tablet Take 20 mg by mouth 2 (two) times a day.  0  . exemestane  (AROMASIN ) 25 mg tablet TAKE 1 TABLET (25 MG TOTAL) BY MOUTH DAILY. 30 tablet 6  . folic acid  (FOLVITE ) 1 mg tablet Take 1 mg by mouth Once Daily.    . furosemide  (LASIX ) 40 mg tablet Take 40 mg by  mouth daily.    . hydroCHLOROthiazide  (HYDRODIURIL ) 12.5 mg tablet Take 12.5 mg by mouth Once Daily.  5  . isosorbide dinitrate (ISORDIL) 30 mg tablet     . losartan (COZAAR) 50 mg tablet Take 50 mg by mouth daily.    . meloxicam  (MOBIC ) 7.5 mg tablet Take 7.5 mg by mouth daily.    . metoprolol  succinate 100 mg CSpX daily.    . mupirocin  (BACTROBAN ) 2 % ointment APPLY A SMALL AMOUNT NASALLY EVERY 12 HOURS AS NEEDED    . oxyCODONE -acetaminophen  (PERCOCET) 5-325 mg per tablet Take 1 tablet by mouth every 6 (six) hours as needed.    . potassium chloride  20 mEq ER tablet Take 20 mEq by mouth 2 (two) times a day.    . pregabalin (LYRICA) 75 mg capsule TAKE 1 CAPSULE BY MOUTH 3 TIMES A DAY AS DIRECTED.    . rosuvastatin  (CRESTOR ) 40 mg tablet Take 40 mg by mouth nightly.     No  current facility-administered medications for this visit.     Past Medical History: Past Medical History:  Diagnosis Date  . Abnormal uterine bleeding (AUB)   . Acute right-sided low back pain with right-sided sciatica   . Anxiety   . Bilateral lower extremity edema   . Bilateral lower extremity pain   . Depression   . Dysmenorrhea   . Essential hypertension   . History of abnormal cervical Pap smear   . History of gastroesophageal reflux (GERD)   . Hyperlipidemia   . IBS (irritable bowel syndrome)   . Malignant neoplasm of central portion of right breast in female, estrogen receptor positive    (CMD) 12/24/2019  . Osteoarthritis of knee   . Panic attacks   . Paresthesia   . Shortness of breath on exertion   . Snores   . Tobacco use disorder   . Uterine fibroid   . Vitamin D  deficiency   . Witnessed apneic spells     Past Surgical History: Past Surgical History:  Procedure Laterality Date  . CESAREAN SECTION, UNSPECIFIED     Procedure: CESAREAN SECTION; X2  . CHOLECYSTECTOMY     Procedure: CHOLECYSTECTOMY  . COLONOSCOPY    . INTRAUTERINE DEVICE INSERTION     Procedure: INTRAUTERINE DEVICE  INSERTION; IUD HAS SINCE BEEN REMOVED  . MASTECTOMY    . MASTECTOMY W/ SENTINEL NODE BIOPSY Right 01/27/2020   Procedure: RIGHT TOTAL MASTECTOMY W/ SENTINEL NODE EXCISION;  Surgeon: Oneil Curtistine Cao, MD;  Location: [facility] MAIN OR;  Service: General;  Laterality: Right;  dr Melissa Shepherd came in to do the tissue expander placement and closure  . TISSUE EXPANDER PLACEMENT Right 01/27/2020   Procedure: (Right) TISSUE EXPANDER PLACEMENT BREAST & ACELLULAR DERMAL MATRIX PLACEMENT;  Surgeon: Dorthy Arlean loris, MD;  Location: [facility] MAIN OR;  Service: Plastics;  Laterality: Right;  . TISSUE EXPANDER REMOVAL Right 03/16/2020   Procedure: TISSUE EXPANDER REMOVAL;  Surgeon: Dorthy Arlean loris, MD;  Location: [facility] MAIN OR;  Service: Plastics;  Laterality: Right;  Previous Positive     Personal and Social History: Social History   Socioeconomic History  . Marital status: Married  Tobacco Use  . Smoking status: Some Days    Current packs/day: 0.25    Average packs/day: 0.3 packs/day for 40.3 years (10.4 ttl pk-yrs)    Types: Cigarettes    Start date: 02/25/1989  . Smokeless tobacco: Never  Substance and Sexual Activity  . Alcohol use: Not Currently    Alcohol/week: 20.0 standard drinks of alcohol  . Drug use: No  . Sexual activity: Not Currently    Partners: Male    Birth control/protection: Abstinence   Social Drivers of Health   Food Insecurity: No Food Insecurity (08/05/2023)   Received from [facility]   Food vital sign   . Within the past 12 months, you worried that your food would run out before you got money to buy more: Never true   . Within the past 12 months, the food you bought just didn't last and you didn't have money to get more: Never true  Transportation Needs: No Transportation Needs (08/05/2023)   Received from [facility]   [facility] - Transportation   . In the past 12 months, has lack of transportation kept you from medical appointments or from  getting medications?: No   . Lack of Transportation (Non-Medical): No  Safety: Low Risk  (01/21/2023)   Safety   . How often does anyone, including family  and friends, physically hurt you?: Never   . How often does anyone, including family and friends, insult or talk down to you?: Never   . How often does anyone, including family and friends, threaten you with harm?: Never   . How often does anyone, including family and friends, scream or curse at you?: Never  Living Situation: Low Risk  (08/05/2023)   Received from [facility] Situation   . In the last 12 months, was there a time when you were not able to pay the mortgage or rent on time?: No   . In the past 12 months, how many times have you moved where you were living?: 0   . At any time in the past 12 months, were you homeless or living in a shelter (including now)?: No    Family History: Cancer-related family history includes Breast cancer in her maternal grandmother and another family member; Cancer in her maternal grandmother, paternal aunt, and sister. There is no history of Ovarian cancer or Endometrial cancer. She indicated that her mother is alive. She indicated that her father is alive. She indicated that her sister is deceased. She indicated that her maternal grandmother is deceased. She indicated that her maternal grandfather is deceased. She indicated that her maternal aunt is alive. She indicated that her paternal aunt is alive. She indicated that the status of her neg hx is unknown. She indicated that the status of her other is unknown.    Physical Examination: Vital Signs: BP 144/86   Pulse 53   Temp 98.4 F (36.9 C)   Resp 16   Wt 88.9 kg (196 lb)   SpO2 100%   BMI 30.70 kg/m  General:  Healthy-appearing female in no acute distress. HEENT: Normocephalic, atraumatic. PERRLA, EOMI, sclera anicteric.  Nose without discharge.  Mouth and lips showed no lesions; oral mucosa is moist.  Neck  supple without masses. Lymphatic/Immunologic:  No cervical, axillary, or femoral adenopathy.  Cardiovascular:  RRR without significant murmurs, gallops, or rubs.  Heart not clinically enlarged.  No lower extremity edema.   Respiratory:  Chest clear to percussion and auscultation bilaterally; no respiratory distress. Patient speaks in complete sentences. Gastrointestinal:  Abdomen soft and without tenderness; no hepatosplenomegaly or other masses. No clinical ascites. Musculoskeletal:  No bony pain or tenderness. No tenosynovitis or joint effusions noted. Extremities:  No edema or suspicious rashes.  No cyanosis or clubbing. Skin:  No pathologic appearing petechiae or bruising noted. Neurologic: Alert and oriented to person, place, time and circumstance.  Strength and sensation are grossly intact.  Cranial nerves III through XII grossly intact.  Psychiatric:    Mood and affect are normal. Speech is fluent. Right mastectomy site is well-healed no evidence of recurrence.  Left breast no masses nipple discharge Assessment and plan:53 y.o. female with    1.  Stage I (T1N0) invasive ductal carcinoma of the right breast: Patient is status post mastectomy performed on 01/27/2020.  She did have immediate reconstruction with expander placement.  Her final pathology revealed a 3 mm focus of invasive ductal carcinoma  associated with area of DCIS.  It was a single focus of invasive carcinoma.  DCIS with comedo cribriform and solid pattern, nuclear grade 3.  Total of lymph nodes removed for with 1 sentinel node negative for metastatic disease.  Breast prognostic panel  showed tumor to be estrogen receptor positive,(50%, moderate intensity), progesterone receptor positive, (5%, weak intensity), HER-2/neu negative,(score=1+), Ki-67 10%.  Her  case was discussed at tumor board and due to small size of her tumor Oncotype  DX was not recommended.  Patient was on anastrozole.  She was initiated on Aromasin  25 mg daily as  she could not tolerate anastrozole or letrozole.  She understands risks benefits and side effects of Aromasin .      2.   We discussed that side effects of aromatase inhibitor therapy could include, but would not be limited to, hot flashes, joint  pains and bone loss. Should hot flashes or joint pains become problematic, we could certainly consider changing to a different AI or even tamoxifen. Therefore, she was encouraged to be in contact with us  regarding her symptoms. With regards to bone loss,  it is important to have routine bone density testing.  I have recommended a total of 5 to 7 years of therapy.     3.  Ovarian suppression: Patient will continue Lupron  injections on a monthly basis.  4.  Mammogram: Patient will need a mammogram for the left breast for this.  5.  Follow-up: Patient will return in 6 months with lab provider visit but she will return on a monthly basis for Lupron  injections.   A total of 30 minutes was spent in patient care today.  Greater than 50% of my time was spent face-to-face and nonface-to-face with the patient, reviewing her medical records, completing a physical examination, going over the results of the radiology reports, laboratory results, treatment of breast cancer, completing treatment plan and answering the patient and family's questions. Patient is encouraged to call with any questions or concerns if they should arise prior to next scheduled office visit.   This record has been created using Conservation officer, historic buildings.

## 2023-08-18 DIAGNOSIS — M47816 Spondylosis without myelopathy or radiculopathy, lumbar region: Secondary | ICD-10-CM | POA: Diagnosis not present

## 2023-08-18 DIAGNOSIS — M5416 Radiculopathy, lumbar region: Secondary | ICD-10-CM | POA: Diagnosis not present

## 2023-08-18 DIAGNOSIS — M160 Bilateral primary osteoarthritis of hip: Secondary | ICD-10-CM | POA: Diagnosis not present

## 2023-08-21 DIAGNOSIS — F900 Attention-deficit hyperactivity disorder, predominantly inattentive type: Secondary | ICD-10-CM | POA: Diagnosis not present

## 2023-08-21 DIAGNOSIS — F410 Panic disorder [episodic paroxysmal anxiety] without agoraphobia: Secondary | ICD-10-CM | POA: Diagnosis not present

## 2023-08-21 DIAGNOSIS — F411 Generalized anxiety disorder: Secondary | ICD-10-CM | POA: Diagnosis not present

## 2023-09-03 DIAGNOSIS — C50111 Malignant neoplasm of central portion of right female breast: Secondary | ICD-10-CM | POA: Diagnosis not present

## 2023-09-03 DIAGNOSIS — R92321 Mammographic fibroglandular density, right breast: Secondary | ICD-10-CM | POA: Diagnosis not present

## 2023-09-09 DIAGNOSIS — Z170 Estrogen receptor positive status [ER+]: Secondary | ICD-10-CM | POA: Diagnosis not present

## 2023-09-09 DIAGNOSIS — C50111 Malignant neoplasm of central portion of right female breast: Secondary | ICD-10-CM | POA: Diagnosis not present

## 2023-09-09 IMAGING — CT CT BIOPSY CORE RENAL
1 of 3 series · 13 of 32 positions shown, 18 images · non-contrast
Comparison: None.

INDICATION: Complex LEFT renal cystic mass

EXAM:
CT-GUIDED LEFT RENAL MASS BIOPSY
TECHNIQUE: Multidetector CT imaging of the abdomen was performed following the
standard protocol without IV contrast.
RADIATION DOSE REDUCTION: This exam was performed according to the
departmental dose-optimization program which includes automated
exposure control, adjustment of the mA and/or kV according to
patient size and/or use of iterative reconstruction technique.

[Series 2: i-spiral 5.0 bf37 · axial · 0.98mm/px · z∈[-581,-406]mm · 13 of 58 slices shown, 18 images]
[im 4/58  soft-tissue]
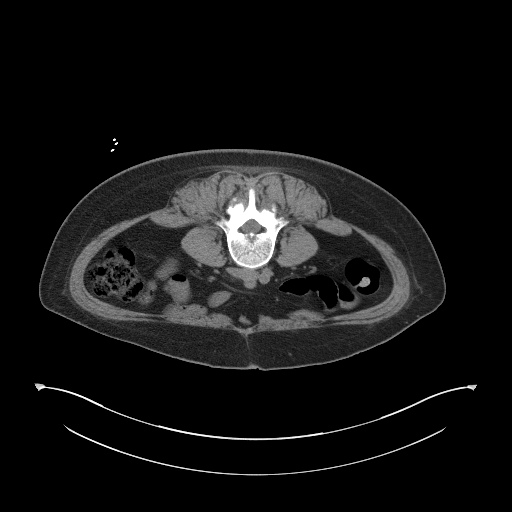
[im 4/58  bone]
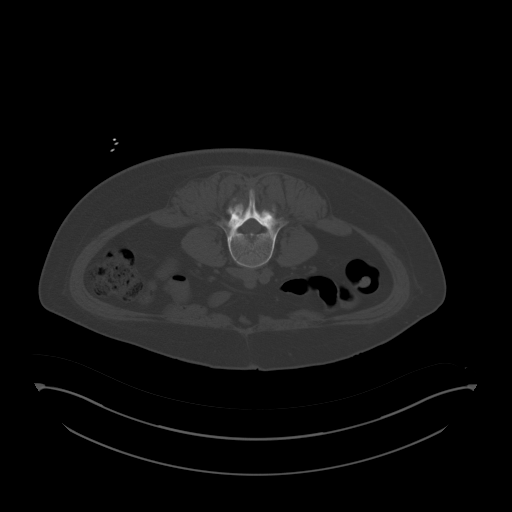
[im 8/58  soft-tissue]
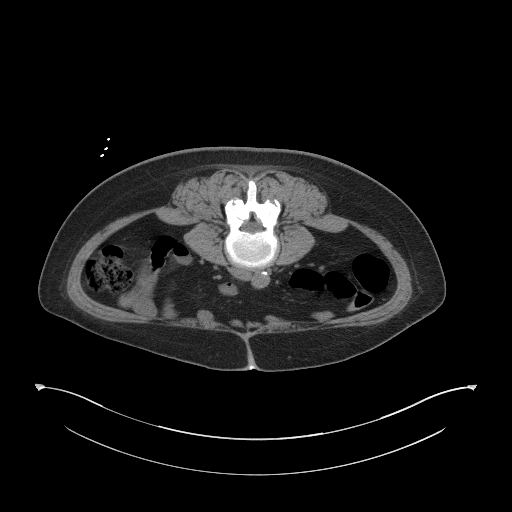
[im 15/58  soft-tissue]
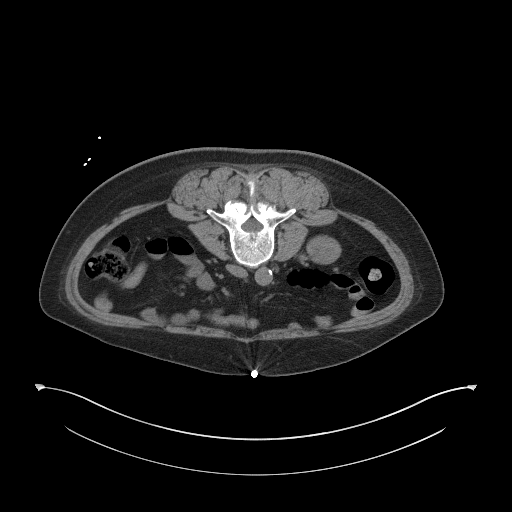
[im 18/58  soft-tissue]
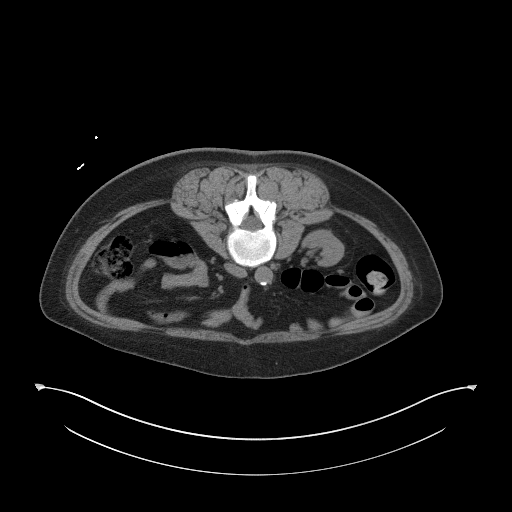
[im 22/58  soft-tissue]
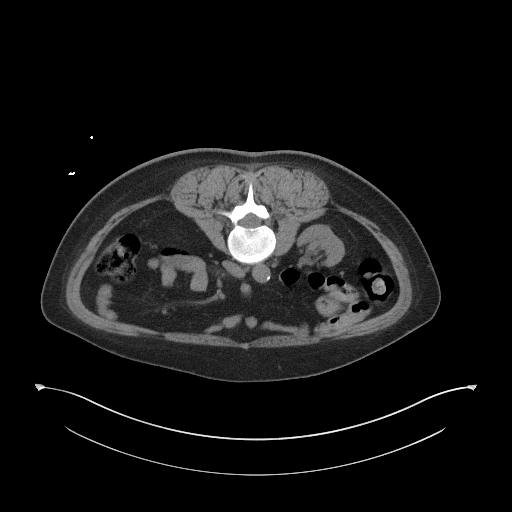
[im 25/58  soft-tissue]
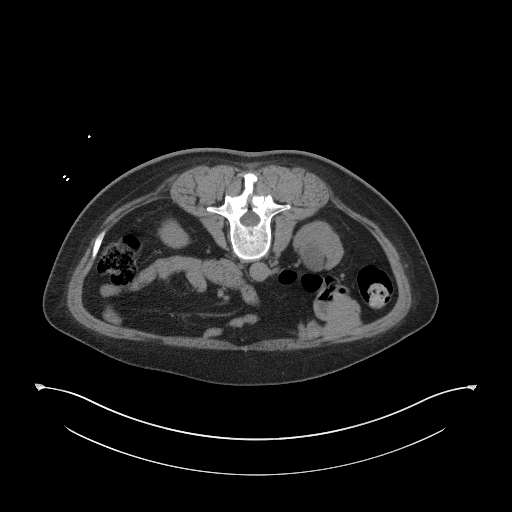
[im 33/58  soft-tissue]
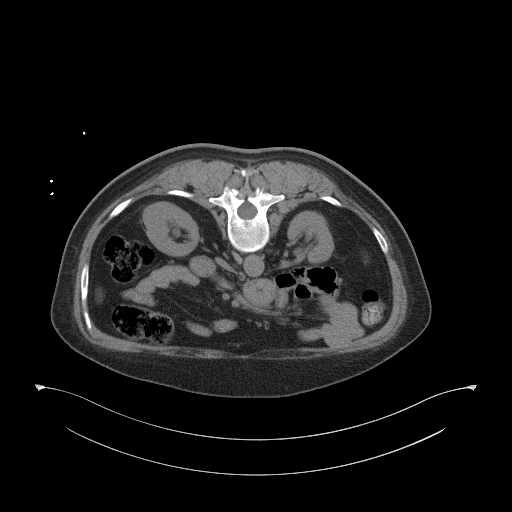
[im 36/58  soft-tissue]
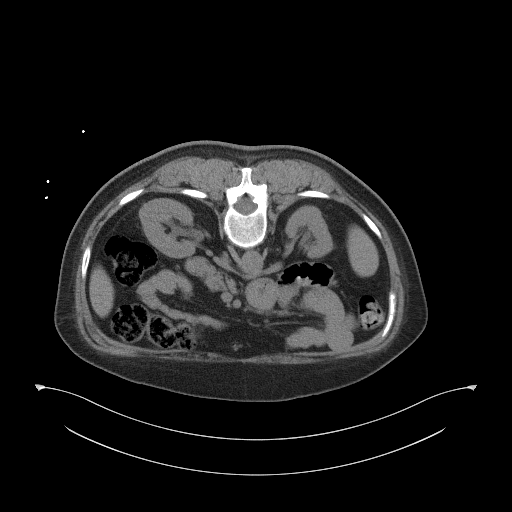
[im 40/58  soft-tissue]
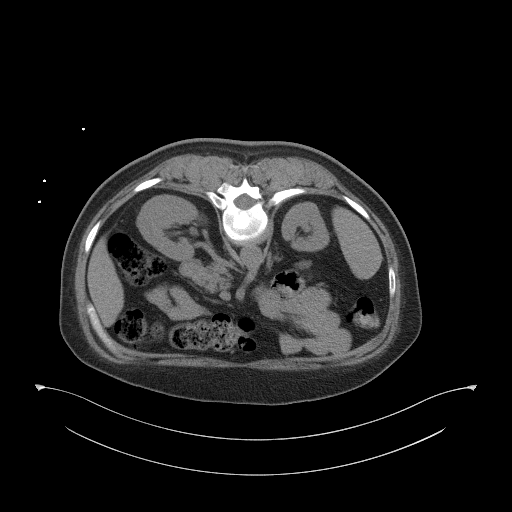
[im 40/58  bone]
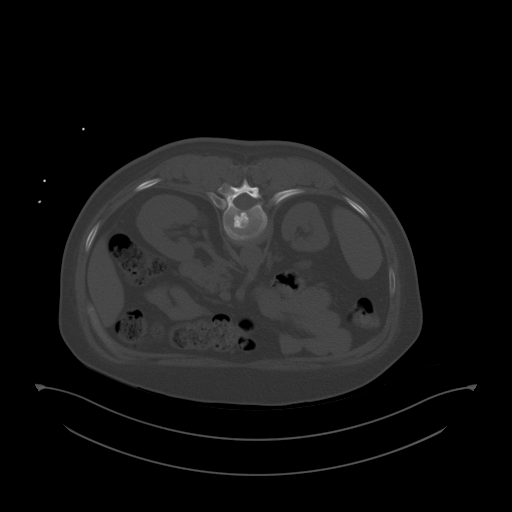
[im 43/58  soft-tissue]
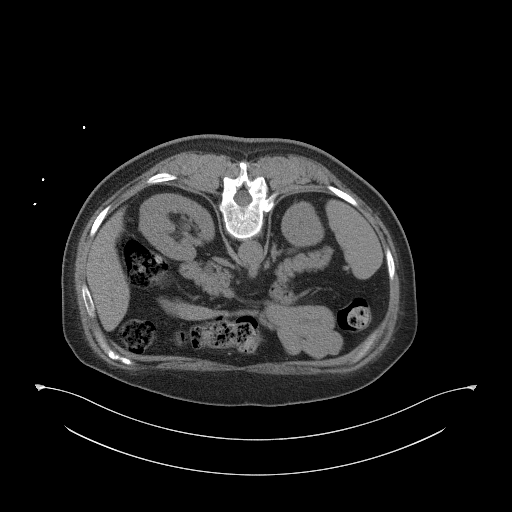
[im 43/58  lung]
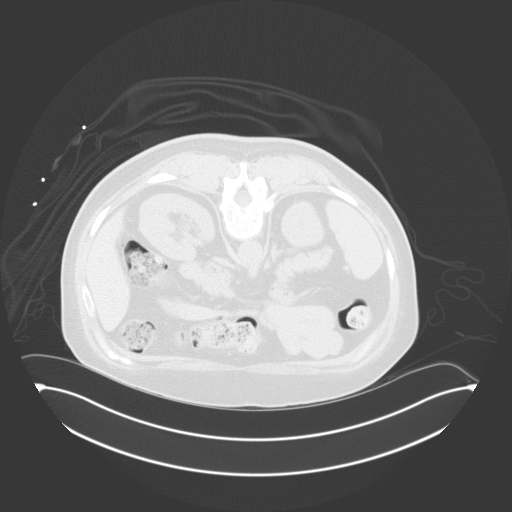
[im 47/58  lung]
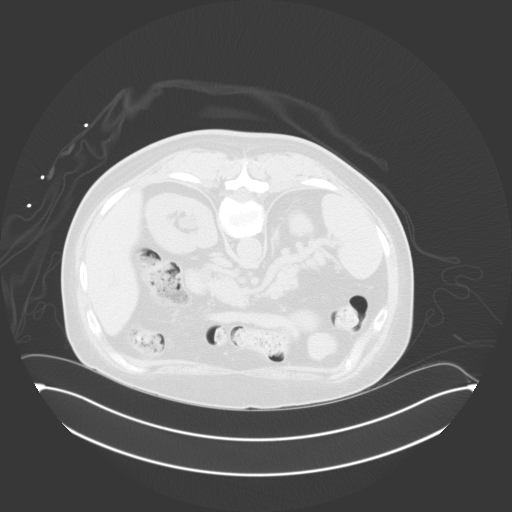
[im 50/58  soft-tissue]
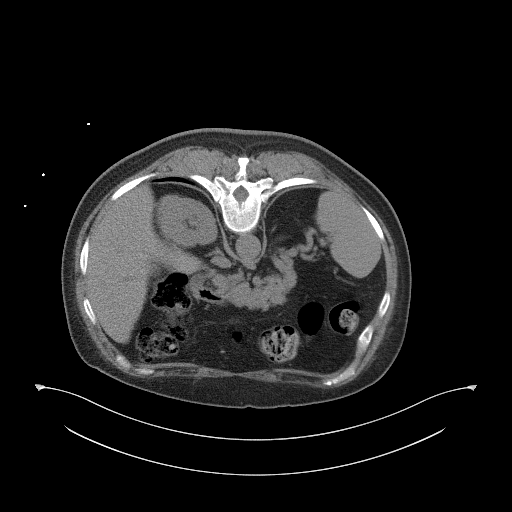
[im 50/58  lung]
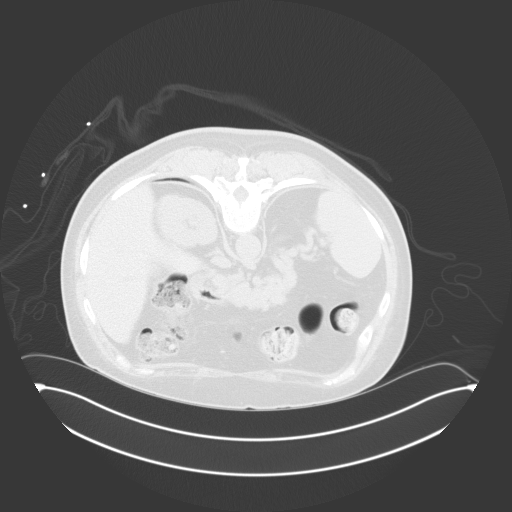
[im 54/58  soft-tissue]
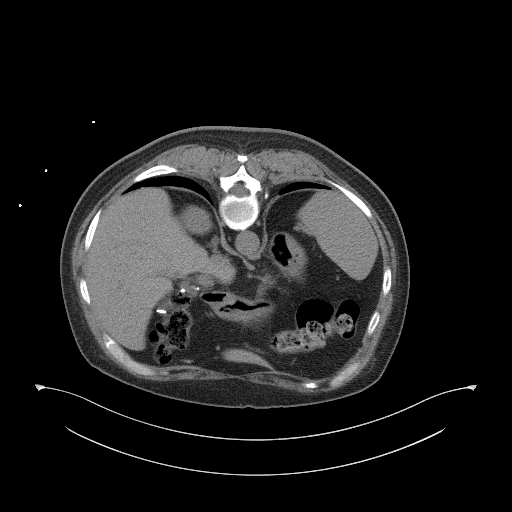
[im 54/58  lung]
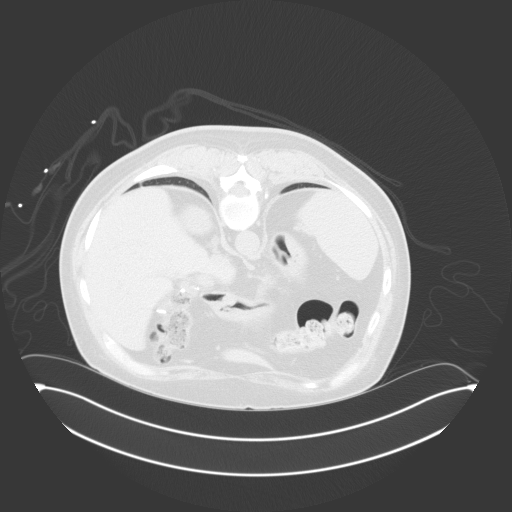

[13 of 32 positions shown; findings below may reference images not displayed]

MEDICATIONS:
None.

ANESTHESIA/SEDATION:
Moderate (conscious) sedation was employed during this procedure. A
total of Versed 4 mg and Fentanyl 100 mcg was administered
intravenously by the radiology nurse.

Total intra-service moderate Sedation Time: 24 minutes. The
patient's level of consciousness and vital signs were monitored
continuously by radiology nursing throughout the procedure under my
direct supervision.

CONTRAST:  None.

COMPLICATIONS:
None immediate.

PROCEDURE:
Informed consent was obtained from the patient following an
explanation of the procedure, risks, benefits and alternatives. A
time out was performed prior to the initiation of the procedure.

The patient was positioned prone on the CT table and a limited CT
was performed for procedural planning demonstrating exophytic, mid
polar LEFT renal mass. The procedure was planned. The operative site
was prepped and draped in the usual sterile fashion. Appropriate
trajectory was confirmed with a 22 gauge spinal needle after the
adjacent tissues were anesthetized with 1% Lidocaine with
epinephrine.

Under intermittent CT guidance, a 17 gauge coaxial needle was
advanced into the peripheral aspect of the mass.

Appropriate positioning was confirmed and 4 samples were obtained
with an 18 gauge core needle biopsy device. Hemostasis was achieved
with a Gelfoam slurry injection, then the co-axial needle was
removed. Superficial hemostasis was achieved with manual
compression.

A limited postprocedural CT was negative for hemorrhage or
additional complication. A dressing was placed. The patient
tolerated the procedure well without immediate postprocedural
complication.
IMPRESSION: Successful CT guided core needle biopsy of LEFT renal mass, as
above.

## 2023-09-10 DIAGNOSIS — M5416 Radiculopathy, lumbar region: Secondary | ICD-10-CM | POA: Diagnosis not present

## 2023-09-10 DIAGNOSIS — M48062 Spinal stenosis, lumbar region with neurogenic claudication: Secondary | ICD-10-CM | POA: Diagnosis not present

## 2023-09-28 ENCOUNTER — Inpatient Hospital Stay (HOSPITAL_COMMUNITY)
Admission: EM | Admit: 2023-09-28 | Discharge: 2023-10-01 | DRG: 176 | Disposition: A | Discharge status: Home / Self Care | Attending: Internal Medicine | Admitting: Internal Medicine

## 2023-09-28 ENCOUNTER — Emergency Department (HOSPITAL_COMMUNITY)

## 2023-09-28 ENCOUNTER — Other Ambulatory Visit: Payer: Self-pay

## 2023-09-28 ENCOUNTER — Encounter (HOSPITAL_COMMUNITY): Payer: Self-pay

## 2023-09-28 DIAGNOSIS — F17210 Nicotine dependence, cigarettes, uncomplicated: Secondary | ICD-10-CM | POA: Diagnosis present

## 2023-09-28 DIAGNOSIS — E860 Dehydration: Secondary | ICD-10-CM | POA: Diagnosis present

## 2023-09-28 DIAGNOSIS — R0989 Other specified symptoms and signs involving the circulatory and respiratory systems: Secondary | ICD-10-CM | POA: Diagnosis not present

## 2023-09-28 DIAGNOSIS — Z9049 Acquired absence of other specified parts of digestive tract: Secondary | ICD-10-CM

## 2023-09-28 DIAGNOSIS — G8929 Other chronic pain: Secondary | ICD-10-CM | POA: Diagnosis not present

## 2023-09-28 DIAGNOSIS — Z87891 Personal history of nicotine dependence: Secondary | ICD-10-CM

## 2023-09-28 DIAGNOSIS — M545 Low back pain, unspecified: Secondary | ICD-10-CM | POA: Diagnosis not present

## 2023-09-28 DIAGNOSIS — I10 Essential (primary) hypertension: Secondary | ICD-10-CM | POA: Diagnosis present

## 2023-09-28 DIAGNOSIS — Z888 Allergy status to other drugs, medicaments and biological substances status: Secondary | ICD-10-CM

## 2023-09-28 DIAGNOSIS — F419 Anxiety disorder, unspecified: Secondary | ICD-10-CM | POA: Diagnosis present

## 2023-09-28 DIAGNOSIS — R32 Unspecified urinary incontinence: Secondary | ICD-10-CM | POA: Diagnosis present

## 2023-09-28 DIAGNOSIS — D696 Thrombocytopenia, unspecified: Secondary | ICD-10-CM | POA: Diagnosis not present

## 2023-09-28 DIAGNOSIS — Z8619 Personal history of other infectious and parasitic diseases: Secondary | ICD-10-CM

## 2023-09-28 DIAGNOSIS — R911 Solitary pulmonary nodule: Secondary | ICD-10-CM | POA: Diagnosis present

## 2023-09-28 DIAGNOSIS — R0602 Shortness of breath: Secondary | ICD-10-CM | POA: Diagnosis not present

## 2023-09-28 DIAGNOSIS — Z7982 Long term (current) use of aspirin: Secondary | ICD-10-CM

## 2023-09-28 DIAGNOSIS — N281 Cyst of kidney, acquired: Secondary | ICD-10-CM | POA: Diagnosis present

## 2023-09-28 DIAGNOSIS — K760 Fatty (change of) liver, not elsewhere classified: Secondary | ICD-10-CM | POA: Diagnosis present

## 2023-09-28 DIAGNOSIS — Z9011 Acquired absence of right breast and nipple: Secondary | ICD-10-CM

## 2023-09-28 DIAGNOSIS — I2699 Other pulmonary embolism without acute cor pulmonale: Principal | ICD-10-CM | POA: Diagnosis present

## 2023-09-28 DIAGNOSIS — N289 Disorder of kidney and ureter, unspecified: Secondary | ICD-10-CM

## 2023-09-28 DIAGNOSIS — E042 Nontoxic multinodular goiter: Secondary | ICD-10-CM | POA: Diagnosis present

## 2023-09-28 DIAGNOSIS — E785 Hyperlipidemia, unspecified: Secondary | ICD-10-CM | POA: Diagnosis present

## 2023-09-28 DIAGNOSIS — K589 Irritable bowel syndrome without diarrhea: Secondary | ICD-10-CM | POA: Diagnosis present

## 2023-09-28 DIAGNOSIS — F32A Depression, unspecified: Secondary | ICD-10-CM | POA: Diagnosis present

## 2023-09-28 DIAGNOSIS — Z803 Family history of malignant neoplasm of breast: Secondary | ICD-10-CM

## 2023-09-28 DIAGNOSIS — M549 Dorsalgia, unspecified: Secondary | ICD-10-CM | POA: Diagnosis present

## 2023-09-28 DIAGNOSIS — F410 Panic disorder [episodic paroxysmal anxiety] without agoraphobia: Secondary | ICD-10-CM | POA: Diagnosis present

## 2023-09-28 DIAGNOSIS — M62838 Other muscle spasm: Secondary | ICD-10-CM | POA: Diagnosis present

## 2023-09-28 DIAGNOSIS — M25569 Pain in unspecified knee: Secondary | ICD-10-CM | POA: Diagnosis not present

## 2023-09-28 DIAGNOSIS — Z8249 Family history of ischemic heart disease and other diseases of the circulatory system: Secondary | ICD-10-CM

## 2023-09-28 DIAGNOSIS — E872 Acidosis, unspecified: Secondary | ICD-10-CM | POA: Diagnosis present

## 2023-09-28 DIAGNOSIS — R918 Other nonspecific abnormal finding of lung field: Secondary | ICD-10-CM | POA: Diagnosis not present

## 2023-09-28 DIAGNOSIS — Z853 Personal history of malignant neoplasm of breast: Secondary | ICD-10-CM

## 2023-09-28 DIAGNOSIS — J9811 Atelectasis: Secondary | ICD-10-CM | POA: Diagnosis present

## 2023-09-28 DIAGNOSIS — E876 Hypokalemia: Secondary | ICD-10-CM | POA: Diagnosis present

## 2023-09-28 DIAGNOSIS — E782 Mixed hyperlipidemia: Secondary | ICD-10-CM | POA: Diagnosis present

## 2023-09-28 DIAGNOSIS — Z79899 Other long term (current) drug therapy: Secondary | ICD-10-CM

## 2023-09-28 DIAGNOSIS — R071 Chest pain on breathing: Secondary | ICD-10-CM | POA: Diagnosis not present

## 2023-09-28 DIAGNOSIS — R0781 Pleurodynia: Secondary | ICD-10-CM | POA: Diagnosis present

## 2023-09-28 DIAGNOSIS — I471 Supraventricular tachycardia, unspecified: Secondary | ICD-10-CM | POA: Diagnosis not present

## 2023-09-28 DIAGNOSIS — Z79811 Long term (current) use of aromatase inhibitors: Secondary | ICD-10-CM

## 2023-09-28 LAB — MRSA NEXT GEN BY PCR, NASAL: MRSA by PCR Next Gen: NOT DETECTED

## 2023-09-28 LAB — CBC
HCT: 44.3 % (ref 36.0–46.0)
Hemoglobin: 14.9 g/dL (ref 12.0–15.0)
MCH: 30.2 pg (ref 26.0–34.0)
MCHC: 33.6 g/dL (ref 30.0–36.0)
MCV: 89.7 fL (ref 80.0–100.0)
Platelets: 165 K/uL (ref 150–400)
RBC: 4.94 MIL/uL (ref 3.87–5.11)
RDW: 13.8 % (ref 11.5–15.5)
WBC: 5.3 K/uL (ref 4.0–10.5)
nRBC: 0 % (ref 0.0–0.2)

## 2023-09-28 LAB — CBG MONITORING, ED: Glucose-Capillary: 99 mg/dL (ref 70–99)

## 2023-09-28 LAB — BASIC METABOLIC PANEL WITH GFR
Anion gap: 10 (ref 5–15)
BUN: 14 mg/dL (ref 6–20)
CO2: 29 mmol/L (ref 22–32)
Calcium: 9.1 mg/dL (ref 8.9–10.3)
Chloride: 101 mmol/L (ref 98–111)
Creatinine, Ser: 1.03 mg/dL — ABNORMAL HIGH (ref 0.44–1.00)
GFR, Estimated: >60 mL/min (ref >60)
Glucose, Bld: 89 mg/dL (ref 70–99)
Potassium: 4.3 mmol/L (ref 3.5–5.1)
Sodium: 140 mmol/L (ref 135–145)

## 2023-09-28 LAB — TROPONIN I (HIGH SENSITIVITY): Troponin I (High Sensitivity): 5 ng/L (ref <18)

## 2023-09-28 LAB — D-DIMER, QUANTITATIVE: D-Dimer, Quant: 1.61 ug{FEU}/mL — ABNORMAL HIGH (ref 0.00–0.50)

## 2023-09-28 MED ORDER — IOHEXOL 350 MG/ML SOLN
75.0000 mL | Freq: Once | INTRAVENOUS | Status: AC | PRN
  Administered 2023-09-28: 75 mL via INTRAVENOUS

## 2023-09-28 MED ORDER — LORAZEPAM 1 MG PO TABS
1.0000 mg | ORAL_TABLET | Freq: Once | ORAL | Status: AC
  Administered 2023-09-28: 1 mg via ORAL
  Filled 2023-09-28: qty 1

## 2023-09-28 MED ORDER — ALPRAZOLAM 0.5 MG PO TABS
1.0000 mg | ORAL_TABLET | Freq: Three times a day (TID) | ORAL | Status: DC
  Administered 2023-09-29 – 2023-10-01 (×7): 1 mg via ORAL
  Filled 2023-09-28 (×8): qty 2

## 2023-09-28 MED ORDER — HYDROCODONE-ACETAMINOPHEN 5-325 MG PO TABS: 1.0000 | ORAL_TABLET | Freq: Once | ORAL | Status: DC

## 2023-09-28 MED ORDER — OXYCODONE HCL 5 MG PO TABS
5.0000 mg | ORAL_TABLET | Freq: Four times a day (QID) | ORAL | Status: DC | PRN
  Administered 2023-09-28 – 2023-10-01 (×5): 5 mg via ORAL
  Filled 2023-09-28 (×5): qty 1

## 2023-09-28 MED ORDER — KETOROLAC TROMETHAMINE 15 MG/ML IJ SOLN
15.0000 mg | Freq: Three times a day (TID) | INTRAMUSCULAR | Status: DC | PRN
  Administered 2023-09-28: 15 mg via INTRAVENOUS
  Filled 2023-09-28: qty 1

## 2023-09-28 MED ORDER — ONDANSETRON HCL 4 MG/2ML IJ SOLN
4.0000 mg | Freq: Once | INTRAMUSCULAR | Status: AC
  Administered 2023-09-28: 4 mg via INTRAVENOUS
  Filled 2023-09-28: qty 2

## 2023-09-28 MED ORDER — LIDOCAINE 5 % EX PTCH: 1.0000 | MEDICATED_PATCH | CUTANEOUS | Status: DC

## 2023-09-28 MED ORDER — COLESTIPOL HCL 1 G PO TABS
2.0000 g | ORAL_TABLET | Freq: Two times a day (BID) | ORAL | Status: DC
  Administered 2023-09-28 – 2023-09-30 (×5): 2 g via ORAL
  Filled 2023-09-28 (×7): qty 2

## 2023-09-28 MED ORDER — NALOXONE HCL 0.4 MG/ML IJ SOLN: 0.4000 mg | INTRAMUSCULAR | Status: DC | PRN

## 2023-09-28 MED ORDER — IOHEXOL 350 MG/ML SOLN: 75.0000 mL | Freq: Once | INTRAVENOUS | Status: DC | PRN

## 2023-09-28 MED ORDER — SODIUM CHLORIDE 0.9 % IV BOLUS
500.0000 mL | Freq: Once | INTRAVENOUS | Status: AC
  Administered 2023-09-28: 500 mL via INTRAVENOUS

## 2023-09-28 MED ORDER — HYDROMORPHONE HCL 1 MG/ML IJ SOLN
0.5000 mg | Freq: Once | INTRAMUSCULAR | Status: AC
  Administered 2023-09-28: 0.5 mg via INTRAVENOUS
  Filled 2023-09-28: qty 1

## 2023-09-28 MED ORDER — HYDROCODONE-ACETAMINOPHEN 5-325 MG PO TABS
1.0000 | ORAL_TABLET | Freq: Once | ORAL | Status: AC
  Administered 2023-09-28: 1 via ORAL
  Filled 2023-09-28: qty 1

## 2023-09-28 MED ORDER — NICOTINE 14 MG/24HR TD PT24
14.0000 mg | MEDICATED_PATCH | Freq: Every day | TRANSDERMAL | Status: DC
  Administered 2023-09-28 – 2023-09-30 (×3): 14 mg via TRANSDERMAL
  Filled 2023-09-28 (×5): qty 1

## 2023-09-28 MED ORDER — METHOCARBAMOL 500 MG PO TABS: 1000.0000 mg | ORAL_TABLET | Freq: Once | ORAL | Status: DC

## 2023-09-28 MED ORDER — ENOXAPARIN SODIUM 100 MG/ML IJ SOSY
1.0000 mg/kg | PREFILLED_SYRINGE | Freq: Two times a day (BID) | INTRAMUSCULAR | Status: AC
  Administered 2023-09-28 – 2023-09-29 (×3): 82.5 mg via SUBCUTANEOUS
  Filled 2023-09-28 (×4): qty 0.82

## 2023-09-28 MED ORDER — ACETAMINOPHEN 325 MG PO TABS
650.0000 mg | ORAL_TABLET | Freq: Four times a day (QID) | ORAL | Status: DC | PRN
  Administered 2023-09-29 (×2): 650 mg via ORAL
  Filled 2023-09-28 (×2): qty 2

## 2023-09-28 MED ORDER — KETOROLAC TROMETHAMINE 15 MG/ML IJ SOLN
15.0000 mg | Freq: Three times a day (TID) | INTRAMUSCULAR | Status: DC | PRN
Start: 2023-09-28 — End: 2023-09-28

## 2023-09-28 MED ORDER — ACETAMINOPHEN 650 MG RE SUPP: 650.0000 mg | Freq: Four times a day (QID) | RECTAL | Status: DC | PRN

## 2023-09-28 NOTE — ED Provider Notes (Signed)
  Physical Exam  BP 91/62   Pulse 73   Temp 98 F (36.7 C) (Oral)   Resp 17   Ht 5' 7 (1.702 m)   Wt 83.5 kg   LMP 05/08/2017 (Approximate) Comment: neg preg test  SpO2 96%   BMI 28.82 kg/m   Physical Exam  Procedures  Procedures  ED Course / MDM    Medical Decision Making Amount and/or Complexity of Data Reviewed Labs: ordered. Radiology: ordered.  Risk Prescription drug management. Decision regarding hospitalization.   Pending CTA PE Left sided chest pain and left shoulder pain, pleuritic D-dimer positive Cardiac work up negative Has chronic back pain  CTA negative - discharge home  CTA positive for left segmental upper lobe PE.  PESI score 112 - class IV, high risk  Hospitalist paged for admission. Patient and husband updated on diagnosis.   Heparin per pharmacy protocol ordered. Per pharmacy, she is a candidate for lovenox  which is preferred.        Odell Balls, PA-C 09/29/23 2321    Tegeler, Lonni PARAS, MD 10/01/23 [phone]

## 2023-09-28 NOTE — ED Notes (Signed)
 Attempted to call the unit x2 to inform them of patients arrival and tubed med from pharm that came after pt left the unit. Calls unsuccessful.

## 2023-09-28 NOTE — Progress Notes (Signed)
 PHARMACY - ANTICOAGULATION CONSULT NOTE  Pharmacy Consult for Enoxparin Indication: pulmonary embolus  Allergies  Allergen Reactions   Ezetimibe Other (See Comments)    Unknown reaction    Prednisone Anxiety    Psychosis (intolerance)    Patient Measurements: Height: 5' 7 (170.2 cm) Weight: 83.5 kg (184 lb) IBW/kg (Calculated) : 61.6 HEPARIN DW (KG): 78.9  Vital Signs: Temp: 98.5 F (36.9 C) (08/03 1858) Temp Source: Oral (08/03 1858) BP: 113/78 (08/03 1855) Pulse Rate: 87 (08/03 1855)  Labs: Recent Labs    09/28/23 1229  HGB 14.9  HCT 44.3  PLT 165  CREATININE 1.03*  TROPONINIHS 5    Estimated Creatinine Clearance: 71 mL/min (A) (by C-G formula based on SCr of 1.03 mg/dL (H)).   Medical History: Past Medical History:  Diagnosis Date   Anginal pain (HCC)    Anxiety    panic attacks   Cancer (HCC)    Chicken pox    Cholelithiasis with acute on chronic cholecystitis without biliary obstruction 11/26/2016   Depression    Diarrhea    Dyspnea    with chest pain   Fatty liver    Frequent headaches    Hypertension    IBS (irritable bowel syndrome)    Multiple thyroid  nodules    Seizures (HCC)    pregenancy- toxemia- 1991    Assessment: 57 yof with a history of HTN, depression, anxiety, seizure, chronic low back pain, neoplasm of right breast presenting with back pain. CT imaging with small, non-occlusive PE in LU lobe. Enoxaparin  per pharmacy consult placed for same.  Hgb 14.9; plt 165 D-Dimer 1.61 Estimated Creatinine Clearance: 71 mL/min (A) (by C-G formula based on SCr of 1.03 mg/dL (H)).  Goal of Therapy:  Monitor platelets by anticoagulation protocol: Yes   Plan:  Start enoxaparin  SQ 1 mg/kg q12h Monitor renal function and CBC Monitor for s/s of hemorrhage F/u plan for [facility] post-enoxaparin   Dorn Buttner, PharmD, BCPS 09/28/2023 7:28 PM ED Clinical Pharmacist -  [phone]

## 2023-09-28 NOTE — ED Provider Notes (Signed)
 [place] [facility] AT [facility] Provider Note   CSN: [id number] Arrival date & time: 09/28/23  1107     Patient presents with: Back Pain   Melissa Shepherd is a 53 y.o. female.  With past medical history of hypertension, depression, anxiety, history of seizures, chronic low back pain, neoplasm of right breast reporting to ER with complaint of pain.  Patient reports that she has chronic back pain and chronic leg pain.  She reports she is seeing Dr. Malcolm with neurosurgery for this.  She reports that secondary to her pain she has been quite sedentary over the past 2 weeks.  Notes that yesterday she started experiencing pain over her left upper chest and shoulder.  This is associated with shortness of breath.  Pain is aggravated when taking a deep breath then.  Patient reports she just took a Xanax  to help has not tried anything for pain medicine.  Denies swelling in feet and ankles.  Denies history of PE.    Back Pain      Prior to Admission medications   Medication Sig Start Date End Date Taking? Authorizing Provider  ALPRAZolam  (XANAX ) 1 MG tablet Take 1 mg by mouth 3 (three) times daily.    [provider]  amphetamine-dextroamphetamine (ADDERALL) 20 MG tablet Take 20 mg by mouth 2 (two) times daily. 12/23/17   [provider]  aspirin  81 MG EC tablet Take 1 tablet (81 mg total) by mouth daily. Swallow whole. 06/22/21   Drusilla Sabas RAMAN, MD  colestipol  (COLESTID ) 1 g tablet Take 1-2 g by mouth 2 (two) times daily. 02/01/21   [provider]  exemestane  (AROMASIN ) 25 MG tablet TAKE 1 TABLET (25 MG TOTAL) BY MOUTH DAILY. 06/14/22   Iruku, Praveena, MD  folic acid  (FOLVITE ) 1 MG tablet Take 1 mg by mouth at bedtime. 12/29/20   [provider]  furosemide  (LASIX ) 20 MG tablet Take 20 mg by mouth daily. 09/10/21   [provider]  gabapentin  (NEURONTIN ) 300 MG capsule Take 300 mg by mouth 3 (three) times daily. 02/13/21   [provider]  hydrochlorothiazide  (HYDRODIURIL ) 12.5 MG tablet TAKE 1 TABLET BY MOUTH DAILY. Patient taking differently: Take 12.5 mg by mouth daily. 11/18/16   Maribeth Camellia MATSU, MD  lidocaine  (LIDODERM ) 5 % Place 1 patch onto the skin daily. Remove & Discard patch within 12 hours or as directed by MD 06/28/23   Henderly, Britni A, PA-C  metoprolol  succinate (TOPROL -XL) 100 MG 24 hr tablet Take 100 mg by mouth daily. 03/12/21   [provider]  mupirocin  ointment (BACTROBAN ) 2 % Apply 1 application. topically 3 (three) times daily. As directed for 5 days 07/13/21   Luiz Channel, MD  nystatin  (MYCOSTATIN ) 100000 UNIT/ML suspension Take 5 mLs (500,000 Units total) by mouth 4 (four) times daily. 11/04/21   Hazen Darryle FORBES, FNP  oxyCODONE -acetaminophen  (PERCOCET/ROXICET) 5-325 MG tablet Take 1 tablet by mouth every 6 (six) hours as needed for severe pain (pain score 7-10). 06/28/23   Henderly, Britni A, PA-C  potassium chloride  SA (KLOR-CON  M) 20 MEQ tablet Take 20 mEq by mouth at bedtime. 03/09/21   [provider]  rosuvastatin  (CRESTOR ) 40 MG tablet Take 40 mg by mouth at bedtime.    [provider]  traMADol  (ULTRAM ) 50 MG tablet Take 50-100 mg by mouth every 8 (eight) hours as needed. 10/05/21   [provider]    Allergies: Ezetimibe and Prednisone    Review  of Systems  Musculoskeletal:  Positive for back pain.    Updated Vital Signs BP 95/74 (BP Location: Left Arm)   Pulse 79   Temp 97.9 F (36.6 C) (Oral)   Resp 15   Ht 5' 7 (1.702 m)   Wt 83.5 kg   LMP 05/08/2017 (Approximate) Comment: neg preg test  SpO2 99%   BMI 28.82 kg/m   Physical Exam Vitals and nursing note reviewed.  Constitutional:      General: She is not in acute distress.    Appearance: She is not toxic-appearing.  HENT:     Head: Normocephalic and atraumatic.  Eyes:     General: No scleral icterus.    Conjunctiva/sclera: Conjunctivae normal.  Cardiovascular:     Rate and  Rhythm: Normal rate and regular rhythm.     Pulses: Normal pulses.     Heart sounds: Normal heart sounds.  Pulmonary:     Effort: Pulmonary effort is normal. No respiratory distress.     Breath sounds: Normal breath sounds.  Chest:     Chest wall: Tenderness present.  Abdominal:     General: Abdomen is flat. Bowel sounds are normal.     Palpations: Abdomen is soft.     Tenderness: There is no abdominal tenderness.  Musculoskeletal:     Right lower leg: No edema.     Left lower leg: No edema.     Comments: Patient has tenderness to palpation over left rhomboid muscle group.  Worse when taking deep breath then.  Skin:    General: Skin is warm and dry.     Findings: No lesion.  Neurological:     General: No focal deficit present.     Mental Status: She is alert and oriented to person, place, and time. Mental status is at baseline.     (all labs ordered are listed, but only abnormal results are displayed) Labs Reviewed  BASIC METABOLIC PANEL WITH GFR - Abnormal; Notable for the following components:      Result Value   Creatinine, Ser 1.03 (*)    All other components within normal limits  D-DIMER, QUANTITATIVE - Abnormal; Notable for the following components:   D-Dimer, Quant 1.61 (*)    All other components within normal limits  CBC  CBG MONITORING, ED  TROPONIN I (HIGH SENSITIVITY)    EKG: EKG Interpretation Date/Time:  Sunday September 28 2023 11:15:22 EDT Ventricular Rate:  81 PR Interval:  131 QRS Duration:  82 QT Interval:  346 QTC Calculation: 402 R Axis:   69  Text Interpretation: Sinus rhythm Left atrial enlargement Minimal ST depression, inferior leads when compared to prior, less artifact No STEMI Confirmed by Ginger Barefoot ([id number]) on 09/28/2023 1:50:52 PM  Radiology: No results found.   Procedures   Medications Ordered in the ED  LORazepam  (ATIVAN ) tablet 1 mg (has no administration in time range)  iohexol  (OMNIPAQUE ) 350 MG/ML injection 75 mL (has no  administration in time range)  ondansetron  (ZOFRAN ) injection 4 mg (4 mg Intravenous Given 09/28/23 1239)  HYDROcodone -acetaminophen  (NORCO/VICODIN) 5-325 MG per tablet 1 tablet (1 tablet Oral Given 09/28/23 1242)  HYDROcodone -acetaminophen  (NORCO/VICODIN) 5-325 MG per tablet 1 tablet (1 tablet Oral Given 09/28/23 1520)                                    Medical Decision Making Amount and/or Complexity of Data Reviewed Labs: ordered. Radiology: ordered.  Risk Prescription drug management.   This patient presents to the ED for concern of chest pain, this involves an extensive number of treatment options, and is a complaint that carries with it a high risk of complications and morbidity.  The differential diagnosis includes aortic dissection, PE, ACS   Co morbidities that complicate the patient evaluation  History of breast cancer Hypertension, hyperlipidemia Smoker Chronic back pain   Additional history obtained:  Additional history obtained from reviewed patient's somewhat recent emergency visit 06/28/2023 in which patient had an MRI.  Lab Tests:  I personally interpreted labs.  The pertinent results include:   No leukocytosis.  No anemia.  Troponin 5.  D-dimer elevated at 1.6   Imaging Studies ordered:  I ordered imaging studies including chest x-ray no acute findings.  CT T PE study ordered and pending at time of signout.   Cardiac Monitoring: / EKG:  The patient was maintained on a cardiac monitor.  I personally viewed and interpreted the cardiac monitored which showed an underlying rhythm of: Sinus, no STEMI, minor ST elevation in inferior leads   Problem List / ED Course / Critical interventions / Medication management  Patient presents with left-sided chest pain under her breast.  This started yesterday evening and has gradually worsened.  Aggravated by taking a deep breath then and is also tender to palpation.  She is hemodynamically stable and well-appearing.  She  did take a Xanax  before coming into the emergency room which she reports helped her symptoms.  Her D-dimer is elevated thus obtaining CT PE study to rule out pulmonary embolism. Of note back pain is chronic with no acute changes or fall.  She does report that she has urinary incontinence occasionally but this is not new.  Also has groin pain but denies specific numbness or tingling in the groin.  Again this is not new and no changes since her MRI from 06/28/2023. I ordered medication including Norco for pain Reevaluation of the patient after these medicines showed that the patient improved I have reviewed the patients home medicines and have made adjustments as needed Patient was signed off to oncoming ED PA at shift change.  Pending PE study     Final diagnoses:  None    ED Discharge Orders     None          Shermon Warren SAILOR, PA-C 09/28/23 1557    Tegeler, Lonni PARAS, MD 09/28/23 [phone]

## 2023-09-28 NOTE — ED Notes (Signed)
 Pt writhing and hollering out in discomfort, reporting pain to just below left breast and radiating everywhere. Provider to patient bedside several times. Pain medication ordered that's not going to help me lie flat! I can't do it this hurts too bad. I need a xanax  pt did agree to attempt CTA prior to antianxiolytic administration.

## 2023-09-28 NOTE — H&P (Signed)
 History and Physical    [name]:[id number] DOB: 01-Dec-1970 DOA: 09/28/2023  PCP: Donal Channing SQUIBB, FNP  Patient coming from: Home  Chief Complaint: Chest pain  HPI: Melissa Shepherd is a 53 y.o. female with medical history significant of breast cancer status post right mastectomy 01/2020, hypertension, hyperlipidemia, IBS, anxiety, depression, tobacco abuse presented to the ED with a chief complaint of left-sided chest pain.  Hemodynamically stable, not hypoxic.  No significant abnormalities on CBC and BMP.  Troponin negative, D-dimer positive.  EKG showing sinus rhythm and no acute ischemic changes.  CT angiogram chest showing a small nonocclusive segmental PE within the left upper lobe.  Bandlike densities in the lower lobes favored to represent atelectasis.  Possible trace left pleural effusion.  Showing an incompletely visualized cystic lesion of the left kidney with question solid enhancing area versus prominent cortical tissue.  Per radiologist, patient has history of complex cystic lesion on previously performed MRI from 2024 for which continued imaging follow-up was suggested.  Radiologist recommending dedicated outpatient abdominal MRI for further evaluation.  CT also showing a stable 7 mm subpleural left lower lobe pulmonary nodule since 2023.  Patient was started on therapeutic dose Lovenox  for PE.  Other medications administered include Norco, Dilaudid , lidocaine  patch, Ativan , Zofran , and 500 mL normal saline.  TRH called to admit.  Patient reports having left-sided pleuritic chest pain and shortness of breath for the past few days.  Her chest hurts whenever she takes a deep breath.  Denies any leg swelling.  Denies history of previous blood clots.  Reports history of breast cancer and she is currently on exemestane  and receives Lupron  injections.  Reports smoking 1 pack of cigarettes daily and has been doing so since she was 53 years old.  ED Course:  Review of Systems:   Review of Systems  All other systems reviewed and are negative.   Past Medical History:  Diagnosis Date   Anginal pain (HCC)    Anxiety    panic attacks   Cancer (HCC)    Chicken pox    Cholelithiasis with acute on chronic cholecystitis without biliary obstruction 11/26/2016   Depression    Diarrhea    Dyspnea    with chest pain   Fatty liver    Frequent headaches    Hypertension    IBS (irritable bowel syndrome)    Multiple thyroid  nodules    Seizures (HCC)    pregenancy- toxemia- 1991    Past Surgical History:  Procedure Laterality Date   CESAREAN SECTION  1991. 2003.   2   CHOLECYSTECTOMY N/A 11/26/2016   Procedure: LAPAROSCOPIC CHOLECYSTECTOMY WITH INTRAOPERATIVE CHOLANGIOGRAM;  Surgeon: Gail Favorite, MD;  Location: [facility] OR;  Service: General;  Laterality: N/A;   MASTECTOMY Right    WISDOM TOOTH EXTRACTION       reports that she has been smoking cigarettes. She has a 7.8 pack-year smoking history. She has never used smokeless tobacco. She reports that she does not currently use alcohol after a past usage of about 2.0 standard drinks of alcohol per week. She reports that she does not use drugs.  Allergies  Allergen Reactions   Ezetimibe Other (See Comments)    Unknown reaction    Prednisone Anxiety    Psychosis (intolerance)    Family History  Problem Relation Age of Onset   Hypertension Mother    Cancer Sister        cervical   Breast cancer Paternal Aunt  Breast cancer Maternal Grandmother    Cancer Maternal Grandmother        Breast    Prior to Admission medications   Medication Sig Start Date End Date Taking? Authorizing Provider  ALPRAZolam  (XANAX ) 1 MG tablet Take 1 mg by mouth 3 (three) times daily.    [provider]  amphetamine-dextroamphetamine (ADDERALL) 20 MG tablet Take 20 mg by mouth 2 (two) times daily. 12/23/17   [provider]  aspirin  81 MG EC tablet Take 1 tablet (81 mg total) by mouth daily. Swallow whole.  06/22/21   Drusilla Sabas RAMAN, MD  colestipol  (COLESTID ) 1 g tablet Take 1-2 g by mouth 2 (two) times daily. 02/01/21   [provider]  exemestane  (AROMASIN ) 25 MG tablet TAKE 1 TABLET (25 MG TOTAL) BY MOUTH DAILY. 06/14/22   Iruku, Praveena, MD  folic acid  (FOLVITE ) 1 MG tablet Take 1 mg by mouth at bedtime. 12/29/20   [provider]  furosemide  (LASIX ) 20 MG tablet Take 20 mg by mouth daily. 09/10/21   [provider]  gabapentin  (NEURONTIN ) 300 MG capsule Take 300 mg by mouth 3 (three) times daily. 02/13/21   [provider]  hydrochlorothiazide  (HYDRODIURIL ) 12.5 MG tablet TAKE 1 TABLET BY MOUTH DAILY. Patient taking differently: Take 12.5 mg by mouth daily. 11/18/16   Maribeth Camellia MATSU, MD  lidocaine  (LIDODERM ) 5 % Place 1 patch onto the skin daily. Remove & Discard patch within 12 hours or as directed by MD 06/28/23   Henderly, Britni A, PA-C  metoprolol  succinate (TOPROL -XL) 100 MG 24 hr tablet Take 100 mg by mouth daily. 03/12/21   [provider]  mupirocin  ointment (BACTROBAN ) 2 % Apply 1 application. topically 3 (three) times daily. As directed for 5 days 07/13/21   Luiz Channel, MD  nystatin  (MYCOSTATIN ) 100000 UNIT/ML suspension Take 5 mLs (500,000 Units total) by mouth 4 (four) times daily. 11/04/21   Hazen Darryle BRAVO, FNP  oxyCODONE -acetaminophen  (PERCOCET/ROXICET) 5-325 MG tablet Take 1 tablet by mouth every 6 (six) hours as needed for severe pain (pain score 7-10). 06/28/23   Henderly, Britni A, PA-C  potassium chloride  SA (KLOR-CON  M) 20 MEQ tablet Take 20 mEq by mouth at bedtime. 03/09/21   [provider]  rosuvastatin  (CRESTOR ) 40 MG tablet Take 40 mg by mouth at bedtime.    [provider]  traMADol  (ULTRAM ) 50 MG tablet Take 50-100 mg by mouth every 8 (eight) hours as needed. 10/05/21   [provider]    Physical Exam: Vitals:   09/28/23 1530 09/28/23 1600 09/28/23 1855 09/28/23 1858  BP: 103/66 94/62 113/78    Pulse: 76 77 87   Resp: 19 19 (!) 24   Temp:    98.5 F (36.9 C)  TempSrc:    Oral  SpO2: 100% 99% 98%   Weight:      Height:        Physical Exam Vitals reviewed.  Constitutional:      General: She is not in acute distress. HENT:     Head: Normocephalic and atraumatic.  Eyes:     Extraocular Movements: Extraocular movements intact.  Cardiovascular:     Rate and Rhythm: Normal rate and regular rhythm.     Pulses: Normal pulses.  Pulmonary:     Effort: Pulmonary effort is normal. No respiratory distress.     Breath sounds: No wheezing.  Abdominal:     General: Bowel sounds are normal. There is no distension.  Palpations: Abdomen is soft.     Tenderness: There is no abdominal tenderness. There is no guarding.  Musculoskeletal:     Cervical back: Normal range of motion.     Right lower leg: No edema.     Left lower leg: No edema.  Skin:    General: Skin is warm and dry.  Neurological:     General: No focal deficit present.     Mental Status: She is alert and oriented to person, place, and time.     Sensory: No sensory deficit.     Motor: No weakness.     Labs on Admission: I have personally reviewed following labs and imaging studies  CBC: Recent Labs  Lab 09/28/23 1229  WBC 5.3  HGB 14.9  HCT 44.3  MCV 89.7  PLT 165   Basic Metabolic Panel: Recent Labs  Lab 09/28/23 1229  NA 140  K 4.3  CL 101  CO2 29  GLUCOSE 89  BUN 14  CREATININE 1.03*  CALCIUM  9.1   GFR: Estimated Creatinine Clearance: 71 mL/min (A) (by C-G formula based on SCr of 1.03 mg/dL (H)). Liver Function Tests: No results for input(s): AST, ALT, ALKPHOS, BILITOT, PROT, ALBUMIN in the last 168 hours. No results for input(s): LIPASE, AMYLASE in the last 168 hours. No results for input(s): AMMONIA in the last 168 hours. Coagulation Profile: No results for input(s): INR, PROTIME in the last 168 hours. Cardiac Enzymes: No results for input(s): CKTOTAL,  CKMB, CKMBINDEX, TROPONINI in the last 168 hours. BNP (last 3 results) No results for input(s): PROBNP in the last 8760 hours. HbA1C: No results for input(s): HGBA1C in the last 72 hours. CBG: Recent Labs  Lab 09/28/23 1328  GLUCAP 99   Lipid Profile: No results for input(s): CHOL, HDL, LDLCALC, TRIG, CHOLHDL, LDLDIRECT in the last 72 hours. Thyroid  Function Tests: No results for input(s): TSH, T4TOTAL, FREET4, T3FREE, THYROIDAB in the last 72 hours. Anemia Panel: No results for input(s): VITAMINB12, FOLATE, FERRITIN, TIBC, IRON, RETICCTPCT in the last 72 hours. Urine analysis:    Component Value Date/Time   COLORURINE YELLOW (A) 03/31/2021 1945   APPEARANCEUR CLEAR (A) 03/31/2021 1945   LABSPEC <1.005 (L) 03/31/2021 1945   PHURINE 6.0 03/31/2021 1945   GLUCOSEU NEGATIVE 03/31/2021 1945   HGBUR NEGATIVE 03/31/2021 1945   BILIRUBINUR negative 06/27/2022 1805   KETONESUR negative 06/27/2022 1805   KETONESUR NEGATIVE 03/31/2021 1945   PROTEINUR negative 06/27/2022 1805   PROTEINUR NEGATIVE 03/31/2021 1945   UROBILINOGEN 0.2 06/27/2022 1805   NITRITE Negative 06/27/2022 1805   NITRITE NEGATIVE 03/31/2021 1945   LEUKOCYTESUR Small (1+) (A) 06/27/2022 1805   LEUKOCYTESUR NEGATIVE 03/31/2021 1945    Radiological Exams on Admission: CT Angio Chest PE W and/or Wo Contrast Result Date: 09/28/2023 CLINICAL DATA:  Chronic back and knee pain pain with deep breathing EXAM: CT ANGIOGRAPHY CHEST WITH CONTRAST TECHNIQUE: Multidetector CT imaging of the chest was performed using the standard protocol during bolus administration of intravenous contrast. Multiplanar CT image reconstructions and MIPs were obtained to evaluate the vascular anatomy. RADIATION DOSE REDUCTION: This exam was performed according to the departmental dose-optimization program which includes automated exposure control, adjustment of the mA and/or kV according to patient size  and/or use of iterative reconstruction technique. CONTRAST:  75mL OMNIPAQUE  IOHEXOL  350 MG/ML SOLN COMPARISON:  Chest x-ray 09/28/2023, CT lung bases 06/19/2021, 05/10/2017, cardiac CT 10/17/2022, MRI 08/01/2022 FINDINGS: Cardiovascular: Satisfactory opacification of the pulmonary arteries to the segmental level. Small non occlusive  segmental filling defects within left upper lobe pulmonary vessels, series 7, image 141 and series 7, image 157. Nonaneurysmal aorta. No dissection. Cardiac size is normal. No pericardial effusion Mediastinum/Nodes: Patent trachea. No thyroid  mass. No suspicious lymph nodes. Esophagus within normal limits. Lungs/Pleura: No pleural effusion or pneumothorax. Bandlike densities in the lower lobes favored to represent atelectasis. Subpleural left lower lobe pulmonary nodule measuring 7 mm on series 6, image 91, stable since April 2023. Upper Abdomen: No acute finding. Incompletely visualized cystic lesion of the left kidney with question solid enhancing area versus prominent cortical tissue on series 5, image 168. Patient has history of complex cystic lesion on previously performed MRI from 2023 for which imaging follow-up was recommended. Musculoskeletal: Post mastectomy changes on the right. No acute or suspicious bone lesion Review of the MIP images confirms the above findings. IMPRESSION: 1. Small non occlusive segmental pulmonary emboli within the left upper lobe. 2. Bandlike densities in the lower lobes favored to represent atelectasis. Possible trace left pleural effusion. 3. Incompletely visualized cystic lesion of the left kidney with question solid enhancing area versus prominent cortical tissue. Patient has history of complex cystic lesion on previously performed MRI from 2024 for which continued imaging follow-up was suggested. When the patient is clinically stable and able to follow directions and hold their breath (preferably as an outpatient) further evaluation with dedicated  abdominal MRI should be considered. 4. Stable 7 mm subpleural left lower lobe pulmonary nodule since 2023. No specific imaging follow-up is recommended. Critical Value/emergent results were called by telephone at the time of interpretation on 09/28/2023 at 6:44 pm to provider Dr. Neysa, Who verbally acknowledged these results. Electronically Signed   By: Luke Bun M.D.   On: 09/28/2023 18:44   DG Chest Portable 1 View Result Date: 09/28/2023 EXAM: 1 VIEW XRAY OF THE CHEST 09/28/2023 01:31:00 PM COMPARISON: 06/28/2023 low lung volumes. CLINICAL HISTORY: Per ED triage notes: Pt c/o chronic back and knee pain worse since Sunday. It hurts when I take a deep breath pt denies any chest pain at this time. I took a xanax  pt slurring words and rambling when describing problems. No n/v. No falls I've just been laying around and now it's worse FINDINGS: LUNGS AND PLEURA: Low lung volumes. No focal pulmonary opacity. No pulmonary edema. No pleural effusion. No pneumothorax. HEART AND MEDIASTINUM: No acute abnormality of the cardiac and mediastinal silhouettes. BONES AND SOFT TISSUES: No acute osseous abnormality. IMPRESSION: 1. No acute process. Electronically signed by: Waddell Calk MD 09/28/2023 01:37 PM EDT RP Workstation: [id number]    Assessment and Plan  Small nonocclusive segmental PE within the left upper lobe Risk factors include history of breast cancer for which she is currently on exemestane  and also receives Lupron  injections for ovarian suppression.  Cigarette smoking is also a risk factor.  Patient is currently hemodynamically stable.  Heart rate currently in the 80s, SBP in the 110s.  She is not hypoxic, satting in the high 90s on room air.  Troponin negative.  Check BNP.  Continue therapeutic dose Lovenox .  Bilateral lower extremity Dopplers ordered.  Continue management of pleuritic chest pain.  Continuous pulse ox.  Cystic lesion of left kidney CT angiogram chest showing an incompletely  visualized cystic lesion of the left kidney with question solid enhancing area versus prominent cortical tissue.  Per radiologist, patient has history of complex cystic lesion on previously performed MRI from 2024 for which continued imaging follow-up was suggested.  Radiologist recommending dedicated outpatient abdominal  MRI for further evaluation.  I have discussed CT findings with the patient and her husband and recommended close outpatient follow-up for repeat imaging.  Incidental pulmonary nodule CT showing a stable 7 mm subpleural left lower lobe pulmonary nodule since 2023.  She does have longstanding history of heavy cigarette smoking.  Discussed CT findings with the patient and her husband.  Recommended close outpatient follow-up for repeat imaging/continued monitoring.  Cigarette smoking Patient has been counseled to quit and NicoDerm patch ordered.  History of breast cancer Outpatient oncology follow-up.  Hypertension Hyperlipidemia Anxiety/depression Pharmacy med rec pending.  Code Status: Full Code (discussed with patient) Family Communication: Husband at bedside. Level of care: Telemetry bed Admission status: It is my clinical opinion that referral for OBSERVATION is reasonable and necessary in this patient based on the above information provided. The aforementioned taken together are felt to place the patient at high risk for further clinical deterioration. However, it is anticipated that the patient may be medically stable for discharge from the hospital within 24 to 48 hours.   Editha Ram MD [facility]  If 7PM-7AM, please contact night-coverage [email or web address]  09/28/2023, 7:34 PM

## 2023-09-28 NOTE — ED Triage Notes (Signed)
 Pt c/o chronic back and  knee pain worse since Sunday. It hurts when I take a deep breath pt denies any chest pain at this time. I took a xanax  pt slurring words and rambling when describing problems. No n/v. No falls I've just been laying around and now it's worse

## 2023-09-29 ENCOUNTER — Telehealth (HOSPITAL_COMMUNITY): Payer: Self-pay | Admitting: Pharmacy Technician

## 2023-09-29 ENCOUNTER — Observation Stay (HOSPITAL_BASED_OUTPATIENT_CLINIC_OR_DEPARTMENT_OTHER)

## 2023-09-29 ENCOUNTER — Other Ambulatory Visit (HOSPITAL_COMMUNITY): Payer: Self-pay

## 2023-09-29 ENCOUNTER — Encounter: Payer: Self-pay | Admitting: Hematology

## 2023-09-29 DIAGNOSIS — I2699 Other pulmonary embolism without acute cor pulmonale: Secondary | ICD-10-CM | POA: Diagnosis not present

## 2023-09-29 DIAGNOSIS — Z86711 Personal history of pulmonary embolism: Secondary | ICD-10-CM

## 2023-09-29 LAB — COMPREHENSIVE METABOLIC PANEL WITH GFR
ALT: 246 U/L — ABNORMAL HIGH (ref 0–44)
AST: 200 U/L — ABNORMAL HIGH (ref 15–41)
Albumin: 3 g/dL — ABNORMAL LOW (ref 3.5–5.0)
Alkaline Phosphatase: 183 U/L — ABNORMAL HIGH (ref 38–126)
Anion gap: 12 (ref 5–15)
BUN: 14 mg/dL (ref 6–20)
CO2: 21 mmol/L — ABNORMAL LOW (ref 22–32)
Calcium: 8.6 mg/dL — ABNORMAL LOW (ref 8.9–10.3)
Chloride: 103 mmol/L (ref 98–111)
Creatinine, Ser: 0.99 mg/dL (ref 0.44–1.00)
GFR, Estimated: >60 mL/min (ref >60)
Glucose, Bld: 137 mg/dL — ABNORMAL HIGH (ref 70–99)
Potassium: 4.3 mmol/L (ref 3.5–5.1)
Sodium: 136 mmol/L (ref 135–145)
Total Bilirubin: 1.7 mg/dL — ABNORMAL HIGH (ref 0.0–1.2)
Total Protein: 5.5 g/dL — ABNORMAL LOW (ref 6.5–8.1)

## 2023-09-29 LAB — HIV ANTIBODY (ROUTINE TESTING W REFLEX): HIV Screen 4th Generation wRfx: NONREACTIVE

## 2023-09-29 LAB — BRAIN NATRIURETIC PEPTIDE: B Natriuretic Peptide: 4.5 pg/mL (ref 0.0–100.0)

## 2023-09-29 LAB — CBC
HCT: 38 % (ref 36.0–46.0)
Hemoglobin: 12.8 g/dL (ref 12.0–15.0)
MCH: 29.9 pg (ref 26.0–34.0)
MCHC: 33.7 g/dL (ref 30.0–36.0)
MCV: 88.8 fL (ref 80.0–100.0)
Platelets: 150 K/uL (ref 150–400)
RBC: 4.28 MIL/uL (ref 3.87–5.11)
RDW: 14.2 % (ref 11.5–15.5)
WBC: 4.4 K/uL (ref 4.0–10.5)
nRBC: 0 % (ref 0.0–0.2)

## 2023-09-29 MED ORDER — CYCLOBENZAPRINE HCL 10 MG PO TABS
5.0000 mg | ORAL_TABLET | Freq: Every day | ORAL | Status: DC
  Administered 2023-09-29 – 2023-09-30 (×2): 5 mg via ORAL
  Filled 2023-09-29 (×2): qty 1

## 2023-09-29 MED ORDER — APIXABAN 5 MG PO TABS
10.0000 mg | ORAL_TABLET | Freq: Two times a day (BID) | ORAL | Status: DC
  Administered 2023-09-30 – 2023-10-01 (×3): 10 mg via ORAL
  Filled 2023-09-29 (×3): qty 2

## 2023-09-29 MED ORDER — APIXABAN 5 MG PO TABS: 5.0000 mg | ORAL_TABLET | Freq: Two times a day (BID) | ORAL | Status: DC

## 2023-09-29 MED ORDER — PROCHLORPERAZINE EDISYLATE 10 MG/2ML IJ SOLN
10.0000 mg | Freq: Once | INTRAMUSCULAR | Status: DC
  Filled 2023-09-29 (×2): qty 2

## 2023-09-29 MED ORDER — DIPHENHYDRAMINE HCL 50 MG/ML IJ SOLN
25.0000 mg | Freq: Once | INTRAMUSCULAR | Status: DC
  Filled 2023-09-29: qty 1

## 2023-09-29 NOTE — Discharge Instructions (Addendum)
 Information on my medicine - ELIQUIS  (apixaban )  This medication education was reviewed with me or my healthcare representative as part of my discharge preparation.  Why was Eliquis  prescribed for you? Eliquis  was prescribed to treat blood clots that may have been found in the veins of your legs (deep vein thrombosis) or in your lungs (pulmonary embolism) and to reduce the risk of them occurring again.  What do You need to know about Eliquis  ? The starting dose is 10 mg (two 5 mg tablets) taken TWICE daily for the FIRST SEVEN (7) DAYS, then on (enter date)  8/12  the dose is reduced to ONE 5 mg tablet taken TWICE daily.  Eliquis  may be taken with or without food.   Try to take the dose about the same time in the morning and in the evening. If you have difficulty swallowing the tablet whole please discuss with your pharmacist how to take the medication safely.  Take Eliquis  exactly as prescribed and DO NOT stop taking Eliquis  without talking to the doctor who prescribed the medication.  Stopping may increase your risk of developing a new blood clot.  Refill your prescription before you run out.  After discharge, you should have regular check-up appointments with your healthcare provider that is prescribing your Eliquis .    What do you do if you miss a dose? If a dose of ELIQUIS  is not taken at the scheduled time, take it as soon as possible on the same day and twice-daily administration should be resumed. The dose should not be doubled to make up for a missed dose.  Important Safety Information A possible side effect of Eliquis  is bleeding. You should call your healthcare provider right away if you experience any of the following: Bleeding from an injury or your nose that does not stop. Unusual colored urine (red or dark brown) or unusual colored stools (red or black). Unusual bruising for unknown reasons. A serious fall or if you hit your head (even if there is no bleeding).  Some  medicines may interact with Eliquis  and might increase your risk of bleeding or clotting while on Eliquis . To help avoid this, consult your healthcare provider or pharmacist prior to using any new prescription or non-prescription medications, including herbals, vitamins, non-steroidal anti-inflammatory drugs (NSAIDs) and supplements.  This website has more information on Eliquis  (apixaban ): [email or web address]

## 2023-09-29 NOTE — Plan of Care (Signed)

## 2023-09-29 NOTE — Telephone Encounter (Signed)
Patient Product/process development scientist completed.    The patient is insured through [facility].     Ran test claim for Eliquis 5 mg and the current 30 day co-pay is $4.00.  Ran test claim for Xarelto 20 mg and the current 30 day co-pay is $4.00.  This test claim was processed through [facility]- copay amounts may vary at other pharmacies due to pharmacy/plan contracts, or as the patient moves through the different stages of their insurance plan.     Roland Earl, CPHT Pharmacy Technician III Certified Patient Advocate [facility] Patient Advocate Team Direct Number: [phone]  Fax: [phone]

## 2023-09-29 NOTE — Progress Notes (Signed)
 Mobility Specialist Progress Note:   09/29/23 1600  Mobility  Activity Ambulated with assistance  Level of Assistance Standby assist, set-up cues, supervision of patient - no hands on  Assistive Device None  Distance Ambulated (ft) 400 ft  Activity Response Tolerated well  Mobility Referral Yes  Mobility visit 1 Mobility  Mobility Specialist Start Time (ACUTE ONLY) 1600  Mobility Specialist Stop Time (ACUTE ONLY) 1614  Mobility Specialist Time Calculation (min) (ACUTE ONLY) 14 min   Pt agreeable to mobility session. Required only supervision to ambulate in hallway. VSS on RA throughout. Pt c/o bilateral knee, back and LUQ pain. Encouraged frequent mobility. Left sitting EOB with all needs met.   Therisa Rana Mobility Specialist Please contact via SecureChat or  Rehab office at [phone]

## 2023-09-29 NOTE — Progress Notes (Signed)
 VASCULAR LAB    Bilateral lower extremity venous duplex has been performed.  See CV proc for preliminary results.   Rivaan Kendall, RVT 09/29/2023, 3:19 PM

## 2023-09-29 NOTE — Progress Notes (Signed)
 PROGRESS NOTE  Melissa Shepherd  FMW:[id number] DOB: [birth year] DOA: 09/28/2023 PCP: Donal Channing SQUIBB, FNP  Consultants  Brief Narrative: 53 y.o. female with medical history significant of breast cancer status post right mastectomy 01/2020, hypertension, hyperlipidemia, IBS, anxiety, depression, tobacco abuse presented to the ED with a chief complaint of left-sided chest pain.  Hemodynamically stable, not hypoxic.  No significant abnormalities on CBC and BMP.  Troponin negative, D-dimer positive.  EKG showing sinus rhythm and no acute ischemic changes.   CT angiogram chest showing a small nonocclusive segmental PE within the left upper lobe.  Bandlike densities in the lower lobes favored to represent atelectasis. Admitted for the same.     Assessment & Plan: Small nonocclusive segmental PE within the left upper lobe - Risk factors include history of breast cancer for which she is currently on exemestane  and also receives Lupron  injections for ovarian suppression.  Cigarette smoking is also a risk factor.   - Dopplers negative BL LE's - On lovenox  currently.  Per pharmacy, would be due for second therapeutic dose tonight and then switch to oral eliquis  tomorrow.  - Pulse in 90s, SBP borderline low.  90-100s systolic here, hx/o HTN on 3 antihypertensives plus diuretic outpt.  - Appears slightly dehydrated on exam-->will do IVF o/n and watch BP  Dehydration:  - dry mucus membranes - SBP borderline low - start IVF o/n.  Trend BPs - watch for rebound tachycardia in light of holding her home beta blocker - Low threshold to restart if pulse increases  Muscle spasm:  - has twinging muscle spasm that grabs ribs under Left breast - Different than the pleuritic pain she's been having, which is more constant.  - troponins/EKG normal.  BNP normal.  Doesn't appear to be cardiac chest pain.   - will provide flexeril  tonight for relief.    Transaminitis: - elevated bilirubin/AST/ALT/alk phos today.    - never elevated previously - denies excessive EtOH usage - borderline hypotension, but no systolic <90, so decreased perfusion less likely - Will trend, consult GI if remains elevated.   Metabolic acidosis: - non-anion gap - trend - starting IVF as above.  Kidney function good.     Cystic lesion of left kidney - CT angiogram chest showing an incompletely visualized cystic lesion of the left kidney with question solid enhancing area versus prominent cortical tissue.   - Per radiologist, patient has history of complex cystic lesion on previously performed MRI from 2024 for which continued imaging follow-up was suggested.  Radiologist recommending dedicated outpatient abdominal MRI for further evaluation.   Incidental pulmonary nodule CT showing a stable 7 mm subpleural left lower lobe pulmonary nodule since 2023.  She does have longstanding history of heavy cigarette smoking.   - CT findings of this and kidney have been discussed with the patient and her husband.  Recommended close outpatient follow-up for repeat imaging/continued monitoring-->carry through to discharge summary   Cigarette smoking - Patient has been counseled to quit and NicoDerm patch ordered.   History of breast cancer - Outpatient oncology follow-up.    DVT prophylaxis:   apixaban  (ELIQUIS ) tablet 10 mg  apixaban  (ELIQUIS ) tablet 5 mg  Code Status:   Code Status: Full Code Family Communication: husband at bedside all questions answered Level of care: Telemetry Cardiac Status is: Observation   Subjective: Patient overall feels much better than admit.  Still with some muscle spasms under Left breast and pleuritic pain when she takes a deep breath.  Otherwise denies  chest pain.  Hasn't really been out of bed to assess for dyspnea.   Objective: Vitals:   09/28/23 2320 09/29/23 0517 09/29/23 0747 09/29/23 1219  BP: 101/67 (!) 90/58 112/69 97/64  Pulse: 85 94 98 90  Resp: 20 18 18 20   Temp: 99 F (37.2 C)  99.8 F (37.7 C) 98.8 F (37.1 C) 98.8 F (37.1 C)  TempSrc: Oral Oral Oral Oral  SpO2: 91% 93% 94% 94%  Weight:      Height:        Intake/Output Summary (Last 24 hours) at 09/29/2023 1802 Last data filed at 09/29/2023 0900 Gross per 24 hour  Intake 240 ml  Output --  Net 240 ml   Filed Weights   09/28/23 1118 09/28/23 2033  Weight: 83.5 kg 83.7 kg   Body mass index is 28.9 kg/m.  Gen: 53 y.o. female in no apparent distress.  Nontoxic Pulm: Non-labored breathing.  Clear to auscultation bilaterally.  CV: Regular rate and rhythm. No murmur, rub, or gallop. No JVD Chest:  TTP directly under Left breast GI: Abdomen soft, non-tender, non-distended, with normoactive bowel sounds. No organomegaly or masses felt. Ext: Warm, no deformities, no pedal edema BL Skin: No rashes, lesions no ulcers Neuro: Alert and oriented. No focal neurological deficits. Psych: Calm  Judgement and insight appear normal. Mood & affect appropriate.     I have personally reviewed the following labs and images: CBC: Recent Labs  Lab 09/28/23 1229 09/29/23 1037  WBC 5.3 4.4  HGB 14.9 12.8  HCT 44.3 38.0  MCV 89.7 88.8  PLT 165 150   BMP &GFR Recent Labs  Lab 09/28/23 1229 09/29/23 0938  NA 140 136  K 4.3 4.3  CL 101 103  CO2 29 21*  GLUCOSE 89 137*  BUN 14 14  CREATININE 1.03* 0.99  CALCIUM  9.1 8.6*   Estimated Creatinine Clearance: 73.9 mL/min (by C-G formula based on SCr of 0.99 mg/dL). Liver & Pancreas: Recent Labs  Lab 09/29/23 0938  AST 200*  ALT 246*  ALKPHOS 183*  BILITOT 1.7*  PROT 5.5*  ALBUMIN 3.0*   No results for input(s): LIPASE, AMYLASE in the last 168 hours. No results for input(s): AMMONIA in the last 168 hours. Diabetic: No results for input(s): HGBA1C in the last 72 hours. Recent Labs  Lab 09/28/23 1328  GLUCAP 99   Cardiac Enzymes: No results for input(s): CKTOTAL, CKMB, CKMBINDEX, TROPONINI in the last 168 hours. No results for  input(s): PROBNP in the last 8760 hours. Coagulation Profile: No results for input(s): INR, PROTIME in the last 168 hours. Thyroid  Function Tests: No results for input(s): TSH, T4TOTAL, FREET4, T3FREE, THYROIDAB in the last 72 hours. Lipid Profile: No results for input(s): CHOL, HDL, LDLCALC, TRIG, CHOLHDL, LDLDIRECT in the last 72 hours. Anemia Panel: No results for input(s): VITAMINB12, FOLATE, FERRITIN, TIBC, IRON, RETICCTPCT in the last 72 hours. Urine analysis:    Component Value Date/Time   COLORURINE YELLOW (A) 03/31/2021 1945   APPEARANCEUR CLEAR (A) 03/31/2021 1945   LABSPEC <1.005 (L) 03/31/2021 1945   PHURINE 6.0 03/31/2021 1945   GLUCOSEU NEGATIVE 03/31/2021 1945   HGBUR NEGATIVE 03/31/2021 1945   BILIRUBINUR negative 06/27/2022 1805   KETONESUR negative 06/27/2022 1805   KETONESUR NEGATIVE 03/31/2021 1945   PROTEINUR negative 06/27/2022 1805   PROTEINUR NEGATIVE 03/31/2021 1945   UROBILINOGEN 0.2 06/27/2022 1805   NITRITE Negative 06/27/2022 1805   NITRITE NEGATIVE 03/31/2021 1945   LEUKOCYTESUR Small (1+) (A) 06/27/2022 1805  LEUKOCYTESUR NEGATIVE 03/31/2021 1945   Sepsis Labs: Invalid input(s): PROCALCITONIN, LACTICIDVEN  Microbiology: Recent Results (from the past 240 hours)  MRSA Next Gen by PCR, Nasal     Status: None   Collection Time: 09/28/23  9:25 PM   Specimen: Nasal Mucosa; Nasal Swab  Result Value Ref Range Status   MRSA by PCR Next Gen NOT DETECTED NOT DETECTED Final    Comment: (NOTE) The GeneXpert MRSA Assay (FDA approved for NASAL specimens only), is one component of a comprehensive MRSA colonization surveillance program. It is not intended to diagnose MRSA infection nor to guide or monitor treatment for MRSA infections. Test performance is not FDA approved in patients less than 65 years old. Performed at [facility], [address], [place], [place] [address]     Radiology Studies: VAS  US  LOWER EXTREMITY VENOUS (DVT) Result Date: 09/29/2023  Lower Venous DVT Study Patient Name:  Melissa Shepherd  Date of Exam:   09/29/2023 Medical Rec #: [id number]       Accession #:    [id number] Date of Birth: 1970/08/31        Patient Gender: F Patient Age:   52 years Exam Location:  [facility] Procedure:      VAS US  LOWER EXTREMITY VENOUS (DVT) Referring Phys: EDITHA RATHORE --------------------------------------------------------------------------------  Indications: Pulmonary embolism.  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 Breast cancer, status post right mastectomy 201, currently on exemestane  and receives Lupron  injections. Current tobacco use. Comparison Study: No prior LEV on file Performing Technologist: Alberta Lis RVS  Examination Guidelines: A complete evaluation includes B-mode imaging, spectral Doppler, color Doppler, and power Doppler as needed of all accessible portions of each vessel. Bilateral testing is considered an integral part of a complete examination. Limited examinations for reoccurring indications may be performed as noted. The reflux portion of the exam is performed with the patient in reverse Trendelenburg.  +---------+---------------+---------+-----------+----------+--------------+ RIGHT    CompressibilityPhasicitySpontaneityPropertiesThrombus Aging +---------+---------------+---------+-----------+----------+--------------+ CFV      Full           Yes      Yes                                 +---------+---------------+---------+-----------+----------+--------------+ SFJ      Full                                                        +---------+---------------+---------+-----------+----------+--------------+ FV Prox  Full                                                        +---------+---------------+---------+-----------+----------+--------------+ FV Mid   Full                                                         +---------+---------------+---------+-----------+----------+--------------+ FV DistalFull                                                        +---------+---------------+---------+-----------+----------+--------------+  PFV      Full                                                        +---------+---------------+---------+-----------+----------+--------------+ POP      Full           Yes      Yes                                 +---------+---------------+---------+-----------+----------+--------------+ PTV      Full                                                        +---------+---------------+---------+-----------+----------+--------------+ PERO     Full                                                        +---------+---------------+---------+-----------+----------+--------------+ Gastroc  Full                                                        +---------+---------------+---------+-----------+----------+--------------+   +---------+---------------+---------+-----------+----------+--------------+ LEFT     CompressibilityPhasicitySpontaneityPropertiesThrombus Aging +---------+---------------+---------+-----------+----------+--------------+ CFV      Full           Yes      Yes                                 +---------+---------------+---------+-----------+----------+--------------+ SFJ      Full                                                        +---------+---------------+---------+-----------+----------+--------------+ FV Prox  Full                                                        +---------+---------------+---------+-----------+----------+--------------+ FV Mid   Full                                                        +---------+---------------+---------+-----------+----------+--------------+ FV DistalFull                                                         +---------+---------------+---------+-----------+----------+--------------+  PFV      Full                                                        +---------+---------------+---------+-----------+----------+--------------+ POP      Full           Yes      Yes                                 +---------+---------------+---------+-----------+----------+--------------+ PTV      Full                                                        +---------+---------------+---------+-----------+----------+--------------+ PERO     Full                                                        +---------+---------------+---------+-----------+----------+--------------+    Summary: BILATERAL: - No evidence of deep vein thrombosis seen in the lower extremities, bilaterally. -No evidence of popliteal cyst, bilaterally.   *See table(s) above for measurements and observations.    Preliminary     Scheduled Meds:  ALPRAZolam   1 mg Oral TID   [START ON 09/30/2023] apixaban   10 mg Oral BID   Followed by   [name] ON 10/07/2023] apixaban   5 mg Oral BID   colestipol   2 g Oral BID   enoxaparin  (LOVENOX ) injection  1 mg/kg Subcutaneous Q12H   nicotine   14 mg Transdermal Daily   Continuous Infusions:   LOS: 0 days   35 minutes with more than 50% spent in reviewing records, counseling patient/family and coordinating care.  Reyes VEAR Gaw, MD [facility] [email or web address] 09/29/2023, 6:02 PM

## 2023-09-29 NOTE — Progress Notes (Signed)
 PHARMACY - ANTICOAGULATION CONSULT NOTE  Pharmacy Consult for Eliquis  Indication: pulmonary embolus  Allergies  Allergen Reactions   Ezetimibe Other (See Comments)    Unknown reaction    Prednisone Anxiety    Psychosis (intolerance)    Patient Measurements: Height: 5' 7 (170.2 cm) Weight: 83.7 kg (184 lb 8.4 oz) IBW/kg (Calculated) : 61.6 HEPARIN DW (KG): 79  Vital Signs: Temp: 98.8 F (37.1 C) (08/04 1219) Temp Source: Oral (08/04 1219) BP: 97/64 (08/04 1219) Pulse Rate: 90 (08/04 1219)  Labs: Recent Labs    09/28/23 1229 09/29/23 0938 09/29/23 1037  HGB 14.9  --  12.8  HCT 44.3  --  38.0  PLT 165  --  150  CREATININE 1.03* 0.99  --   TROPONINIHS 5  --   --     Estimated Creatinine Clearance: 73.9 mL/min (by C-G formula based on SCr of 0.99 mg/dL).   Medical History: Past Medical History:  Diagnosis Date   Anginal pain (HCC)    Anxiety    panic attacks   Cancer (HCC)    Chicken pox    Cholelithiasis with acute on chronic cholecystitis without biliary obstruction 11/26/2016   Depression    Diarrhea    Dyspnea    with chest pain   Fatty liver    Frequent headaches    Hypertension    IBS (irritable bowel syndrome)    Multiple thyroid  nodules    Seizures (HCC)    pregenancy- toxemia- 1991    Medications:  Medications Prior to Admission  Medication Sig Dispense Refill Last Dose/Taking   acetaminophen  (TYLENOL ) 500 MG tablet Take 500-1,000 mg by mouth every 6 (six) hours as needed for moderate pain (pain score 4-6).   Past Week   ALPRAZolam  (XANAX ) 1 MG tablet Take 0.5-1 mg by mouth 3 (three) times daily as needed for anxiety.   09/28/2023   amphetamine-dextroamphetamine (ADDERALL) 20 MG tablet Take 20 mg by mouth 2 (two) times daily as needed (ADHD).   Past Week   ascorbic acid (VITAMIN C) 500 MG tablet Take 500 mg by mouth daily.   Past Week   colestipol  (COLESTID ) 1 g tablet Take 2 g by mouth 2 (two) times daily.   Past Week   exemestane  (AROMASIN )  25 MG tablet TAKE 1 TABLET (25 MG TOTAL) BY MOUTH DAILY. 30 tablet 6 Past Week   folic acid  (FOLVITE ) 1 MG tablet Take 1 mg by mouth at bedtime.   Past Week   furosemide  (LASIX ) 40 MG tablet Take 40 mg by mouth daily.   Past Week   hydrochlorothiazide  (HYDRODIURIL ) 12.5 MG tablet TAKE 1 TABLET BY MOUTH DAILY. 30 tablet 7 Past Week   lidocaine  (LIDODERM ) 5 % Place 1 patch onto the skin daily. Remove & Discard patch within 12 hours or as directed by MD 30 patch 0 Past Month   losartan (COZAAR) 50 MG tablet Take 50 mg by mouth daily.   Past Week   meloxicam  (MOBIC ) 7.5 MG tablet Take 7.5 mg by mouth daily.   Past Week   metoprolol  succinate (TOPROL -XL) 100 MG 24 hr tablet Take 100 mg by mouth daily.   09/27/2023   mupirocin  ointment (BACTROBAN ) 2 % Apply 1 application. topically 3 (three) times daily. As directed for 5 days (Patient taking differently: Apply 1 application  topically 3 (three) times daily as needed (Dermatitis).) 22 g 0 Unknown   nitroGLYCERIN (NITROSTAT) 0.4 MG SL tablet Place 0.4 mg under the tongue every 5 (five) minutes  as needed for chest pain.   Past Week   potassium chloride  SA (KLOR-CON  M) 20 MEQ tablet Take 20 mEq by mouth at bedtime.   Past Week   rosuvastatin  (CRESTOR ) 40 MG tablet Take 40 mg by mouth at bedtime.   Past Week    Assessment: 53y.o. who presented with left-sided chest pain. Found to have a small PE in the left upper lobe. CT also showing a stable 7 mm subpleural left lower lobe pulmonary nodule since 2023.   Goal of Therapy:  Monitor signs and symptoms of bleeding Monitor platelets by anticoagulation protocol: Yes   Plan:  Eliquis  10mg  BID x7 days followed by 5mg  BID  Whittney Steenson M Faruq Rosenberger 09/29/2023,1:25 PM

## 2023-09-30 DIAGNOSIS — F32A Depression, unspecified: Secondary | ICD-10-CM | POA: Diagnosis not present

## 2023-09-30 DIAGNOSIS — Z79811 Long term (current) use of aromatase inhibitors: Secondary | ICD-10-CM | POA: Diagnosis not present

## 2023-09-30 DIAGNOSIS — F419 Anxiety disorder, unspecified: Secondary | ICD-10-CM | POA: Diagnosis not present

## 2023-09-30 DIAGNOSIS — E042 Nontoxic multinodular goiter: Secondary | ICD-10-CM | POA: Diagnosis not present

## 2023-09-30 DIAGNOSIS — D696 Thrombocytopenia, unspecified: Secondary | ICD-10-CM | POA: Diagnosis not present

## 2023-09-30 DIAGNOSIS — J9811 Atelectasis: Secondary | ICD-10-CM | POA: Diagnosis not present

## 2023-09-30 DIAGNOSIS — R32 Unspecified urinary incontinence: Secondary | ICD-10-CM | POA: Diagnosis not present

## 2023-09-30 DIAGNOSIS — R0781 Pleurodynia: Secondary | ICD-10-CM | POA: Diagnosis not present

## 2023-09-30 DIAGNOSIS — Z853 Personal history of malignant neoplasm of breast: Secondary | ICD-10-CM | POA: Diagnosis not present

## 2023-09-30 DIAGNOSIS — E860 Dehydration: Secondary | ICD-10-CM | POA: Diagnosis not present

## 2023-09-30 DIAGNOSIS — Z8249 Family history of ischemic heart disease and other diseases of the circulatory system: Secondary | ICD-10-CM | POA: Diagnosis not present

## 2023-09-30 DIAGNOSIS — G8929 Other chronic pain: Secondary | ICD-10-CM | POA: Diagnosis not present

## 2023-09-30 DIAGNOSIS — N281 Cyst of kidney, acquired: Secondary | ICD-10-CM | POA: Diagnosis not present

## 2023-09-30 DIAGNOSIS — I2699 Other pulmonary embolism without acute cor pulmonale: Secondary | ICD-10-CM | POA: Diagnosis not present

## 2023-09-30 DIAGNOSIS — I10 Essential (primary) hypertension: Secondary | ICD-10-CM | POA: Diagnosis not present

## 2023-09-30 DIAGNOSIS — F17210 Nicotine dependence, cigarettes, uncomplicated: Secondary | ICD-10-CM | POA: Diagnosis not present

## 2023-09-30 DIAGNOSIS — E876 Hypokalemia: Secondary | ICD-10-CM | POA: Diagnosis not present

## 2023-09-30 DIAGNOSIS — K760 Fatty (change of) liver, not elsewhere classified: Secondary | ICD-10-CM | POA: Diagnosis not present

## 2023-09-30 DIAGNOSIS — E785 Hyperlipidemia, unspecified: Secondary | ICD-10-CM | POA: Diagnosis not present

## 2023-09-30 DIAGNOSIS — Z9011 Acquired absence of right breast and nipple: Secondary | ICD-10-CM | POA: Diagnosis not present

## 2023-09-30 DIAGNOSIS — R911 Solitary pulmonary nodule: Secondary | ICD-10-CM | POA: Diagnosis not present

## 2023-09-30 DIAGNOSIS — E872 Acidosis, unspecified: Secondary | ICD-10-CM | POA: Diagnosis not present

## 2023-09-30 DIAGNOSIS — M549 Dorsalgia, unspecified: Secondary | ICD-10-CM | POA: Diagnosis not present

## 2023-09-30 DIAGNOSIS — I471 Supraventricular tachycardia, unspecified: Secondary | ICD-10-CM | POA: Diagnosis not present

## 2023-09-30 DIAGNOSIS — R0602 Shortness of breath: Secondary | ICD-10-CM | POA: Diagnosis not present

## 2023-09-30 LAB — CBC
HCT: 37.2 % (ref 36.0–46.0)
Hemoglobin: 12.7 g/dL (ref 12.0–15.0)
MCH: 30.2 pg (ref 26.0–34.0)
MCHC: 34.1 g/dL (ref 30.0–36.0)
MCV: 88.6 fL (ref 80.0–100.0)
Platelets: 147 K/uL — ABNORMAL LOW (ref 150–400)
RBC: 4.2 MIL/uL (ref 3.87–5.11)
RDW: 14 % (ref 11.5–15.5)
WBC: 4 K/uL (ref 4.0–10.5)
nRBC: 0 % (ref 0.0–0.2)

## 2023-09-30 LAB — COMPREHENSIVE METABOLIC PANEL WITH GFR
ALT: 181 U/L — ABNORMAL HIGH (ref 0–44)
AST: 95 U/L — ABNORMAL HIGH (ref 15–41)
Albumin: 2.9 g/dL — ABNORMAL LOW (ref 3.5–5.0)
Alkaline Phosphatase: 151 U/L — ABNORMAL HIGH (ref 38–126)
Anion gap: 8 (ref 5–15)
BUN: 18 mg/dL (ref 6–20)
CO2: 26 mmol/L (ref 22–32)
Calcium: 8.5 mg/dL — ABNORMAL LOW (ref 8.9–10.3)
Chloride: 104 mmol/L (ref 98–111)
Creatinine, Ser: 0.81 mg/dL (ref 0.44–1.00)
GFR, Estimated: >60 mL/min (ref >60)
Glucose, Bld: 115 mg/dL — ABNORMAL HIGH (ref 70–99)
Potassium: 3.3 mmol/L — ABNORMAL LOW (ref 3.5–5.1)
Sodium: 138 mmol/L (ref 135–145)
Total Bilirubin: 1.2 mg/dL (ref 0.0–1.2)
Total Protein: 5.6 g/dL — ABNORMAL LOW (ref 6.5–8.1)

## 2023-09-30 LAB — POTASSIUM: Potassium: 4.5 mmol/L (ref 3.5–5.1)

## 2023-09-30 LAB — MAGNESIUM: Magnesium: 2 mg/dL (ref 1.7–2.4)

## 2023-09-30 MED ORDER — METOPROLOL TARTRATE 50 MG PO TABS
50.0000 mg | ORAL_TABLET | Freq: Two times a day (BID) | ORAL | Status: DC
  Administered 2023-09-30: 50 mg via ORAL
  Filled 2023-09-30: qty 1

## 2023-09-30 MED ORDER — POTASSIUM CHLORIDE CRYS ER 20 MEQ PO TBCR
40.0000 meq | EXTENDED_RELEASE_TABLET | Freq: Once | ORAL | Status: AC
  Administered 2023-09-30: 40 meq via ORAL
  Filled 2023-09-30: qty 2

## 2023-09-30 MED ORDER — BUTALBITAL-APAP-CAFFEINE 50-325-40 MG PO TABS
1.0000 | ORAL_TABLET | Freq: Four times a day (QID) | ORAL | Status: DC | PRN
  Administered 2023-09-30: 1 via ORAL
  Filled 2023-09-30: qty 1

## 2023-09-30 NOTE — Progress Notes (Signed)
 PROGRESS NOTE Melissa Shepherd  FMW:[id number] DOB: 06/25/1970 DOA: 09/28/2023 PCP: Donal Channing SQUIBB, FNP  Brief Narrative/Hospital Course: 53 y.o. female with medical history significant of breast cancer status post right mastectomy 01/2020, hypertension, hyperlipidemia, IBS, anxiety, depression, tobacco abuse presented to the ED with a chief complaint of left-sided chest pain.  Hemodynamically stable, not hypoxic.  No significant abnormalities on CBC and BMP.  Troponin negative, D-dimer positive.  EKG showing sinus rhythm and no acute ischemic changes. CT angiogram chest showing a small nonocclusive segmental PE within the left upper lobe.Bandlike densities in the lower lobes favored to represent atelectasis. Admitted for the same  Subjective: Seen and examined today Patient complains of cough, also has chest pain mostly pleuritic.  Also having headache on the back, Overnight Tmax 100.6 11 PM last night, mild tachycardia BP stable on room air Labs reviewed mild hypokalemia 3.3 stable renal function AST ALT downtrending 95/181 TB normal 1.2 CBC with mild thrombocytopenia  Assessment and plan:  PE small nonocclusive segmental LUL: Patient with breast cancer currently on exemestane  AND Lupron  injections for ovarian suppression and cigarette smoker Dopplers negative BL LE's.  Admitted with Lovenox > transitioning to oral Eliquis . Encourage oral hydration, continue antitussive  NCPAP admitted, encourage mobilization Overnight fever likely from PE.  Monitor next 24 hours  Dehydration:  Encourage oral intake given IV fluids.  Holding beta-blockers   Muscle spasm:  has twinging muscle spasm that grabs ribs under Left breast, Likely pleuritic chest pain in the setting of patient's PE on LUL.  EKG and troponins normal BNP normal.  Continue pain management   Transaminitis: elevated bilirubin/AST/ALT/alk phos improving ?  In setting of PE with outpatient follow-up needed   Metabolic  acidosis: Hypokalemia: Improved bicarb, replace potassium.  Cystic lesion of left kidney CT angiogram chest showing an incompletely visualized cystic lesion of the left kidney with question solid enhancing area versus prominent cortical tissue.   Per radiologist, patient has history of complex cystic lesion on previously performed MRI from 2024 for which continued imaging follow-up was suggested.  Radiologist recommending dedicated outpatient abdominal MRI for further evaluation .    Incidental pulmonary nodule CT showing a stable 7 mm subpleural left lower lobe pulmonary nodule since 2023.  She does have longstanding history of heavy cigarette smoking. CT findings of this and kidney have been discussed with the patient and her husband.  Recommended close outpatient follow-up for repeat imaging/continued monitoring-->carry through to discharge summary   Cigarette smoking Encouraged tobacco cessation, nicotine  patches    History of breast cancer  Outpatient oncology follow-up.  Mobility: PT Orders:  PT Follow up Rec:    DVT prophylaxis:  Code Status:   Code Status: Full Code Family Communication: plan of care discussed with patient at bedside. Patient status is: Remains hospitalized because of severity of illness Level of care: Telemetry Cardiac   Dispo: The patient is from: home            Anticipated disposition: TBD, anticipate discharge next 24 to 48 hours Objective: Vitals last 24 hrs: Vitals:   09/29/23 2137 09/29/23 2255 09/30/23 0315 09/30/23 0700  BP: 109/75 102/68 102/72 100/63  Pulse: 93 99 95 80  Resp: (!) 22 (!) 24 (!) 23 20  Temp: 98.4 F (36.9 C) (!) 100.6 F (38.1 C) 99.4 F (37.4 C) 98.9 F (37.2 C)  TempSrc: Oral Oral Oral Oral  SpO2: 93% 93% 92% 93%  Weight:      Height:  Physical Examination: General exam: alert awake, oriented, older than stated age HEENT:Oral mucosa moist, Ear/Nose WNL grossly Respiratory system: Bilaterally clear BS  diminished bilaterally Cardiovascular system: S1 & S2 +, No JVD. Gastrointestinal system: Abdomen soft,NT,ND, BS+ Nervous System: Alert, awake, moving all extremities,and following commands. Extremities: LE edema neg, distal extremities warm.  Skin: No rashes,no icterus. MSK: Normal muscle bulk,tone, power   Medications reviewed:  Scheduled Meds:  ALPRAZolam   1 mg Oral TID   apixaban   10 mg Oral BID   Followed by   [name] ON 10/07/2023] apixaban   5 mg Oral BID   colestipol   2 g Oral BID   cyclobenzaprine   5 mg Oral QHS   diphenhydrAMINE   25 mg Intravenous Once   nicotine   14 mg Transdermal Daily   potassium chloride   40 mEq Oral Once   prochlorperazine   10 mg Intravenous Once   Continuous Infusions: Diet: Diet Order             Diet Heart Room service appropriate? Yes; Fluid consistency: Thin  Diet effective now                    Data Reviewed: I have personally reviewed following labs and imaging studies ( see epic result tab) CBC: Recent Labs  Lab 09/28/23 1229 09/29/23 1037 09/30/23 0304  WBC 5.3 4.4 4.0  HGB 14.9 12.8 12.7  HCT 44.3 38.0 37.2  MCV 89.7 88.8 88.6  PLT 165 150 147*   CMP: Recent Labs  Lab 09/28/23 1229 09/29/23 0938 09/30/23 0304  NA 140 136 138  K 4.3 4.3 3.3*  CL 101 103 104  CO2 29 21* 26  GLUCOSE 89 137* 115*  BUN 14 14 18   CREATININE 1.03* 0.99 0.81  CALCIUM  9.1 8.6* 8.5*   GFR: Estimated Creatinine Clearance: 90.3 mL/min (by C-G formula based on SCr of 0.81 mg/dL). Recent Labs  Lab 09/29/23 0938 09/30/23 0304  AST 200* 95*  ALT 246* 181*  ALKPHOS 183* 151*  BILITOT 1.7* 1.2  PROT 5.5* 5.6*  ALBUMIN 3.0* 2.9*   No results for input(s): LIPASE, AMYLASE in the last 168 hours. No results for input(s): AMMONIA in the last 168 hours. Coagulation Profile: No results for input(s): INR, PROTIME in the last 168 hours. Unresulted Labs (From admission, onward)    None       Antimicrobials/Microbiology: Anti-infectives (From admission, onward)    None         Component Value Date/Time   SDES URINE, CLEAN CATCH 06/27/2022 1814   SPECREQUEST  06/27/2022 1814    NONE Performed at [facility], [address], [place], [place] [address]    CULT 40,000 COLONIES/mL ESCHERICHIA COLI (A) 06/27/2022 1814   REPTSTATUS 06/29/2022 FINAL 06/27/2022 1814      Mennie LAMY, MD [facility] 09/30/2023, 11:24 AM

## 2023-09-30 NOTE — Plan of Care (Signed)

## 2023-09-30 NOTE — Hospital Course (Addendum)
 53 y.o. female with medical history significant of breast cancer status post right mastectomy 01/2020, hypertension, hyperlipidemia, IBS, anxiety, depression, tobacco abuse presented to the ED with a chief complaint of left-sided chest pain.  Hemodynamically stable, not hypoxic.  No significant abnormalities on CBC and BMP.  Troponin negative, D-dimer positive.  EKG showing sinus rhythm and no acute ischemic changes. CT angiogram chest showing a small nonocclusive segmental PE within the left upper lobe.Bandlike densities in the lower lobes favored to represent atelectasis. Admitted for the same  Subjective: Seen and examined today Patient complains of cough, also has chest pain mostly pleuritic.  Also having headache on the back, Overnight Tmax 100.6 11 PM last night, mild tachycardia BP stable on room air Labs reviewed mild hypokalemia 3.3 stable renal function AST ALT downtrending 95/181 TB normal 1.2 CBC with mild thrombocytopenia  Assessment and plan:  PE small nonocclusive segmental LUL: Patient with breast cancer currently on exemestane  AND Lupron  injections for ovarian suppression and cigarette smoker Dopplers negative BL LE's.  Admitted with Lovenox > transitioning to oral Eliquis . Encourage oral hydration, continue antitussive  NCPAP admitted, encourage mobilization Overnight fever likely from PE.  Monitor next 24 hours  Dehydration:  Encourage oral intake given IV fluids.  Holding beta-blockers   Muscle spasm:  has twinging muscle spasm that grabs ribs under Left breast, Likely pleuritic chest pain in the setting of patient's PE on LUL.  EKG and troponins normal BNP normal.  Continue pain management   Transaminitis: elevated bilirubin/AST/ALT/alk phos improving ?  In setting of PE with outpatient follow-up needed   Metabolic acidosis: Hypokalemia: Improved bicarb, replace potassium.  Cystic lesion of left kidney CT angiogram chest showing an incompletely visualized  cystic lesion of the left kidney with question solid enhancing area versus prominent cortical tissue.   Per radiologist, patient has history of complex cystic lesion on previously performed MRI from 2024 for which continued imaging follow-up was suggested.  Radiologist recommending dedicated outpatient abdominal MRI for further evaluation .    Incidental pulmonary nodule CT showing a stable 7 mm subpleural left lower lobe pulmonary nodule since 2023.  She does have longstanding history of heavy cigarette smoking. CT findings of this and kidney have been discussed with the patient and her husband.  Recommended close outpatient follow-up for repeat imaging/continued monitoring-->carry through to discharge summary   Cigarette smoking Encouraged tobacco cessation, nicotine  patches    History of breast cancer  Outpatient oncology follow-up.  Mobility: PT Orders:  PT Follow up Rec:    DVT prophylaxis:  Code Status:   Code Status: Full Code Family Communication: plan of care discussed with patient at bedside. Patient status is: Remains hospitalized because of severity of illness Level of care: Telemetry Cardiac   Dispo: The patient is from: home            Anticipated disposition: TBD, anticipate discharge next 24 to 48 hours Objective: Vitals last 24 hrs: Vitals:   09/29/23 2137 09/29/23 2255 09/30/23 0315 09/30/23 0700  BP: 109/75 102/68 102/72 100/63  Pulse: 93 99 95 80  Resp: (!) 22 (!) 24 (!) 23 20  Temp: 98.4 F (36.9 C) (!) 100.6 F (38.1 C) 99.4 F (37.4 C) 98.9 F (37.2 C)  TempSrc: Oral Oral Oral Oral  SpO2: 93% 93% 92% 93%  Weight:      Height:        Physical Examination: General exam: alert awake, oriented, older than stated age HEENT:Oral mucosa moist, Ear/Nose WNL grossly Respiratory system: Bilaterally  clear BS diminished bilaterally Cardiovascular system: S1 & S2 +, No JVD. Gastrointestinal system: Abdomen soft,NT,ND, BS+ Nervous System: Alert, awake, moving  all extremities,and following commands. Extremities: LE edema neg, distal extremities warm.  Skin: No rashes,no icterus. MSK: Normal muscle bulk,tone, power   Medications reviewed:  Scheduled Meds:  ALPRAZolam   1 mg Oral TID   apixaban   10 mg Oral BID   Followed by   [name] ON 10/07/2023] apixaban   5 mg Oral BID   colestipol   2 g Oral BID   cyclobenzaprine   5 mg Oral QHS   diphenhydrAMINE   25 mg Intravenous Once   nicotine   14 mg Transdermal Daily   potassium chloride   40 mEq Oral Once   prochlorperazine   10 mg Intravenous Once   Continuous Infusions: Diet: Diet Order             Diet Heart Room service appropriate? Yes; Fluid consistency: Thin  Diet effective now

## 2023-09-30 NOTE — Progress Notes (Signed)
 Mobility Specialist: Progress Note   09/30/23 1422  Mobility  Activity Ambulated with assistance  Level of Assistance Standby assist, set-up cues, supervision of patient - no hands on  Assistive Device None  Distance Ambulated (ft) 300 ft  Activity Response Tolerated well  Mobility Referral Yes  Mobility visit 1 Mobility  Mobility Specialist Start Time (ACUTE ONLY) 1125  Mobility Specialist Stop Time (ACUTE ONLY) 1150  Mobility Specialist Time Calculation (min) (ACUTE ONLY) 25 min    Pt received in bed, agreeable to mobility session. C/o chest pain upon sitting EOB and LUQ pain throughout ambulation. SV throughout. Ambulated to the BR and through the hallway without. Returned pt to EOB with all needs met, call bell in reach.   Melissa Shepherd Mobility Specialist Please contact via SecureChat or Rehab office at [phone]

## 2023-09-30 NOTE — TOC CM/SW Note (Addendum)
 Transition of Care [facility]) - Inpatient Brief Assessment   Patient Details  Name: TACORA ATHANAS MRN: [id number] Date of Birth: 05/04/1970  [facility]) CM/SW Contact:    Waddell Barnie Rama, RN Phone Number: 09/30/2023, 2:00 PM   Clinical Narrative: From home with spouse, has PCP and insurance on file, states has no HH services in place at this time , has rollator and a cane at home.  States family member will transport them home at [employer] and family is support system, states gets medications from [facility].  Pta self ambulatory.   There are no IP CM needs identified  at this time.  Please place consult for IP CM needs.  Will be on eliquis , she has medicaid so copay should be 4.00 .   Transition of Care Asessment: Insurance and Status: Insurance coverage has been reviewed Patient has primary care physician: Yes Home environment has been reviewed: home with spouse Prior level of function:: indep Prior/Current Home Services: Current home services (cane, rollator) Social Drivers of Health Review: SDOH reviewed no interventions necessary Readmission risk has been reviewed: Yes Transition of care needs: no transition of care needs at this time

## 2023-10-01 ENCOUNTER — Inpatient Hospital Stay (HOSPITAL_COMMUNITY)

## 2023-10-01 ENCOUNTER — Other Ambulatory Visit (HOSPITAL_COMMUNITY): Payer: Self-pay

## 2023-10-01 DIAGNOSIS — R0989 Other specified symptoms and signs involving the circulatory and respiratory systems: Secondary | ICD-10-CM | POA: Diagnosis not present

## 2023-10-01 DIAGNOSIS — R918 Other nonspecific abnormal finding of lung field: Secondary | ICD-10-CM | POA: Diagnosis not present

## 2023-10-01 DIAGNOSIS — I2699 Other pulmonary embolism without acute cor pulmonale: Secondary | ICD-10-CM | POA: Diagnosis not present

## 2023-10-01 DIAGNOSIS — R079 Chest pain, unspecified: Secondary | ICD-10-CM | POA: Diagnosis not present

## 2023-10-01 MED ORDER — CYCLOBENZAPRINE HCL 5 MG PO TABS
5.0000 mg | ORAL_TABLET | Freq: Every day | ORAL | 0 refills | Status: AC | PRN
  Filled 2023-10-01: qty 30, 30d supply, fill #0

## 2023-10-01 MED ORDER — METOPROLOL TARTRATE 50 MG PO TABS
50.0000 mg | ORAL_TABLET | Freq: Two times a day (BID) | ORAL | 0 refills | Status: AC
  Filled 2023-10-01: qty 60, 30d supply, fill #0

## 2023-10-01 MED ORDER — APIXABAN 5 MG PO TABS
ORAL_TABLET | ORAL | 0 refills | Status: AC
  Filled 2023-10-01: qty 70, 29d supply, fill #0

## 2023-10-01 MED ORDER — IBUPROFEN 200 MG PO TABS: 400.0000 mg | ORAL_TABLET | Freq: Once | ORAL | Status: DC | PRN

## 2023-10-01 MED ORDER — TRAMADOL HCL 50 MG PO TABS
50.0000 mg | ORAL_TABLET | Freq: Four times a day (QID) | ORAL | 0 refills | Status: AC | PRN
  Filled 2023-10-01: qty 20, 5d supply, fill #0

## 2023-10-01 NOTE — TOC Transition Note (Signed)
 Transition of Care [facility]) - Discharge Note   Patient Details  Name: Melissa Shepherd MRN: [id number] Date of Birth: [birth year]  Transition of Care [facility]) CM/SW Contact:  Roxie KANDICE Stain, RN Phone Number: 10/01/2023, 3:43 PM   Clinical Narrative:    Melissa Shepherd is stable to discharge home.  No TOC needs at this time.    Final next level of care: Home/Self Care Barriers to Discharge: Barriers Resolved   Patient Goals and CMS Choice Patient states their goals for this hospitalization and ongoing recovery are:: return home          Discharge Placement             home          Discharge Plan and Services Additional resources added to the After Visit Summary for                                       Social Drivers of Health (SDOH) Interventions SDOH Screenings   Food Insecurity: No Food Insecurity (09/30/2023)  Housing: Low Risk  (09/30/2023)  Transportation Needs: No Transportation Needs (09/30/2023)  Utilities: Not At Risk (09/30/2023)  Depression (PHQ2-9): Low Risk  (10/11/2021)  Financial Resource Strain: Low Risk  (08/05/2023)   Received from [facility]  Social Connections: Unknown (07/07/2021)   Received from [facility]  Tobacco Use: High Risk (09/28/2023)     Readmission Risk Interventions    10/01/2023    3:41 PM 09/30/2023    1:59 PM  Readmission Risk Prevention Plan  Post Dischage Appt Complete   Medication Screening Complete Complete  Transportation Screening Complete Complete

## 2023-10-01 NOTE — Progress Notes (Signed)
 PROGRESS NOTE Melissa Shepherd  FMW:[id number] DOB: [birth year] DOA: 09/28/2023 PCP: Donal Channing SQUIBB, FNP  Brief Narrative/Hospital Course: 53 y.o. female with medical history significant of breast cancer status post right mastectomy 01/2020, hypertension, hyperlipidemia, IBS, anxiety, depression, tobacco abuse presented to the ED with a chief complaint of left-sided chest pain.  Hemodynamically stable, not hypoxic.  No significant abnormalities on CBC and BMP.  Troponin negative, D-dimer positive.  EKG showing sinus rhythm and no acute ischemic changes. CT angiogram chest showing a small nonocclusive segmental PE within the left upper lobe.Bandlike densities in the lower lobes favored to represent atelectasis. Admitted and anticoagulated.  This patient was subsequently transition to oral Eliquis .  She continued to have chest pain and left side, Managed with pain medication and antispasmodic. Cxr done again and overall she is doing well on RA  Subjective: Seen and examined today Alert awake still having some chest pain on the left side Overnight afebrile BP soft in 90s tolerating her metoprolol   Did had brief SVT in 170s  yesterday and metoprolol  resumed 8/5 stable remains on room air  Assessment and plan:  PE small nonocclusive segmental LUL Pleuritic left-sided chest pain: Patient with breast cancer currently on exemestane  AND Lupron  injections for ovarian suppression and cigarette smoker Dopplers negative BL LE's.  Admitted with Lovenox > transitioned to oral Eliquis -continue with loading and maintenance dose subsequent Remains on room air but having chest pain continue pain management no more recurrence of fever.  Obtain chest x-ray, her EKG and troponin has been unremarkable so was BNP.  Dehydration Metabolic acidosis: Hypokalemia: Resolved   Transaminitis: elevated bilirubin/AST/ALT/alk phos improving. needs outpatient follow-up Cystic lesion of left kidney: CT angiogram chest showing  an incompletely visualized cystic lesion of the left kidney with question solid enhancing area versus prominent cortical tissue.   Per radiologist, patient has history of complex cystic lesion on previously performed MRI from 2024 for which continued imaging follow-up was suggested. She is aware of this and had f/u with Dr in > advised  Mri as OP when able to hold breath form Mri and she is agreeable   Incidental pulmonary nodule: CT showing a stable 7 mm subpleural left lower lobe pulmonary nodule since 2023.  She does have longstanding history of heavy cigarette smoking. CT findings of this and kidney have been discussed with the patient and her husband.  Recommended close outpatient follow-up for repeat imaging/continued monitoring-->carry through to discharge summary   Cigarette smoking Encouraged tobacco cessation, nicotine  patches.    History of breast cancer  Outpatient oncology follow-up.  Mobility: Patient has been mobile and ambulatory.  DVT prophylaxis: eliquis  Code Status:   Code Status: Full Code Family Communication: plan of care discussed with patient at bedside. Patient status is: Remains hospitalized because of severity of illness Level of care: Telemetry Cardiac   Dispo: The patient is from: home            Anticipated disposition: home today or tomorrow Objective: Vitals last 24 hrs: Vitals:   09/30/23 2231 10/01/23 0433 10/01/23 0742 10/01/23 0801  BP: (!) 95/58 90/63 98/64  98/64  Pulse: 75 81 82   Resp: (!) 26 11 18    Temp: 100 F (37.8 C) 98.9 F (37.2 C) 99.5 F (37.5 C)   TempSrc: Oral Oral Oral   SpO2: 94% 95%    Weight:      Height:       Physical Examination: General exam: alert awake oriented x 3.  On room air pleasant  HEENT:Oral mucosa moist, Ear/Nose WNL grossly Respiratory system: b/l clear breath sounds, diminished bilaterally Cardiovascular system: S1 & S2 +, No JVD. Gastrointestinal system: Abdomen soft,NT,ND, BS+ Nervous System:  Alert, awake, moving all extremities,and following commands. Extremities: LE edema neg, distal extremities warm.  Skin: No rashes,no icterus. MSK: Normal muscle bulk,tone, power   Medications reviewed:  Scheduled Meds:  ALPRAZolam   1 mg Oral TID   apixaban   10 mg Oral BID   Followed by   [name] ON 10/07/2023] apixaban   5 mg Oral BID   colestipol   2 g Oral BID   cyclobenzaprine   5 mg Oral QHS   diphenhydrAMINE   25 mg Intravenous Once   metoprolol  tartrate  50 mg Oral BID   nicotine   14 mg Transdermal Daily   prochlorperazine   10 mg Intravenous Once   Continuous Infusions: Diet: Diet Order             Diet Heart Room service appropriate? Yes; Fluid consistency: Thin  Diet effective now                    Data Reviewed: I have personally reviewed following labs and imaging studies ( see epic result tab) CBC: Recent Labs  Lab 09/28/23 1229 09/29/23 1037 09/30/23 0304  WBC 5.3 4.4 4.0  HGB 14.9 12.8 12.7  HCT 44.3 38.0 37.2  MCV 89.7 88.8 88.6  PLT 165 150 147*   CMP: Recent Labs  Lab 09/28/23 1229 09/29/23 0938 09/30/23 0304 09/30/23 1702  NA 140 136 138  --   K 4.3 4.3 3.3* 4.5  CL 101 103 104  --   CO2 29 21* 26  --   GLUCOSE 89 137* 115*  --   BUN 14 14 18   --   CREATININE 1.03* 0.99 0.81  --   CALCIUM  9.1 8.6* 8.5*  --   MG  --   --   --  2.0   GFR: Estimated Creatinine Clearance: 90.3 mL/min (by C-G formula based on SCr of 0.81 mg/dL). Recent Labs  Lab 09/29/23 0938 09/30/23 0304  AST 200* 95*  ALT 246* 181*  ALKPHOS 183* 151*  BILITOT 1.7* 1.2  PROT 5.5* 5.6*  ALBUMIN 3.0* 2.9*   No results for input(s): LIPASE, AMYLASE in the last 168 hours. No results for input(s): AMMONIA in the last 168 hours. Coagulation Profile: No results for input(s): INR, PROTIME in the last 168 hours. Unresulted Labs (From admission, onward)    None      Antimicrobials/Microbiology: Anti-infectives (From admission, onward)    None          Component Value Date/Time   SDES URINE, CLEAN CATCH 06/27/2022 1814   SPECREQUEST  06/27/2022 1814    NONE Performed at [facility], [address], [place], [place] [address]    CULT 40,000 COLONIES/mL ESCHERICHIA COLI (A) 06/27/2022 1814   REPTSTATUS 06/29/2022 FINAL 06/27/2022 1814      Mennie LAMY, MD [facility] 10/01/2023, 2:52 PM

## 2023-10-01 NOTE — Plan of Care (Signed)

## 2023-10-01 NOTE — Discharge Summary (Signed)
 Physician Discharge Summary  Melissa Shepherd FMW:[id number] DOB: Nov 19, 1970 DOA: 09/28/2023  PCP: Melissa Channing SQUIBB, FNP  Admit date: 09/28/2023 Discharge date: 10/01/2023 Recommendations for Outpatient Follow-up:  Follow up with PCP in 1 weeks-call for appointment Please obtain BMP/CBC in one week  Discharge Dispo: Home Discharge Condition: Stable Code Status:   Code Status: Full Code Diet recommendation:  Diet Order             Diet Heart Room service appropriate? Yes; Fluid consistency: Thin  Diet effective now                    Brief/Interim Summary: 53 y.o. female with medical history significant of breast cancer status post right mastectomy 01/2020, hypertension, hyperlipidemia, IBS, anxiety, depression, tobacco abuse presented to the ED with a chief complaint of left-sided chest pain.  Hemodynamically stable, not hypoxic.  No significant abnormalities on CBC and BMP.  Troponin negative, D-dimer positive.  EKG showing sinus rhythm and no acute ischemic changes. CT angiogram chest showing a small nonocclusive segmental PE within the left upper lobe.Bandlike densities in the lower lobes favored to represent atelectasis. Admitted and anticoagulated.  This patient was subsequently transition to oral Eliquis .  She continued to have chest pain and left side, Managed with pain medication and antispasmodic. Cxr done again and overall she is doing well on RA  Subjective: Seen and examined today Alert awake still having some chest pain on the left side Overnight afebrile BP soft in 90s tolerating her metoprolol   Did had brief SVT in 170s  yesterday and metoprolol  resumed 8/5 stable remains on room air Patient is now requesting for discharge home.  She remains medically stable.    Discharge diagnosis :  PE small nonocclusive segmental LUL Pleuritic left-sided chest pain: Patient with breast cancer currently on exemestane  AND Lupron  injections for ovarian suppression and cigarette  smoker Dopplers negative BL LE's.  Admitted with Lovenox > transitioned to oral Eliquis -continue with loading and maintenance dose subsequent Remains on room air but having chest pain continue pain management no more recurrence of fever.  Obtain chest x-ray, her EKG and troponin has been unremarkable so was BNP. Cxr obtained today and remains grossly normal.  Dehydration Metabolic acidosis: Hypokalemia: Resolved   Transaminitis: elevated bilirubin/AST/ALT/alk phos improving. needs outpatient follow-up Cystic lesion of left kidney: CT angiogram chest showing an incompletely visualized cystic lesion of the left kidney with question solid enhancing area versus prominent cortical tissue.   Per radiologist, patient has history of complex cystic lesion on previously performed MRI from 2024 for which continued imaging follow-up was suggested. She is aware of this and had f/u with Dr in [place]> advised  Mri as OP when able to hold breath form Mri and she is agreeable   Incidental pulmonary nodule: CT showing a stable 7 mm subpleural left lower lobe pulmonary nodule since 2023.  She does have longstanding history of heavy cigarette smoking. CT findings of this and kidney have been discussed with the patient and her husband.  Recommended close outpatient follow-up for repeat imaging/continued monitoring-->carry through to discharge summary   Cigarette smoking Encouraged tobacco cessation, nicotine  patches.    History of breast cancer  Outpatient oncology follow-up.  Mobility: Patient has been mobile and ambulatory.  DVT prophylaxis: eliquis  Code Status:   Code Status: Full Code Family Communication: plan of care discussed with patient at bedside. Patient status is: Remains hospitalized because of severity of illness Level of care: Telemetry Cardiac   Dispo: The  patient is from: home            Anticipated disposition: home today  Objective: Vitals last 24 hrs: Vitals:   09/30/23 2231  10/01/23 0433 10/01/23 0742 10/01/23 0801  BP: (!) 95/58 90/63 98/64  98/64  Pulse: 75 81 82   Resp: (!) 26 11 18    Temp: 100 F (37.8 C) 98.9 F (37.2 C) 99.5 F (37.5 C)   TempSrc: Oral Oral Oral   SpO2: 94% 95%    Weight:      Height:       Physical Examination: General exam: alert awake oriented x 3.  On room air pleasant HEENT:Oral mucosa moist, Ear/Nose WNL grossly Respiratory system: b/l clear breath sounds, diminished bilaterally Cardiovascular system: S1 & S2 +, No JVD. Gastrointestinal system: Abdomen soft,NT,ND, BS+ Nervous System: Alert, awake, moving all extremities,and following commands. Extremities: LE edema neg, distal extremities warm.  Skin: No rashes,no icterus. MSK: Normal muscle bulk,tone, power   Medications reviewed:  Scheduled Meds:  ALPRAZolam   1 mg Oral TID   apixaban   10 mg Oral BID   Followed by   [name] ON 10/07/2023] apixaban   5 mg Oral BID   colestipol   2 g Oral BID   cyclobenzaprine   5 mg Oral QHS   diphenhydrAMINE   25 mg Intravenous Once   metoprolol  tartrate  50 mg Oral BID   nicotine   14 mg Transdermal Daily   prochlorperazine   10 mg Intravenous Once   Continuous Infusions: Diet: Diet Order             Diet Heart Room service appropriate? Yes; Fluid consistency: Thin  Diet effective now                      Consultation: See note.  Discharge Instructions  Discharge Instructions     Discharge instructions   Complete by: As directed    Please call call MD or return to ER for similar or worsening recurring problem that brought you to hospital or if any fever,nausea/vomiting,abdominal pain, uncontrolled pain, chest pain,  shortness of breath or any other alarming symptoms.  Please follow-up your doctor as instructed in a week time and call the office for appointment.  Please avoid alcohol, smoking, or any other illicit substance and maintain healthy habits including taking your regular medications as prescribed.  You were  cared for by a hospitalist during your hospital stay. If you have any questions about your discharge medications or the care you received while you were in the hospital after you are discharged, you can call the unit and ask to speak with the hospitalist on call if the hospitalist that took care of you is not available.  Once you are discharged, your primary care physician will handle any further medical issues. Please note that NO REFILLS for any discharge medications will be authorized once you are discharged, as it is imperative that you return to your primary care physician (or establish a relationship with a primary care physician if you do not have one) for your aftercare needs so that they can reassess your need for medications and monitor your lab values   Increase activity slowly   Complete by: As directed       Allergies as of 10/01/2023       Reactions   Ezetimibe Other (See Comments)   Unknown reaction   Prednisone Anxiety   Psychosis (intolerance)        Medication List  PAUSE taking these medications    hydrochlorothiazide  12.5 MG tablet Wait to take this until your doctor or other care provider tells you to start again. Commonly known as: HYDRODIURIL  TAKE 1 TABLET BY MOUTH DAILY.   losartan 50 MG tablet Wait to take this until your doctor or other care provider tells you to start again. Commonly known as: COZAAR Take 50 mg by mouth daily.       STOP taking these medications    meloxicam  7.5 MG tablet Commonly known as: MOBIC    metoprolol  succinate 100 MG 24 hr tablet Commonly known as: TOPROL -XL       TAKE these medications    acetaminophen  500 MG tablet Commonly known as: TYLENOL  Take 500-1,000 mg by mouth every 6 (six) hours as needed for moderate pain (pain score 4-6).   ALPRAZolam  1 MG tablet Commonly known as: XANAX  Take 0.5-1 mg by mouth 3 (three) times daily as needed for anxiety.   amphetamine-dextroamphetamine 20 MG tablet Commonly  known as: ADDERALL Take 20 mg by mouth 2 (two) times daily as needed (ADHD).   apixaban  5 MG Tabs tablet Commonly known as: ELIQUIS  Take 2 tablets (10 mg total) by mouth 2 (two) times daily for 6 days, THEN 1 tablet (5 mg total) 2 (two) times daily for 24 days. Start taking on: October 01, 2023   ascorbic acid 500 MG tablet Commonly known as: VITAMIN C Take 500 mg by mouth daily.   colestipol  1 g tablet Commonly known as: COLESTID  Take 2 g by mouth 2 (two) times daily.   cyclobenzaprine  5 MG tablet Commonly known as: FLEXERIL  Take 1 tablet (5 mg total) by mouth daily as needed for muscle spasms.   exemestane  25 MG tablet Commonly known as: AROMASIN  TAKE 1 TABLET (25 MG TOTAL) BY MOUTH DAILY.   folic acid  1 MG tablet Commonly known as: FOLVITE  Take 1 mg by mouth at bedtime.   furosemide  40 MG tablet Commonly known as: LASIX  Take 40 mg by mouth daily.   lidocaine  5 % Commonly known as: Lidoderm  Place 1 patch onto the skin daily. Remove & Discard patch within 12 hours or as directed by MD   metoprolol  tartrate 50 MG tablet Commonly known as: LOPRESSOR  Take 1 tablet (50 mg total) by mouth 2 (two) times daily.   mupirocin  ointment 2 % Commonly known as: BACTROBAN  Apply 1 application. topically 3 (three) times daily. As directed for 5 days What changed:  when to take this reasons to take this additional instructions   nitroGLYCERIN 0.4 MG SL tablet Commonly known as: NITROSTAT Place 0.4 mg under the tongue every 5 (five) minutes as needed for chest pain.   potassium chloride  SA 20 MEQ tablet Commonly known as: KLOR-CON  M Take 20 mEq by mouth at bedtime.   rosuvastatin  40 MG tablet Commonly known as: CRESTOR  Take 40 mg by mouth at bedtime.   traMADol  50 MG tablet Commonly known as: Ultram  Take 1 tablet (50 mg total) by mouth every 6 (six) hours as needed for up to 20 doses for severe pain (pain score 7-10) or moderate pain (pain score 4-6).        Allergies   Allergen Reactions   Ezetimibe Other (See Comments)    Unknown reaction    Prednisone Anxiety    Psychosis (intolerance)    The results of significant diagnostics from this hospitalization (including imaging, microbiology, ancillary and laboratory) are listed below for reference.    Microbiology: Recent Results (from the past  240 hours)  MRSA Next Gen by PCR, Nasal     Status: None   Collection Time: 09/28/23  9:25 PM   Specimen: Nasal Mucosa; Nasal Swab  Result Value Ref Range Status   MRSA by PCR Next Gen NOT DETECTED NOT DETECTED Final    Comment: (NOTE) The GeneXpert MRSA Assay (FDA approved for NASAL specimens only), is one component of a comprehensive MRSA colonization surveillance program. It is not intended to diagnose MRSA infection nor to guide or monitor treatment for MRSA infections. Test performance is not FDA approved in patients less than 55 years old. Performed at [facility], [address], [place], [place] [address]     Procedures/Studies: DG Chest Port 1 View Result Date: 10/01/2023 CLINICAL DATA:  Chest pain EXAM: PORTABLE CHEST 1 VIEW COMPARISON:  09/28/2023 FINDINGS: Heart and mediastinal contours within normal limits. Bibasilar opacities likely reflect atelectasis. Low lung volumes. No effusions or acute bony abnormality. IMPRESSION: Low lung volumes with bibasilar opacities, likely atelectasis. Electronically Signed   By: Franky Crease M.D.   On: 10/01/2023 13:20   VAS US  LOWER EXTREMITY VENOUS (DVT) Result Date: 09/30/2023  Lower Venous DVT Study Patient Name:  NUSAYBA CADENAS  Date of Exam:   09/29/2023 Medical Rec #: [id number]       Accession #:    [id number] Date of Birth: 02-Mar-1970        Patient Gender: F Patient Age:   71 years Exam Location:  [facility] Procedure:      VAS US  LOWER EXTREMITY VENOUS (DVT) Referring Phys: EDITHA RATHORE --------------------------------------------------------------------------------  Indications: Pulmonary  embolism.  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 Breast cancer, status post right mastectomy 201, currently on exemestane  and receives Lupron  injections. Current tobacco use. Comparison Study: No prior LEV on file Performing Technologist: Alberta Lis RVS  Examination Guidelines: A complete evaluation includes B-mode imaging, spectral Doppler, color Doppler, and power Doppler as needed of all accessible portions of each vessel. Bilateral testing is considered an integral part of a complete examination. Limited examinations for reoccurring indications may be performed as noted. The reflux portion of the exam is performed with the patient in reverse Trendelenburg.  +---------+---------------+---------+-----------+----------+--------------+ RIGHT    CompressibilityPhasicitySpontaneityPropertiesThrombus Aging +---------+---------------+---------+-----------+----------+--------------+ CFV      Full           Yes      Yes                                 +---------+---------------+---------+-----------+----------+--------------+ SFJ      Full                                                        +---------+---------------+---------+-----------+----------+--------------+ FV Prox  Full                                                        +---------+---------------+---------+-----------+----------+--------------+ FV Mid   Full                                                        +---------+---------------+---------+-----------+----------+--------------+  FV DistalFull                                                        +---------+---------------+---------+-----------+----------+--------------+ PFV      Full                                                        +---------+---------------+---------+-----------+----------+--------------+ POP      Full           Yes      Yes                                  +---------+---------------+---------+-----------+----------+--------------+ PTV      Full                                                        +---------+---------------+---------+-----------+----------+--------------+ PERO     Full                                                        +---------+---------------+---------+-----------+----------+--------------+ Gastroc  Full                                                        +---------+---------------+---------+-----------+----------+--------------+   +---------+---------------+---------+-----------+----------+--------------+ LEFT     CompressibilityPhasicitySpontaneityPropertiesThrombus Aging +---------+---------------+---------+-----------+----------+--------------+ CFV      Full           Yes      Yes                                 +---------+---------------+---------+-----------+----------+--------------+ SFJ      Full                                                        +---------+---------------+---------+-----------+----------+--------------+ FV Prox  Full                                                        +---------+---------------+---------+-----------+----------+--------------+ FV Mid   Full                                                        +---------+---------------+---------+-----------+----------+--------------+  FV DistalFull                                                        +---------+---------------+---------+-----------+----------+--------------+ PFV      Full                                                        +---------+---------------+---------+-----------+----------+--------------+ POP      Full           Yes      Yes                                 +---------+---------------+---------+-----------+----------+--------------+ PTV      Full                                                         +---------+---------------+---------+-----------+----------+--------------+ PERO     Full                                                        +---------+---------------+---------+-----------+----------+--------------+     Summary: BILATERAL: - No evidence of deep vein thrombosis seen in the lower extremities, bilaterally. -No evidence of popliteal cyst, bilaterally.   *See table(s) above for measurements and observations. Electronically signed by Norman Serve on 09/30/2023 at 9:19:54 AM.    Final    CT Angio Chest PE W and/or Wo Contrast Result Date: 09/28/2023 CLINICAL DATA:  Chronic back and knee pain pain with deep breathing EXAM: CT ANGIOGRAPHY CHEST WITH CONTRAST TECHNIQUE: Multidetector CT imaging of the chest was performed using the standard protocol during bolus administration of intravenous contrast. Multiplanar CT image reconstructions and MIPs were obtained to evaluate the vascular anatomy. RADIATION DOSE REDUCTION: This exam was performed according to the departmental dose-optimization program which includes automated exposure control, adjustment of the mA and/or kV according to patient size and/or use of iterative reconstruction technique. CONTRAST:  75mL OMNIPAQUE  IOHEXOL  350 MG/ML SOLN COMPARISON:  Chest x-ray 09/28/2023, CT lung bases 06/19/2021, 05/10/2017, cardiac CT 10/17/2022, MRI 08/01/2022 FINDINGS: Cardiovascular: Satisfactory opacification of the pulmonary arteries to the segmental level. Small non occlusive segmental filling defects within left upper lobe pulmonary vessels, series 7, image 141 and series 7, image 157. Nonaneurysmal aorta. No dissection. Cardiac size is normal. No pericardial effusion Mediastinum/Nodes: Patent trachea. No thyroid  mass. No suspicious lymph nodes. Esophagus within normal limits. Lungs/Pleura: No pleural effusion or pneumothorax. Bandlike densities in the lower lobes favored to represent atelectasis. Subpleural left lower lobe pulmonary nodule  measuring 7 mm on series 6, image 91, stable since April 2023. Upper Abdomen: No acute finding. Incompletely visualized cystic lesion of the left kidney with question solid enhancing area versus prominent cortical tissue on series 5, image 168. Patient has history of complex cystic lesion  on previously performed MRI from 2023 for which imaging follow-up was recommended. Musculoskeletal: Post mastectomy changes on the right. No acute or suspicious bone lesion Review of the MIP images confirms the above findings. IMPRESSION: 1. Small non occlusive segmental pulmonary emboli within the left upper lobe. 2. Bandlike densities in the lower lobes favored to represent atelectasis. Possible trace left pleural effusion. 3. Incompletely visualized cystic lesion of the left kidney with question solid enhancing area versus prominent cortical tissue. Patient has history of complex cystic lesion on previously performed MRI from 2024 for which continued imaging follow-up was suggested. When the patient is clinically stable and able to follow directions and hold their breath (preferably as an outpatient) further evaluation with dedicated abdominal MRI should be considered. 4. Stable 7 mm subpleural left lower lobe pulmonary nodule since 2023. No specific imaging follow-up is recommended. Critical Value/emergent results were called by telephone at the time of interpretation on 09/28/2023 at 6:44 pm to provider Dr. Neysa, Who verbally acknowledged these results. Electronically Signed   By: Luke Bun M.D.   On: 09/28/2023 18:44   DG Chest Portable 1 View Result Date: 09/28/2023 EXAM: 1 VIEW XRAY OF THE CHEST 09/28/2023 01:31:00 PM COMPARISON: 06/28/2023 low lung volumes. CLINICAL HISTORY: Per ED triage notes: Pt c/o chronic back and knee pain worse since Sunday. It hurts when I take a deep breath pt denies any chest pain at this time. I took a xanax  pt slurring words and rambling when describing problems. No n/v. No falls  I've just been laying around and now it's worse FINDINGS: LUNGS AND PLEURA: Low lung volumes. No focal pulmonary opacity. No pulmonary edema. No pleural effusion. No pneumothorax. HEART AND MEDIASTINUM: No acute abnormality of the cardiac and mediastinal silhouettes. BONES AND SOFT TISSUES: No acute osseous abnormality. IMPRESSION: 1. No acute process. Electronically signed by: Waddell Calk MD 09/28/2023 01:37 PM EDT RP Workstation: [id number]    Labs: BNP (last 3 results) Recent Labs    09/28/23 2314  BNP 4.5   Basic Metabolic Panel: Recent Labs  Lab 09/28/23 1229 09/29/23 0938 09/30/23 0304 09/30/23 1702  NA 140 136 138  --   K 4.3 4.3 3.3* 4.5  CL 101 103 104  --   CO2 29 21* 26  --   GLUCOSE 89 137* 115*  --   BUN 14 14 18   --   CREATININE 1.03* 0.99 0.81  --   CALCIUM  9.1 8.6* 8.5*  --   MG  --   --   --  2.0   Liver Function Tests: Recent Labs  Lab 09/29/23 0938 09/30/23 0304  AST 200* 95*  ALT 246* 181*  ALKPHOS 183* 151*  BILITOT 1.7* 1.2  PROT 5.5* 5.6*  ALBUMIN 3.0* 2.9*   No results for input(s): LIPASE, AMYLASE in the last 168 hours. No results for input(s): AMMONIA in the last 168 hours. CBC: Recent Labs  Lab 09/28/23 1229 09/29/23 1037 09/30/23 0304  WBC 5.3 4.4 4.0  HGB 14.9 12.8 12.7  HCT 44.3 38.0 37.2  MCV 89.7 88.8 88.6  PLT 165 150 147*  CBG: Recent Labs  Lab 09/28/23 1328  GLUCAP 99  Urinalysis    Component Value Date/Time   COLORURINE YELLOW (A) 03/31/2021 1945   APPEARANCEUR CLEAR (A) 03/31/2021 1945   LABSPEC <1.005 (L) 03/31/2021 1945   PHURINE 6.0 03/31/2021 1945   GLUCOSEU NEGATIVE 03/31/2021 1945   HGBUR NEGATIVE 03/31/2021 1945   BILIRUBINUR negative 06/27/2022 1805   KETONESUR negative  06/27/2022 1805   KETONESUR NEGATIVE 03/31/2021 1945   PROTEINUR negative 06/27/2022 1805   PROTEINUR NEGATIVE 03/31/2021 1945   UROBILINOGEN 0.2 06/27/2022 1805   NITRITE Negative 06/27/2022 1805   NITRITE NEGATIVE  03/31/2021 1945   LEUKOCYTESUR Small (1+) (A) 06/27/2022 1805   LEUKOCYTESUR NEGATIVE 03/31/2021 1945   Sepsis Labs Recent Labs  Lab 09/28/23 1229 09/29/23 1037 09/30/23 0304  WBC 5.3 4.4 4.0   Microbiology Recent Results (from the past 240 hours)  MRSA Next Gen by PCR, Nasal     Status: None   Collection Time: 09/28/23  9:25 PM   Specimen: Nasal Mucosa; Nasal Swab  Result Value Ref Range Status   MRSA by PCR Next Gen NOT DETECTED NOT DETECTED Final    Comment: (NOTE) The GeneXpert MRSA Assay (FDA approved for NASAL specimens only), is one component of a comprehensive MRSA colonization surveillance program. It is not intended to diagnose MRSA infection nor to guide or monitor treatment for MRSA infections. Test performance is not FDA approved in patients less than 47 years old. Performed at [facility], [address], [place], [place] [address]    Time coordinating discharge: 35  minutes  SIGNED: Mennie LAMY, MD  [facility] 10/01/2023, 3:30 PM  If 7PM-7AM, please contact night-coverage [email or web address]

## 2023-10-05 ENCOUNTER — Emergency Department (HOSPITAL_COMMUNITY)

## 2023-10-05 ENCOUNTER — Other Ambulatory Visit: Payer: Self-pay

## 2023-10-05 ENCOUNTER — Emergency Department (HOSPITAL_COMMUNITY)
Admission: EM | Admit: 2023-10-05 | Discharge: 2023-10-05 | Disposition: A | Discharge status: Home / Self Care | Attending: Emergency Medicine | Admitting: Emergency Medicine

## 2023-10-05 ENCOUNTER — Encounter (HOSPITAL_COMMUNITY): Payer: Self-pay

## 2023-10-05 DIAGNOSIS — R42 Dizziness and giddiness: Secondary | ICD-10-CM | POA: Diagnosis not present

## 2023-10-05 DIAGNOSIS — Z7901 Long term (current) use of anticoagulants: Secondary | ICD-10-CM | POA: Insufficient documentation

## 2023-10-05 DIAGNOSIS — R918 Other nonspecific abnormal finding of lung field: Secondary | ICD-10-CM | POA: Diagnosis not present

## 2023-10-05 DIAGNOSIS — R079 Chest pain, unspecified: Secondary | ICD-10-CM | POA: Insufficient documentation

## 2023-10-05 DIAGNOSIS — R0602 Shortness of breath: Secondary | ICD-10-CM | POA: Insufficient documentation

## 2023-10-05 DIAGNOSIS — K449 Diaphragmatic hernia without obstruction or gangrene: Secondary | ICD-10-CM | POA: Diagnosis not present

## 2023-10-05 DIAGNOSIS — R0989 Other specified symptoms and signs involving the circulatory and respiratory systems: Secondary | ICD-10-CM | POA: Diagnosis not present

## 2023-10-05 DIAGNOSIS — R000 Tachycardia, unspecified: Secondary | ICD-10-CM | POA: Insufficient documentation

## 2023-10-05 DIAGNOSIS — R1902 Left upper quadrant abdominal swelling, mass and lump: Secondary | ICD-10-CM | POA: Diagnosis not present

## 2023-10-05 DIAGNOSIS — R7989 Other specified abnormal findings of blood chemistry: Secondary | ICD-10-CM | POA: Diagnosis not present

## 2023-10-05 DIAGNOSIS — J9811 Atelectasis: Secondary | ICD-10-CM | POA: Diagnosis not present

## 2023-10-05 DIAGNOSIS — Z853 Personal history of malignant neoplasm of breast: Secondary | ICD-10-CM | POA: Diagnosis not present

## 2023-10-05 LAB — I-STAT VENOUS BLOOD GAS, ED
Acid-base deficit: 3 mmol/L — ABNORMAL HIGH (ref 0.0–2.0)
Bicarbonate: 20.6 mmol/L (ref 20.0–28.0)
Calcium, Ion: 1.13 mmol/L — ABNORMAL LOW (ref 1.15–1.40)
HCT: 38 % (ref 36.0–46.0)
Hemoglobin: 12.9 g/dL (ref 12.0–15.0)
O2 Saturation: 40 %
Potassium: 4.4 mmol/L (ref 3.5–5.1)
Sodium: 137 mmol/L (ref 135–145)
TCO2: 22 mmol/L (ref 22–32)
pCO2, Ven: 33.4 mmHg — ABNORMAL LOW (ref 44–60)
pH, Ven: 7.399 (ref 7.25–7.43)
pO2, Ven: 23 mmHg — CL (ref 32–45)

## 2023-10-05 LAB — CBC WITH DIFFERENTIAL/PLATELET
Basophils Absolute: 0.3 K/uL — ABNORMAL HIGH (ref 0.0–0.1)
Basophils Relative: 4 %
Eosinophils Absolute: 0.2 K/uL (ref 0.0–0.5)
Eosinophils Relative: 3 %
HCT: 42.5 % (ref 36.0–46.0)
Hemoglobin: 13.9 g/dL (ref 12.0–15.0)
Lymphocytes Relative: 45 %
Lymphs Abs: 3.2 K/uL (ref 0.7–4.0)
MCH: 30 pg (ref 26.0–34.0)
MCHC: 32.7 g/dL (ref 30.0–36.0)
MCV: 91.8 fL (ref 80.0–100.0)
Monocytes Absolute: 0.4 K/uL (ref 0.1–1.0)
Monocytes Relative: 6 %
Neutro Abs: 2.9 K/uL (ref 1.7–7.7)
Neutrophils Relative %: 42 %
Platelets: 258 K/uL (ref 150–400)
RBC: 4.63 MIL/uL (ref 3.87–5.11)
RDW: 14.5 % (ref 11.5–15.5)
WBC: 7 K/uL (ref 4.0–10.5)
nRBC: 0 % (ref 0.0–0.2)

## 2023-10-05 LAB — COMPREHENSIVE METABOLIC PANEL WITH GFR
ALT: 234 U/L — ABNORMAL HIGH (ref 0–44)
AST: 146 U/L — ABNORMAL HIGH (ref 15–41)
Albumin: 3.3 g/dL — ABNORMAL LOW (ref 3.5–5.0)
Alkaline Phosphatase: 185 U/L — ABNORMAL HIGH (ref 38–126)
Anion gap: 10 (ref 5–15)
BUN: 8 mg/dL (ref 6–20)
CO2: 22 mmol/L (ref 22–32)
Calcium: 9.1 mg/dL (ref 8.9–10.3)
Chloride: 108 mmol/L (ref 98–111)
Creatinine, Ser: 1.02 mg/dL — ABNORMAL HIGH (ref 0.44–1.00)
GFR, Estimated: >60 mL/min (ref >60)
Glucose, Bld: 126 mg/dL — ABNORMAL HIGH (ref 70–99)
Potassium: 4.8 mmol/L (ref 3.5–5.1)
Sodium: 140 mmol/L (ref 135–145)
Total Bilirubin: 0.9 mg/dL (ref 0.0–1.2)
Total Protein: 6.6 g/dL (ref 6.5–8.1)

## 2023-10-05 LAB — RESP PANEL BY RT-PCR (RSV, FLU A&B, COVID)  RVPGX2
Influenza A by PCR: NEGATIVE
Influenza B by PCR: NEGATIVE
Resp Syncytial Virus by PCR: NEGATIVE
SARS Coronavirus 2 by RT PCR: NEGATIVE

## 2023-10-05 LAB — MAGNESIUM: Magnesium: 2 mg/dL (ref 1.7–2.4)

## 2023-10-05 LAB — BRAIN NATRIURETIC PEPTIDE: B Natriuretic Peptide: 3.7 pg/mL (ref 0.0–100.0)

## 2023-10-05 LAB — TROPONIN I (HIGH SENSITIVITY): Troponin I (High Sensitivity): 5 ng/L (ref <18)

## 2023-10-05 MED ORDER — IOHEXOL 350 MG/ML SOLN
75.0000 mL | Freq: Once | INTRAVENOUS | Status: AC | PRN
  Administered 2023-10-05: 75 mL via INTRAVENOUS

## 2023-10-05 NOTE — ED Provider Notes (Signed)
 [place] EMERGENCY DEPARTMENT AT [facility] Provider Note   CSN: [id number] Arrival date & time: 10/05/23  1403     Patient presents with: Shortness of Breath, Chest Pain, and Dizziness   Melissa Shepherd is a 53 y.o. female past medical history significant for breast cancer status post right mastectomy 2021 on monthly Lupron  injections and Aromasin  25 mg daily with recently diagnosed pulmonary embolism on 09/28/2023 discharged on 10/01/2023 on Eliquis  twice daily.  Patient states that since her discharge she has had worsening shortness of breath and last night began experiencing right-sided chest pain.  Patient states that on her initial evaluation and diagnosis of pulmonary embolism she had experienced left-sided chest pain.  Patient states that she has had episodes of hypotension at home with SBP's ranging in the 60s.  Patient denies associated fever, nausea, vomiting, syncope.  She endorses current chest pain and shortness of breath.  She states strict compliance with Eliquis  at home since discharge    Shortness of Breath Associated symptoms: chest pain   Chest Pain Associated symptoms: dizziness and shortness of breath   Dizziness Associated symptoms: chest pain and shortness of breath        Prior to Admission medications   Medication Sig Start Date End Date Taking? Authorizing Provider  acetaminophen  (TYLENOL ) 500 MG tablet Take 500-1,000 mg by mouth every 6 (six) hours as needed for moderate pain (pain score 4-6).    [provider]  ALPRAZolam  (XANAX ) 1 MG tablet Take 0.5-1 mg by mouth 3 (three) times daily as needed for anxiety.    [provider]  amphetamine-dextroamphetamine (ADDERALL) 20 MG tablet Take 20 mg by mouth 2 (two) times daily as needed (ADHD). 12/23/17   [provider]  apixaban  (ELIQUIS ) 5 MG TABS tablet Take 2 tablets (10 mg total) by mouth 2 (two) times daily for 6 days, THEN 1 tablet (5 mg total) 2 (two) times daily for 24  days. 10/01/23 10/31/23  Christobal Guadalajara, MD  ascorbic acid (VITAMIN C) 500 MG tablet Take 500 mg by mouth daily.    [provider]  colestipol  (COLESTID ) 1 g tablet Take 2 g by mouth 2 (two) times daily. 02/01/21   [provider]  cyclobenzaprine  (FLEXERIL ) 5 MG tablet Take 1 tablet (5 mg total) by mouth daily as needed for muscle spasms. 10/01/23 10/31/23  Christobal Guadalajara, MD  exemestane  (AROMASIN ) 25 MG tablet TAKE 1 TABLET (25 MG TOTAL) BY MOUTH DAILY. 06/14/22   Iruku, Praveena, MD  folic acid  (FOLVITE ) 1 MG tablet Take 1 mg by mouth at bedtime. 12/29/20   [provider]  furosemide  (LASIX ) 40 MG tablet Take 40 mg by mouth daily.    [provider]  hydrochlorothiazide  (HYDRODIURIL ) 12.5 MG tablet TAKE 1 TABLET BY MOUTH DAILY. 11/18/16   Maribeth Camellia MATSU, MD  lidocaine  (LIDODERM ) 5 % Place 1 patch onto the skin daily. Remove & Discard patch within 12 hours or as directed by MD 06/28/23   Henderly, Britni A, PA-C  losartan (COZAAR) 50 MG tablet Take 50 mg by mouth daily.    [provider]  metoprolol  tartrate (LOPRESSOR ) 50 MG tablet Take 1 tablet (50 mg total) by mouth 2 (two) times daily. 10/01/23 10/31/23  Christobal Guadalajara, MD  mupirocin  ointment (BACTROBAN ) 2 % Apply 1 application. topically 3 (three) times daily. As directed for 5 days Patient taking differently: Apply 1 application  topically 3 (three) times daily as needed (Dermatitis). 07/13/21   Luiz Channel,  MD  nitroGLYCERIN (NITROSTAT) 0.4 MG SL tablet Place 0.4 mg under the tongue every 5 (five) minutes as needed for chest pain.    [provider]  potassium chloride  SA (KLOR-CON  M) 20 MEQ tablet Take 20 mEq by mouth at bedtime. 03/09/21   [provider]  rosuvastatin  (CRESTOR ) 40 MG tablet Take 40 mg by mouth at bedtime.    [provider]  traMADol  (ULTRAM ) 50 MG tablet Take 1 tablet (50 mg total) by mouth every 6 (six) hours as needed for up to 20 doses for severe pain (pain score  7-10) or moderate pain (pain score 4-6). 10/01/23   Christobal Guadalajara, MD    Allergies: Ezetimibe and Prednisone    Review of Systems  Respiratory:  Positive for shortness of breath.   Cardiovascular:  Positive for chest pain.  Neurological:  Positive for dizziness.    Updated Vital Signs BP 99/70   Pulse 98   Temp 98.1 F (36.7 C)   Resp 18   LMP 05/08/2017 (Approximate) Comment: neg preg test  SpO2 100%   Physical Exam Vitals reviewed.  Constitutional:      General: She is not in acute distress.    Appearance: She is not ill-appearing.  HENT:     Head: Normocephalic and atraumatic.  Neck:     Vascular: No JVD.     Trachea: Trachea normal.  Cardiovascular:     Rate and Rhythm: Regular rhythm. Tachycardia present.     Pulses:          Radial pulses are 2+ on the right side and 2+ on the left side.     Heart sounds: Normal heart sounds. No murmur heard. Pulmonary:     Effort: Pulmonary effort is normal. No tachypnea.     Breath sounds: Normal breath sounds. No wheezing, rhonchi or rales.  Musculoskeletal:     Right lower leg: No edema.     Left lower leg: No edema.     Comments: Spontaneous movement of bilateral upper and lower extremities  Neurological:     Mental Status: She is alert.  Psychiatric:        Behavior: Behavior is cooperative.     (all labs ordered are listed, but only abnormal results are displayed) Labs Reviewed  CBC WITH DIFFERENTIAL/PLATELET - Abnormal; Notable for the following components:      Result Value   Basophils Absolute 0.3 (*)    All other components within normal limits  COMPREHENSIVE METABOLIC PANEL WITH GFR - Abnormal; Notable for the following components:   Glucose, Bld 126 (*)    Creatinine, Ser 1.02 (*)    Albumin 3.3 (*)    AST 146 (*)    ALT 234 (*)    Alkaline Phosphatase 185 (*)    All other components within normal limits  I-STAT VENOUS BLOOD GAS, ED - Abnormal; Notable for the following components:   pCO2, Ven 33.4 (*)     pO2, Ven 23 (*)    Acid-base deficit 3.0 (*)    Calcium , Ion 1.13 (*)    All other components within normal limits  RESP PANEL BY RT-PCR (RSV, FLU A&B, COVID)  RVPGX2  BRAIN NATRIURETIC PEPTIDE  MAGNESIUM  TROPONIN I (HIGH SENSITIVITY)    EKG: EKG Interpretation Date/Time:  Sunday October 05 2023 14:14:54 EDT Ventricular Rate:  124 PR Interval:  122 QRS Duration:  70 QT Interval:  274 QTC Calculation: 393 R Axis:   75  Text Interpretation: Sinus tachycardia ST &  T wave abnormality, consider inferior ischemia Abnormal ECG Confirmed by Yolande Charleston [phone]) on 10/05/2023 3:52:37 PM  Radiology: CT Angio Chest PE W and/or Wo Contrast Result Date: 10/05/2023 CLINICAL DATA:  worsening SOB w arecent PE diagnosis EXAM: CT ANGIOGRAPHY CHEST WITH CONTRAST TECHNIQUE: Multidetector CT imaging of the chest was performed using the standard protocol during bolus administration of intravenous contrast. Multiplanar CT image reconstructions and MIPs were obtained to evaluate the vascular anatomy. RADIATION DOSE REDUCTION: This exam was performed according to the departmental dose-optimization program which includes automated exposure control, adjustment of the mA and/or kV according to patient size and/or use of iterative reconstruction technique. CONTRAST:  75mL OMNIPAQUE  IOHEXOL  350 MG/ML SOLN COMPARISON:  Chest x-ray 10/02/08/25, MRI abdomen 06/20/2021 FINDINGS: Cardiovascular: Satisfactory opacification of the pulmonary arteries to the segmental level. No evidence of pulmonary embolism. Normal heart size. No significant pericardial effusion. The thoracic aorta is normal in caliber. No atherosclerotic plaque of the thoracic aorta. No coronary artery calcifications. Mediastinum/Nodes: No enlarged mediastinal, hilar, or axillary lymph nodes. Thyroid  gland, trachea, and esophagus demonstrate no significant findings. Small hiatal hernia. Lungs/Pleura: Bibasilar patchy airspace opacities. No focal consolidation.  No pulmonary nodule. No pulmonary mass. No pleural effusion. No pneumothorax. Upper Abdomen: Partially visualized 2.2 cm enhancing left renal mass. Status post cholecystectomy. Musculoskeletal: No chest wall abnormality.  Right mastectomy. No suspicious lytic or blastic osseous lesions. No acute displaced fracture. Review of the MIP images confirms the above findings. IMPRESSION: 1. No pulmonary embolus. 2. Bibasilar patchy airspace opacities with limited evaluation on this study due to timing of contrast. Finding may represent a combination of atelectasis and infection/inflammation. 3. A partially visualized at least 2.2 cm enhancing left mass is consistent with previously identified Bosniak 3 lesion that is concerning for malignancy. When the patient is clinically stable and able to follow directions and hold their breath (preferably as an outpatient) further evaluation with dedicated MRI renal protocol should be considered. Electronically Signed   By: Morgane  Naveau M.D.   On: 10/05/2023 17:27   DG Chest 1 View Result Date: 10/05/2023 CLINICAL DATA:  Shortness of breath, chest pain. EXAM: CHEST  1 VIEW COMPARISON:  10/01/2023 and CT chest 09/28/2023. FINDINGS: Trachea is midline. Heart is at the upper limits of normal in size, stable. Somewhat low lung volumes with bibasilar streaky atelectasis. Possible trace left pleural effusion. Surgical clips in the lateral right breast. IMPRESSION: Somewhat low lung volumes with bibasilar streaky atelectasis. Possible trace left pleural effusion. Electronically Signed   By: Newell Eke M.D.   On: 10/05/2023 15:09     Procedures   Medications Ordered in the ED  iohexol  (OMNIPAQUE ) 350 MG/ML injection 75 mL (75 mLs Intravenous Contrast Given 10/05/23 1659)    Clinical Course as of 10/05/23 2024  Sun Oct 05, 2023  1535 Chest x-ray reviewed by me: Trachea is midline, no pneumothorax.  No evidence of obvious focal consolidation.  No obvious pleural effusion  [AG]  1536 EKG reviewed by me: Sinus tachycardia with normal axis and intervals.  ST depressions in inferior leads similar to prior EKG performed on 8/4 [AG]    Clinical Course User Index [AG] Nada Chroman, DO                                 Medical Decision Making Amount and/or Complexity of Data Reviewed Radiology: ordered.  Risk Prescription drug management.   On initial evaluation patient is  tachycardic however normotensive and saturating well on room air.  Based upon patient's history and recent diagnosis of pulmonary embolism, will obtain laboratory studies as well as CTA chest to assess for pulmonary embolism.  Differential diagnosis include pneumonia, pneumothorax, increased thromboembolic burden, ACS/MI.  Laboratory studies without evidence of infection, anemia, electrolyte abnormalities or elevated troponin.  BNP within normal limits.  CTA PE study without worsening burden of pulmonary embolism.  There is evidence of mass concerning for underlying malignancy which patient has been followed in the outpatient setting for.  At this time do not believe patient's presentation is secondary to failed outpatient pulmonary embolism anticoagulation, pneumothorax or pneumonia as these are not suggestive on CT findings.  Patient does have evidence of inflammation/infection on CT imaging however she is afebrile saturating well on room air and does not have a leukocytosis and is without productive cough therefore do not believe antibiotics are necessary at this time.  EKG without evidence of acute ischemic findings and troponin within normal limits therefore do not believe ACS/MI is etiology of patient's presentation.  Shared decision-making held with the patient and she will follow-up in outpatient setting with primary care.  She was given strict return precautions and agreed with and understood plan of care at time of discharge.     Final diagnoses:  Shortness of breath    ED Discharge Orders      None       Lavanda Bolster DO Emergency Medicine PGY2    Bolster Lavanda, DO 10/05/23 2025    Yolande Lamar BROCKS, MD 10/09/23 1759

## 2023-10-05 NOTE — Discharge Instructions (Signed)
 Thank you for allowing us  to care for you today.  Please be sure to follow-up with your primary care provider and continue taking Eliquis  as you have been prescribed.  Please return to the emergency department if you have worsening shortness of breath, chest pain, passout or believe you need emergent medical care.  We hope you feel better soon

## 2023-10-05 NOTE — ED Triage Notes (Signed)
 Pt c.o continued sob, chest pain and dizziness, pt was discharged from hospital admission Wednesday after diagnosed with a PE.

## 2023-10-06 DIAGNOSIS — I2699 Other pulmonary embolism without acute cor pulmonale: Secondary | ICD-10-CM | POA: Diagnosis not present

## 2023-10-22 DIAGNOSIS — F17210 Nicotine dependence, cigarettes, uncomplicated: Secondary | ICD-10-CM | POA: Diagnosis not present

## 2023-10-22 DIAGNOSIS — I10 Essential (primary) hypertension: Secondary | ICD-10-CM | POA: Diagnosis not present

## 2023-10-24 DIAGNOSIS — N281 Cyst of kidney, acquired: Secondary | ICD-10-CM | POA: Diagnosis not present

## 2023-10-24 DIAGNOSIS — Z170 Estrogen receptor positive status [ER+]: Secondary | ICD-10-CM | POA: Diagnosis not present

## 2023-10-24 DIAGNOSIS — C50111 Malignant neoplasm of central portion of right female breast: Secondary | ICD-10-CM | POA: Diagnosis not present

## 2023-10-24 DIAGNOSIS — N2889 Other specified disorders of kidney and ureter: Secondary | ICD-10-CM | POA: Diagnosis not present

## 2023-10-28 DIAGNOSIS — I10 Essential (primary) hypertension: Secondary | ICD-10-CM | POA: Diagnosis not present

## 2023-10-29 DIAGNOSIS — F410 Panic disorder [episodic paroxysmal anxiety] without agoraphobia: Secondary | ICD-10-CM | POA: Diagnosis not present

## 2023-10-29 DIAGNOSIS — F900 Attention-deficit hyperactivity disorder, predominantly inattentive type: Secondary | ICD-10-CM | POA: Diagnosis not present

## 2023-10-29 DIAGNOSIS — F411 Generalized anxiety disorder: Secondary | ICD-10-CM | POA: Diagnosis not present

## 2023-11-04 ENCOUNTER — Ambulatory Visit: Admission: EM | Admit: 2023-11-04 | Discharge: 2023-11-04 | Disposition: A | Discharge status: Home / Self Care

## 2023-11-04 DIAGNOSIS — F411 Generalized anxiety disorder: Secondary | ICD-10-CM

## 2023-11-04 DIAGNOSIS — R42 Dizziness and giddiness: Secondary | ICD-10-CM | POA: Diagnosis not present

## 2023-11-04 DIAGNOSIS — R030 Elevated blood-pressure reading, without diagnosis of hypertension: Secondary | ICD-10-CM | POA: Diagnosis not present

## 2023-11-04 DIAGNOSIS — I10 Essential (primary) hypertension: Secondary | ICD-10-CM | POA: Diagnosis not present

## 2023-11-04 NOTE — ED Provider Notes (Incomplete)
 [name]    CSN: [id number] Arrival date & time: 11/04/23  1832      History   Chief Complaint Chief Complaint  Patient presents with  . Dizziness  . Hypertension    HPI Melissa Shepherd is a 53 y.o. female.   She has felt dizzy and with facial flushing since finding out she has a PE on September 29, 2023 They changed her BP medications when she was in the hospital due to hypotension Metoprolol - took at 1am today Losartan supposed to take one per day, took another one yesterday 12 hours after initial dose  Did have breast cancer right mastectomy- January 27, 2020 Oncology visits have been good since then  No worsening chest pain or shortness of breath in the last few days CT angio was negative at ER recently  Saw PCP last week who added the losartan back and told her to go back 3 weeks  Chest is sore and painful for last couple of days, she's questioning if something new or something due to new blood clot  Over 200 beginning of last week, Bps 190s at home a little bit  Lots of anxiety  Copes with movies and TV Checks BP at home and gets anxious  Laying on couch trying to not stress out  She's worried she'll have a stroke due to high BP     Dizziness Hypertension    Past Medical History:  Diagnosis Date  . Anginal pain (HCC)   . Anxiety    panic attacks  . Cancer (HCC)   . Chicken pox   . Cholelithiasis with acute on chronic cholecystitis without biliary obstruction 11/26/2016  . Depression   . Diarrhea   . Dyspnea    with chest pain  . Fatty liver   . Frequent headaches   . Hypertension   . IBS (irritable bowel syndrome)   . Multiple thyroid  nodules   . Seizures (HCC)    pregenancy- toxemia- 1991    Patient Active Problem List   Diagnosis Date Noted  . Acute pulmonary embolism (HCC) 09/28/2023  . History of breast cancer 09/28/2023  . Incidental pulmonary nodule 09/28/2023  . History of cigarette smoking 09/28/2023  .  Degenerative spondylolisthesis 08/05/2023  . Dense breast tissue on mammogram 12/25/2022  . 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 Sacroiliac joint pain 05/01/2022  . Lumbar radiculopathy 04/30/2022  . Osteoarthritis of right knee 02/20/2022  . Low back pain 12/13/2021  . Chronic radicular lumbar pain 10/11/2021  . Lumbar facet arthropathy 10/11/2021  . Lumbar degenerative disc disease 10/11/2021  . Chronic pain syndrome 10/11/2021  . Lymphedema 06/27/2021  . Cellulitis 06/20/2021  . Open abdominal wall wound 06/19/2021  . Hypokalemia 06/19/2021  . Lesion of left native kidney 06/19/2021  . Abdominal wall cellulitis 06/18/2021  . Pain and swelling of lower leg 04/25/2021  . Cellulitis and abscess of left lower extremity 03/31/2021  . Cellulitis and abscess of left lower extremity 03/31/2021  . Osteoarthritis of left knee 05/12/2020  . BRCA negative 01/19/2020  . History of genetic counseling 01/07/2020  . Malignant neoplasm of central portion of right breast in female, estrogen receptor positive (HCC) 12/24/2019  . Uterine fibroid 01/13/2018  . Osteoarthritis of knee 03/19/2017  . Cholelithiasis with acute on chronic cholecystitis without biliary obstruction 11/26/2016  . Dysmenorrhea 09/24/2016  . Stable angina pectoris (HCC) 06/05/2016  . Mixed hyperlipidemia 08/18/2015  . Vitamin D  deficiency 08/18/2015  . IBS (irritable bowel syndrome) 05/01/2015  .  Lumbago 10/06/2014  . Bilateral thoracic back pain 10/06/2014  . Chest pain 07/09/2014  . Leg swelling 07/09/2014  . Essential hypertension 06/11/2014  . Depression 06/11/2014  . Tobacco use disorder 06/11/2014  . Abnormal uterine bleeding (AUB) 06/29/2013  . History of abnormal cervical Pap smear 05/11/2013  . H/O abnormal mammogram 05/11/2013  . Panic attacks 05/10/2013  . Anxiety 04/24/2011  . Paresthesia 12/11/2010    Past Surgical History:  Procedure Laterality Date  . CESAREAN SECTION  1991. 2003.   2  .  CHOLECYSTECTOMY N/A 11/26/2016   Procedure: LAPAROSCOPIC CHOLECYSTECTOMY WITH INTRAOPERATIVE CHOLANGIOGRAM;  Surgeon: Gail Favorite, MD;  Location: [facility] OR;  Service: General;  Laterality: N/A;  . MASTECTOMY Right   . WISDOM TOOTH EXTRACTION      OB History     Gravida  5   Para  4   Term  3   Preterm  1   AB  1   Living  2      SAB      IAB      Ectopic      Multiple      Live Births  2            Home Medications    Prior to Admission medications   Medication Sig Start Date End Date Taking? Authorizing Provider  ALPRAZolam  (XANAX ) 1 MG tablet Take 0.5-1 mg by mouth 3 (three) times daily as needed for anxiety.   Yes [provider]  apixaban  (ELIQUIS ) 5 MG TABS tablet Take 5 mg by mouth 2 (two) times daily. 10/24/23  Yes [provider]  escitalopram (LEXAPRO) 10 MG tablet Take 10 mg by mouth daily.   Yes [provider]  exemestane  (AROMASIN ) 25 MG tablet TAKE 1 TABLET (25 MG TOTAL) BY MOUTH DAILY. 06/14/22  Yes Iruku, Praveena, MD  losartan (COZAAR) 50 MG tablet Take 50 mg by mouth daily.   Yes [provider]  metoprolol  tartrate (LOPRESSOR ) 50 MG tablet Take 1 tablet (50 mg total) by mouth 2 (two) times daily. 10/01/23 11/04/23 Yes Christobal Guadalajara, MD  mupirocin  ointment (BACTROBAN ) 2 % Apply 1 application. topically 3 (three) times daily. As directed for 5 days Patient taking differently: Apply 1 application  topically 3 (three) times daily as needed (Dermatitis). 07/13/21  Yes Luiz Channel, MD  potassium chloride  SA (KLOR-CON  M) 20 MEQ tablet Take 20 mEq by mouth at bedtime. 03/09/21  Yes [provider]  acetaminophen  (TYLENOL ) 500 MG tablet Take 500-1,000 mg by mouth every 6 (six) hours as needed for moderate pain (pain score 4-6).    [provider]  amphetamine-dextroamphetamine (ADDERALL) 20 MG tablet Take 20 mg by mouth 2 (two) times daily as needed (ADHD). 12/23/17   [provider]  apixaban   (ELIQUIS ) 5 MG TABS tablet Take 2 tablets (10 mg total) by mouth 2 (two) times daily for 6 days, THEN 1 tablet (5 mg total) 2 (two) times daily for 24 days. 10/01/23 10/31/23  Christobal Guadalajara, MD  ascorbic acid (VITAMIN C) 500 MG tablet Take 500 mg by mouth daily.    [provider]  colestipol  (COLESTID ) 1 g tablet Take 2 g by mouth 2 (two) times daily. 02/01/21   [provider]  folic acid  (FOLVITE ) 1 MG tablet Take 1 mg by mouth at bedtime. 12/29/20   [provider]  furosemide  (LASIX ) 40 MG tablet Take 40 mg by mouth daily.    [provider]  hydrochlorothiazide  (HYDRODIURIL ) 12.5  MG tablet TAKE 1 TABLET BY MOUTH DAILY. 11/18/16   Maribeth Camellia MATSU, MD  lidocaine  (LIDODERM ) 5 % Place 1 patch onto the skin daily. Remove & Discard patch within 12 hours or as directed by MD 06/28/23   Henderly, Britni A, PA-C  nitroGLYCERIN (NITROSTAT) 0.4 MG SL tablet Place 0.4 mg under the tongue every 5 (five) minutes as needed for chest pain.    [provider]  rosuvastatin  (CRESTOR ) 40 MG tablet Take 40 mg by mouth at bedtime.    [provider]  traMADol  (ULTRAM ) 50 MG tablet Take 1 tablet (50 mg total) by mouth every 6 (six) hours as needed for up to 20 doses for severe pain (pain score 7-10) or moderate pain (pain score 4-6). 10/01/23   Christobal Guadalajara, MD    Family History Family History  Problem Relation Age of Onset  . Hypertension Mother   . Cancer Sister        cervical  . Breast cancer Paternal Aunt   . Breast cancer Maternal Grandmother   . Cancer Maternal Grandmother        Breast    Social History Social History   Tobacco Use  . Smoking status: Every Day    Current packs/day: 0.25    Average packs/day: 0.3 packs/day for 31.0 years (7.8 ttl pk-yrs)    Types: Cigarettes  . Smokeless tobacco: Never  Vaping Use  . Vaping status: Never Used  Substance Use Topics  . Alcohol use: Not Currently    Alcohol/week: 2.0 standard drinks of alcohol     Types: 2 Cans of beer per week  . Drug use: No     Allergies   Ezetimibe and Prednisone   Review of Systems Review of Systems  Neurological:  Positive for dizziness.     Physical Exam Triage Vital Signs ED Triage Vitals  Encounter Vitals Group     BP 11/04/23 1905 (!) 151/93     Girls Systolic BP Percentile --      Girls Diastolic BP Percentile --      Boys Systolic BP Percentile --      Boys Diastolic BP Percentile --      Pulse Rate 11/04/23 1905 (!) 50     Resp 11/04/23 1905 18     Temp 11/04/23 1905 98.5 F (36.9 C)     Temp Source 11/04/23 1905 Oral     SpO2 11/04/23 1905 97 %     Weight 11/04/23 1859 184 lb 8.4 oz (83.7 kg)     Height 11/04/23 1859 5' 7 (1.702 m)     Head Circumference --      Peak Flow --      Pain Score 11/04/23 1852 2     Pain Loc --      Pain Education --      Exclude from Growth Chart --    No data found.  Updated Vital Signs BP (!) 151/93 (BP Location: Left Arm)   Pulse (!) 50   Temp 98.5 F (36.9 C) (Oral)   Resp 18   Ht 5' 7 (1.702 m)   Wt 184 lb 8.4 oz (83.7 kg)   LMP 05/08/2017 (Approximate) Comment: neg preg test  SpO2 97%   BMI 28.90 kg/m   Visual Acuity Right Eye Distance:   Left Eye Distance:   Bilateral Distance:    Right Eye Near:   Left Eye Near:    Bilateral Near:     Physical Exam  UC Treatments / Results  Labs (all labs ordered are listed, but only abnormal results are displayed) Labs Reviewed - No data to display  EKG   Radiology No results found.  Procedures Procedures (including critical care time)  Medications Ordered in UC Medications - No data to display  Initial Impression / Assessment and Plan / UC Course  I have reviewed the triage vital signs and the nursing notes.  Pertinent labs & imaging results that were available during my care of the patient were reviewed by me and considered in my medical decision making (see chart for details).     *** Final Clinical  Impressions(s) / UC Diagnoses   Final diagnoses:  None   Discharge Instructions   None    ED Prescriptions   None    PDMP not reviewed this encounter.

## 2023-11-04 NOTE — ED Triage Notes (Signed)
 Patient reports starting yesterday my BP is more out of control with HR concerns. I have had a ha since being in the hospital. I have also noticed chest soreness pain at times, I stay dizzy and feel out of sorts and not my normal. I couldn't get into my PCP immediately so I am here.  Of Note: recent history of blood clots, I had a lot of medicine changes when discharged, I have seen my PCP (2x) recently since D/C due to increased HR and BP concerns.

## 2023-11-04 NOTE — Discharge Instructions (Addendum)
 Take BP once daily. Take again if you're symptomatic (dizzy, headache, vision changes, chest pain, etc).   Do not take metoprolol  if heart rate is less than 50.  Losartan once daily as prescribed.   Try to get in with your PCP in the next 2-3 days.   If you develop any new or worsening symptoms or if your symptoms do not start to improve, please return here or follow-up with your primary care provider. If your symptoms are severe, please go to the emergency room.

## 2023-11-11 ENCOUNTER — Emergency Department (HOSPITAL_COMMUNITY)
Admission: EM | Admit: 2023-11-11 | Discharge: 2023-11-12 | Discharge status: Against Medical Advice | Attending: Emergency Medicine | Admitting: Emergency Medicine

## 2023-11-11 ENCOUNTER — Encounter (HOSPITAL_COMMUNITY): Payer: Self-pay

## 2023-11-11 ENCOUNTER — Emergency Department (HOSPITAL_COMMUNITY)

## 2023-11-11 ENCOUNTER — Other Ambulatory Visit: Payer: Self-pay

## 2023-11-11 DIAGNOSIS — Z5321 Procedure and treatment not carried out due to patient leaving prior to being seen by health care provider: Secondary | ICD-10-CM | POA: Diagnosis not present

## 2023-11-11 DIAGNOSIS — H538 Other visual disturbances: Secondary | ICD-10-CM | POA: Diagnosis not present

## 2023-11-11 DIAGNOSIS — Z7901 Long term (current) use of anticoagulants: Secondary | ICD-10-CM | POA: Diagnosis not present

## 2023-11-11 DIAGNOSIS — R519 Headache, unspecified: Secondary | ICD-10-CM | POA: Diagnosis not present

## 2023-11-11 HISTORY — DX: Other pulmonary embolism without acute cor pulmonale: I26.99

## 2023-11-11 LAB — COMPREHENSIVE METABOLIC PANEL WITH GFR
ALT: 32 U/L (ref 0–44)
AST: 25 U/L (ref 15–41)
Albumin: 4 g/dL (ref 3.5–5.0)
Alkaline Phosphatase: 55 U/L (ref 38–126)
Anion gap: 11 (ref 5–15)
BUN: 7 mg/dL (ref 6–20)
CO2: 24 mmol/L (ref 22–32)
Calcium: 9.4 mg/dL (ref 8.9–10.3)
Chloride: 105 mmol/L (ref 98–111)
Creatinine, Ser: 0.9 mg/dL (ref 0.44–1.00)
GFR, Estimated: >60 mL/min (ref >60)
Glucose, Bld: 88 mg/dL (ref 70–99)
Potassium: 4.2 mmol/L (ref 3.5–5.1)
Sodium: 140 mmol/L (ref 135–145)
Total Bilirubin: 0.8 mg/dL (ref 0.0–1.2)
Total Protein: 6.6 g/dL (ref 6.5–8.1)

## 2023-11-11 LAB — CBC WITH DIFFERENTIAL/PLATELET
Abs Immature Granulocytes: 0.02 K/uL (ref 0.00–0.07)
Basophils Absolute: 0 K/uL (ref 0.0–0.1)
Basophils Relative: 1 %
Eosinophils Absolute: 0.2 K/uL (ref 0.0–0.5)
Eosinophils Relative: 3 %
HCT: 42 % (ref 36.0–46.0)
Hemoglobin: 14.1 g/dL (ref 12.0–15.0)
Immature Granulocytes: 0 %
Lymphocytes Relative: 44 %
Lymphs Abs: 2.7 K/uL (ref 0.7–4.0)
MCH: 30.1 pg (ref 26.0–34.0)
MCHC: 33.6 g/dL (ref 30.0–36.0)
MCV: 89.6 fL (ref 80.0–100.0)
Monocytes Absolute: 0.3 K/uL (ref 0.1–1.0)
Monocytes Relative: 5 %
Neutro Abs: 2.9 K/uL (ref 1.7–7.7)
Neutrophils Relative %: 47 %
Platelets: 200 K/uL (ref 150–400)
RBC: 4.69 MIL/uL (ref 3.87–5.11)
RDW: 13.2 % (ref 11.5–15.5)
WBC: 6.1 K/uL (ref 4.0–10.5)
nRBC: 0 % (ref 0.0–0.2)

## 2023-11-11 LAB — PREGNANCY, URINE: Preg Test, Ur: NEGATIVE

## 2023-11-11 NOTE — ED Triage Notes (Signed)
 Pt is here for evaluation of severe headache, not associated with any trauma.  Pt notes that she is on eliquis 

## 2023-11-11 NOTE — ED Provider Triage Note (Signed)
 Emergency Medicine Provider Triage Evaluation Note  JERMEKA SCHLOTTERBECK , a 53 y.o. female  was evaluated in triage.  Pt complains of HA for one month, constant, worse today. Blurred vision. No trauma. New medication - Eliquis  since these started. No focal deficit.   Review of Systems  Positive: HA Negative: CP  Physical Exam  BP (!) 170/111 (BP Location: Left Arm)   Pulse 76   Temp 97.8 F (36.6 C)   Resp 17   Ht 5' 7 (1.702 m)   Wt 83.5 kg   LMP 05/08/2017 (Approximate) Comment: neg preg test  SpO2 99%   BMI 28.82 kg/m  Gen:   Awake, no distress   Resp:  Normal effort  MSK:   Moves extremities without difficulty  Other:    Medical Decision Making  Medically screening exam initiated at 1:48 PM.  Appropriate orders placed.  DEANGELA RANDLEMAN was informed that the remainder of the evaluation will be completed by another provider, this initial triage assessment does not replace that evaluation, and the importance of remaining in the ED until their evaluation is complete.     Shermon Warren SAILOR, PA-C 11/11/23 1350

## 2023-11-12 NOTE — ED Notes (Signed)
 We called patient 3x, and received no answer.

## 2023-11-17 DIAGNOSIS — R0602 Shortness of breath: Secondary | ICD-10-CM | POA: Diagnosis not present

## 2023-11-17 DIAGNOSIS — E860 Dehydration: Secondary | ICD-10-CM | POA: Diagnosis not present

## 2023-11-17 DIAGNOSIS — I890 Lymphedema, not elsewhere classified: Secondary | ICD-10-CM | POA: Diagnosis not present

## 2023-11-17 DIAGNOSIS — R600 Localized edema: Secondary | ICD-10-CM | POA: Diagnosis not present

## 2023-11-17 DIAGNOSIS — F410 Panic disorder [episodic paroxysmal anxiety] without agoraphobia: Secondary | ICD-10-CM | POA: Diagnosis not present

## 2023-11-17 DIAGNOSIS — I2089 Other forms of angina pectoris: Secondary | ICD-10-CM | POA: Diagnosis not present

## 2023-11-17 DIAGNOSIS — R079 Chest pain, unspecified: Secondary | ICD-10-CM | POA: Diagnosis not present

## 2023-11-17 DIAGNOSIS — E782 Mixed hyperlipidemia: Secondary | ICD-10-CM | POA: Diagnosis not present

## 2023-11-17 DIAGNOSIS — F17200 Nicotine dependence, unspecified, uncomplicated: Secondary | ICD-10-CM | POA: Diagnosis not present

## 2023-11-17 DIAGNOSIS — I10 Essential (primary) hypertension: Secondary | ICD-10-CM | POA: Diagnosis not present

## 2023-12-08 DIAGNOSIS — M7989 Other specified soft tissue disorders: Secondary | ICD-10-CM | POA: Diagnosis not present

## 2023-12-08 DIAGNOSIS — L03116 Cellulitis of left lower limb: Secondary | ICD-10-CM | POA: Diagnosis not present

## 2023-12-08 DIAGNOSIS — M79605 Pain in left leg: Secondary | ICD-10-CM | POA: Diagnosis not present

## 2023-12-19 DIAGNOSIS — C50111 Malignant neoplasm of central portion of right female breast: Secondary | ICD-10-CM | POA: Diagnosis not present

## 2023-12-19 DIAGNOSIS — Z170 Estrogen receptor positive status [ER+]: Secondary | ICD-10-CM | POA: Diagnosis not present

## 2024-01-05 ENCOUNTER — Other Ambulatory Visit: Payer: Self-pay

## 2024-01-05 ENCOUNTER — Encounter (HOSPITAL_BASED_OUTPATIENT_CLINIC_OR_DEPARTMENT_OTHER): Payer: Self-pay

## 2024-01-05 ENCOUNTER — Emergency Department (HOSPITAL_BASED_OUTPATIENT_CLINIC_OR_DEPARTMENT_OTHER)
Admission: EM | Admit: 2024-01-05 | Discharge: 2024-01-05 | Disposition: A | Discharge status: Home / Self Care | Attending: Emergency Medicine | Admitting: Emergency Medicine

## 2024-01-05 DIAGNOSIS — X501XXA Overexertion from prolonged static or awkward postures, initial encounter: Secondary | ICD-10-CM | POA: Insufficient documentation

## 2024-01-05 DIAGNOSIS — M5459 Other low back pain: Secondary | ICD-10-CM | POA: Diagnosis not present

## 2024-01-05 DIAGNOSIS — Z7901 Long term (current) use of anticoagulants: Secondary | ICD-10-CM | POA: Diagnosis not present

## 2024-01-05 DIAGNOSIS — M545 Low back pain, unspecified: Secondary | ICD-10-CM | POA: Diagnosis not present

## 2024-01-05 DIAGNOSIS — M549 Dorsalgia, unspecified: Secondary | ICD-10-CM | POA: Diagnosis present

## 2024-01-05 MED ORDER — KETOROLAC TROMETHAMINE 60 MG/2ML IM SOLN
60.0000 mg | Freq: Once | INTRAMUSCULAR | Status: AC
  Administered 2024-01-05: 60 mg via INTRAMUSCULAR
  Filled 2024-01-05: qty 2

## 2024-01-05 MED ORDER — OXYCODONE-ACETAMINOPHEN 5-325 MG PO TABS: 1.0000 | ORAL_TABLET | Freq: Four times a day (QID) | ORAL | 0 refills | Status: AC | PRN

## 2024-01-05 MED ORDER — NAPROXEN 500 MG PO TABS: 500.0000 mg | ORAL_TABLET | Freq: Two times a day (BID) | ORAL | 0 refills | Status: AC

## 2024-01-05 MED ORDER — OXYCODONE-ACETAMINOPHEN 5-325 MG PO TABS
2.0000 | ORAL_TABLET | Freq: Once | ORAL | Status: AC
  Administered 2024-01-05: 2 via ORAL
  Filled 2024-01-05: qty 2

## 2024-01-05 NOTE — ED Provider Notes (Addendum)
 [facility] Provider Note   CSN: [id number] Arrival date & time: 01/05/24  0118     Patient presents with: Back Pain   Melissa Shepherd is a 53 y.o. female.   Patient is a 53 year old female presenting with complaints of back pain.  She reports reaching to pick up her 81-year-old grandchild when she felt a pull in her low back.  Symptoms began earlier this evening.  She does have a history of back problems, but this injury is new.  She denies any radiation into her legs.  No bowel or bladder complaints.  Pain worse with movement and changing position.  No alleviating factors.       Prior to Admission medications   Medication Sig Start Date End Date Taking? Authorizing Provider  acetaminophen  (TYLENOL ) 500 MG tablet Take 500-1,000 mg by mouth every 6 (six) hours as needed for moderate pain (pain score 4-6).    [provider]  ALPRAZolam  (XANAX ) 1 MG tablet Take 0.5-1 mg by mouth 3 (three) times daily as needed for anxiety.    [provider]  amphetamine-dextroamphetamine (ADDERALL) 20 MG tablet Take 20 mg by mouth 2 (two) times daily as needed (ADHD). 12/23/17   [provider]  apixaban  (ELIQUIS ) 5 MG TABS tablet Take 2 tablets (10 mg total) by mouth 2 (two) times daily for 6 days, THEN 1 tablet (5 mg total) 2 (two) times daily for 24 days. 10/01/23 10/31/23  Christobal Guadalajara, MD  apixaban  (ELIQUIS ) 5 MG TABS tablet Take 5 mg by mouth 2 (two) times daily. 10/24/23   [provider]  ascorbic acid (VITAMIN C) 500 MG tablet Take 500 mg by mouth daily.    [provider]  colestipol  (COLESTID ) 1 g tablet Take 2 g by mouth 2 (two) times daily. 02/01/21   [provider]  escitalopram (LEXAPRO) 10 MG tablet Take 10 mg by mouth daily.    [provider]  exemestane  (AROMASIN ) 25 MG tablet TAKE 1 TABLET (25 MG TOTAL) BY MOUTH DAILY. 06/14/22   Iruku, Praveena, MD  folic acid  (FOLVITE ) 1 MG tablet  Take 1 mg by mouth at bedtime. 12/29/20   [provider]  furosemide  (LASIX ) 40 MG tablet Take 40 mg by mouth daily.    [provider]  hydrochlorothiazide  (HYDRODIURIL ) 12.5 MG tablet TAKE 1 TABLET BY MOUTH DAILY. 11/18/16   Maribeth Camellia MATSU, MD  lidocaine  (LIDODERM ) 5 % Place 1 patch onto the skin daily. Remove & Discard patch within 12 hours or as directed by MD 06/28/23   Henderly, Britni A, PA-C  losartan (COZAAR) 50 MG tablet Take 50 mg by mouth daily.    [provider]  metoprolol  tartrate (LOPRESSOR ) 50 MG tablet Take 1 tablet (50 mg total) by mouth 2 (two) times daily. 10/01/23 11/04/23  Christobal Guadalajara, MD  mupirocin  ointment (BACTROBAN ) 2 % Apply 1 application. topically 3 (three) times daily. As directed for 5 days Patient taking differently: Apply 1 application  topically 3 (three) times daily as needed (Dermatitis). 07/13/21   Luiz Channel, MD  nitroGLYCERIN (NITROSTAT) 0.4 MG SL tablet Place 0.4 mg under the tongue every 5 (five) minutes as needed for chest pain.    [provider]  potassium chloride  SA (KLOR-CON  M) 20 MEQ tablet Take 20 mEq by mouth at bedtime. 03/09/21   [provider]  rosuvastatin  (CRESTOR ) 40 MG tablet Take 40 mg by mouth at bedtime.    [provider]  traMADol  (ULTRAM ) 50 MG tablet Take 1 tablet (50 mg total) by mouth every 6 (six) hours as needed for up to 20 doses for severe pain (pain score 7-10) or moderate pain (pain score 4-6). 10/01/23   Christobal Guadalajara, MD    Allergies: Ezetimibe and Prednisone    Review of Systems  All other systems reviewed and are negative.   Updated Vital Signs BP 127/86 (BP Location: Left Arm)   Pulse 65   Temp 97.6 F (36.4 C) (Oral)   Resp 18   Ht 5' 7 (1.702 m)   Wt 80.7 kg   LMP 05/08/2017 (Approximate) Comment: neg preg test  SpO2 100%   BMI 27.88 kg/m   Physical Exam Vitals and nursing note reviewed.  HENT:     Head: Normocephalic.  Pulmonary:     Effort:  Pulmonary effort is normal.  Musculoskeletal:     Comments: There is tenderness to palpation in the soft tissues of the left lower lumbar region.  No bony tenderness or step-off.  Skin:    General: Skin is warm and dry.  Neurological:     Mental Status: She is alert and oriented to person, place, and time.     Comments: Strength is 5 out of 5 in both lower extremities.  DTRs are trace and symmetrical in the patellar and Achilles tendons bilaterally.     (all labs ordered are listed, but only abnormal results are displayed) Labs Reviewed - No data to display  EKG: None  Radiology: No results found.   Procedures   Medications Ordered in the ED  ketorolac  (TORADOL ) injection 60 mg (has no administration in time range)  oxyCODONE -acetaminophen  (PERCOCET/ROXICET) 5-325 MG per tablet 2 tablet (has no administration in time range)                                    Medical Decision Making Risk Prescription drug management.   Patient presenting here with complaints of low back pain as described in the HPI.  She arrives here with stable vital signs and is afebrile.  Physical examination is reassuring with no red flags that would suggest an emergent situation.  Patient given IM Toradol  along with Percocet.  She will be discharged with NSAIDs and pain medication.  She is to follow-up with primary doctor if not improving in the next week.     Final diagnoses:  None    ED Discharge Orders     None          Geroldine Berg, MD 01/05/24 [place]    Geroldine Berg, MD 01/05/24 [phone]

## 2024-01-05 NOTE — ED Notes (Signed)
RN provided AVS using Teachback Method. Patient verbalizes understanding of Discharge Instructions. Opportunity for Questioning and Answers were provided by RN. Patient Discharged from ED ambulatory to home with family. ? ?

## 2024-01-05 NOTE — ED Triage Notes (Signed)
 Patient here POV from Home.  Notes back pain for approximately 8 hours. Began when she was attempting to get her grandchild bending forward. Left sided and towards the mid lower back. No Dysuria or numbness/tingling.   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   NAD noted during Triage. A&Ox4. GCS 15. BIB Wheelchair.

## 2024-01-05 NOTE — Discharge Instructions (Signed)
 Begin taking naproxen as prescribed.  Begin taking Percocet as prescribed as needed for pain not relieved with naproxen.  Rest.  Follow-up with primary doctor if symptoms are not improving in the next week.

## 2024-01-05 NOTE — ED Notes (Signed)
 Provider at the bedside.

## 2024-01-12 DIAGNOSIS — N2889 Other specified disorders of kidney and ureter: Secondary | ICD-10-CM | POA: Diagnosis not present

## 2024-01-12 DIAGNOSIS — C50111 Malignant neoplasm of central portion of right female breast: Secondary | ICD-10-CM | POA: Diagnosis not present

## 2024-01-16 DIAGNOSIS — N281 Cyst of kidney, acquired: Secondary | ICD-10-CM | POA: Diagnosis not present

## 2024-01-16 DIAGNOSIS — Z170 Estrogen receptor positive status [ER+]: Secondary | ICD-10-CM | POA: Diagnosis not present

## 2024-01-16 DIAGNOSIS — C50111 Malignant neoplasm of central portion of right female breast: Secondary | ICD-10-CM | POA: Diagnosis not present

## 2024-01-16 DIAGNOSIS — N2889 Other specified disorders of kidney and ureter: Secondary | ICD-10-CM | POA: Diagnosis not present

## 2024-01-21 DIAGNOSIS — F411 Generalized anxiety disorder: Secondary | ICD-10-CM | POA: Diagnosis not present

## 2024-01-21 DIAGNOSIS — R799 Abnormal finding of blood chemistry, unspecified: Secondary | ICD-10-CM | POA: Diagnosis not present

## 2024-01-21 DIAGNOSIS — F419 Anxiety disorder, unspecified: Secondary | ICD-10-CM | POA: Diagnosis not present

## 2024-01-21 DIAGNOSIS — F329 Major depressive disorder, single episode, unspecified: Secondary | ICD-10-CM | POA: Diagnosis not present

## 2024-01-21 DIAGNOSIS — F900 Attention-deficit hyperactivity disorder, predominantly inattentive type: Secondary | ICD-10-CM | POA: Diagnosis not present

## 2024-01-21 DIAGNOSIS — D649 Anemia, unspecified: Secondary | ICD-10-CM | POA: Diagnosis not present

## 2024-01-21 DIAGNOSIS — E785 Hyperlipidemia, unspecified: Secondary | ICD-10-CM | POA: Diagnosis not present

## 2024-01-21 DIAGNOSIS — F319 Bipolar disorder, unspecified: Secondary | ICD-10-CM | POA: Diagnosis not present

## 2024-01-21 DIAGNOSIS — J209 Acute bronchitis, unspecified: Secondary | ICD-10-CM | POA: Diagnosis not present

## 2024-01-21 DIAGNOSIS — F410 Panic disorder [episodic paroxysmal anxiety] without agoraphobia: Secondary | ICD-10-CM | POA: Diagnosis not present

## 2024-01-21 DIAGNOSIS — E559 Vitamin D deficiency, unspecified: Secondary | ICD-10-CM | POA: Diagnosis not present

## 2024-01-21 DIAGNOSIS — I10 Essential (primary) hypertension: Secondary | ICD-10-CM | POA: Diagnosis not present

## 2024-01-21 DIAGNOSIS — R5383 Other fatigue: Secondary | ICD-10-CM | POA: Diagnosis not present

## 2024-02-04 DIAGNOSIS — F32A Depression, unspecified: Secondary | ICD-10-CM | POA: Diagnosis not present

## 2024-02-04 DIAGNOSIS — C50911 Malignant neoplasm of unspecified site of right female breast: Secondary | ICD-10-CM | POA: Diagnosis not present

## 2024-02-04 DIAGNOSIS — I10 Essential (primary) hypertension: Secondary | ICD-10-CM | POA: Diagnosis not present

## 2024-02-04 DIAGNOSIS — E782 Mixed hyperlipidemia: Secondary | ICD-10-CM | POA: Diagnosis not present

## 2024-02-04 DIAGNOSIS — F17200 Nicotine dependence, unspecified, uncomplicated: Secondary | ICD-10-CM | POA: Diagnosis not present

## 2024-02-04 DIAGNOSIS — Z170 Estrogen receptor positive status [ER+]: Secondary | ICD-10-CM | POA: Diagnosis not present

## 2024-02-04 DIAGNOSIS — F419 Anxiety disorder, unspecified: Secondary | ICD-10-CM | POA: Diagnosis not present

## 2024-02-04 DIAGNOSIS — I890 Lymphedema, not elsewhere classified: Secondary | ICD-10-CM | POA: Diagnosis not present
